# Patient Record
Sex: Male | Born: 1975 | Race: Black or African American | Hispanic: No | Marital: Married | Smoking: Current some day smoker
Health system: Southern US, Community
[De-identification: ages and names within clinical notes are randomized; demographics above are authoritative.]

## PROBLEM LIST (undated history)

## (undated) DIAGNOSIS — G709 Myoneural disorder, unspecified: Secondary | ICD-10-CM

## (undated) DIAGNOSIS — E119 Type 2 diabetes mellitus without complications: Secondary | ICD-10-CM

## (undated) DIAGNOSIS — F419 Anxiety disorder, unspecified: Secondary | ICD-10-CM

## (undated) DIAGNOSIS — Z8489 Family history of other specified conditions: Secondary | ICD-10-CM

## (undated) DIAGNOSIS — N189 Chronic kidney disease, unspecified: Secondary | ICD-10-CM

## (undated) DIAGNOSIS — I10 Essential (primary) hypertension: Secondary | ICD-10-CM

## (undated) DIAGNOSIS — G4730 Sleep apnea, unspecified: Secondary | ICD-10-CM

## (undated) DIAGNOSIS — I2699 Other pulmonary embolism without acute cor pulmonale: Secondary | ICD-10-CM

## (undated) HISTORY — DX: Other pulmonary embolism without acute cor pulmonale: I26.99

## (undated) HISTORY — DX: Type 2 diabetes mellitus without complications: E11.9

## (undated) HISTORY — PX: UMBILICAL HERNIA REPAIR: SHX196

---
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
## 2011-05-08 ENCOUNTER — Emergency Department: Payer: Self-pay | Admitting: Internal Medicine

## 2011-05-08 LAB — COMPREHENSIVE METABOLIC PANEL
Anion Gap: 8 (ref 7–16)
Calcium, Total: 10 mg/dL (ref 8.5–10.1)
Co2: 30 mmol/L (ref 21–32)
EGFR (Non-African Amer.): >60
Glucose: 128 mg/dL — ABNORMAL HIGH (ref 65–99)
Osmolality: 270 (ref 275–301)
Potassium: 3.8 mmol/L (ref 3.5–5.1)
SGPT (ALT): 273 U/L — ABNORMAL HIGH
Sodium: 135 mmol/L — ABNORMAL LOW (ref 136–145)

## 2011-05-08 LAB — CK TOTAL AND CKMB (NOT AT ARMC)
CK, Total: 388 U/L — ABNORMAL HIGH (ref 35–232)
CK-MB: 2.6 ng/mL (ref 0.5–3.6)

## 2011-05-08 LAB — CBC
MCH: 29.8 pg (ref 26.0–34.0)
MCHC: 32.7 g/dL (ref 32.0–36.0)
MCV: 91 fL (ref 80–100)
Platelet: 142 10*3/uL — ABNORMAL LOW (ref 150–440)
RDW: 13.7 % (ref 11.5–14.5)

## 2011-05-08 LAB — URINALYSIS, COMPLETE
Bacteria: NONE SEEN
Bilirubin,UR: NEGATIVE
Blood: NEGATIVE
Glucose,UR: NEGATIVE mg/dL (ref 0–75)
Hyaline Cast: 1
Ketone: NEGATIVE
Leukocyte Esterase: NEGATIVE
Nitrite: NEGATIVE
Protein: 100
Specific Gravity: 1.011 (ref 1.003–1.030)

## 2011-05-08 LAB — PROTIME-INR
INR: 1
Prothrombin Time: 13.9 secs (ref 11.5–14.7)

## 2011-05-08 LAB — DRUG SCREEN, URINE
Benzodiazepine, Ur Scrn: NEGATIVE (ref ?–200)
Cannabinoid 50 Ng, Ur ~~LOC~~: NEGATIVE (ref ?–50)
MDMA (Ecstasy)Ur Screen: NEGATIVE (ref ?–500)
Methadone, Ur Screen: NEGATIVE (ref ?–300)
Opiate, Ur Screen: NEGATIVE (ref ?–300)
Phencyclidine (PCP) Ur S: NEGATIVE (ref ?–25)

## 2011-05-08 LAB — TROPONIN I: Troponin-I: 0.03 ng/mL

## 2011-09-13 ENCOUNTER — Emergency Department: Payer: Self-pay | Admitting: *Deleted

## 2011-09-13 LAB — COMPREHENSIVE METABOLIC PANEL
BUN: 11 mg/dL (ref 7–18)
Bilirubin,Total: 0.5 mg/dL (ref 0.2–1.0)
Calcium, Total: 8.8 mg/dL (ref 8.5–10.1)
Chloride: 106 mmol/L (ref 98–107)
Co2: 25 mmol/L (ref 21–32)
Creatinine: 0.85 mg/dL (ref 0.60–1.30)
EGFR (African American): >60
EGFR (Non-African Amer.): >60
Glucose: 99 mg/dL (ref 65–99)
Potassium: 3.5 mmol/L (ref 3.5–5.1)
SGOT(AST): 134 U/L — ABNORMAL HIGH (ref 15–37)
SGPT (ALT): 216 U/L — ABNORMAL HIGH
Total Protein: 8.3 g/dL — ABNORMAL HIGH (ref 6.4–8.2)

## 2011-09-13 LAB — CBC
HCT: 46.1 % (ref 40.0–52.0)
HGB: 15.5 g/dL (ref 13.0–18.0)
MCHC: 33.5 g/dL (ref 32.0–36.0)
Platelet: 132 10*3/uL — ABNORMAL LOW (ref 150–440)
RBC: 4.98 10*6/uL (ref 4.40–5.90)
RDW: 13.6 % (ref 11.5–14.5)

## 2011-09-13 LAB — TROPONIN I: Troponin-I: 0.02 ng/mL

## 2012-03-01 ENCOUNTER — Emergency Department: Payer: Self-pay | Admitting: Emergency Medicine

## 2012-03-01 LAB — URINALYSIS, COMPLETE
Bacteria: NONE SEEN
Bilirubin,UR: NEGATIVE
Blood: NEGATIVE
Glucose,UR: NEGATIVE mg/dL (ref 0–75)
Leukocyte Esterase: NEGATIVE
RBC,UR: <1 /HPF (ref 0–5)
Squamous Epithelial: <1
WBC UR: <1 /HPF (ref 0–5)

## 2012-03-01 LAB — CBC
HGB: 14.2 g/dL (ref 13.0–18.0)
MCHC: 32.9 g/dL (ref 32.0–36.0)
MCV: 94 fL (ref 80–100)
Platelet: 123 10*3/uL — ABNORMAL LOW (ref 150–440)
RBC: 4.58 10*6/uL (ref 4.40–5.90)
RDW: 14.4 % (ref 11.5–14.5)

## 2012-03-01 LAB — COMPREHENSIVE METABOLIC PANEL
Alkaline Phosphatase: 74 U/L (ref 50–136)
BUN: 9 mg/dL (ref 7–18)
Bilirubin,Total: 0.3 mg/dL (ref 0.2–1.0)
Co2: 26 mmol/L (ref 21–32)
Creatinine: 1.18 mg/dL (ref 0.60–1.30)
EGFR (Non-African Amer.): >60
Osmolality: 291 (ref 275–301)
Potassium: 3.5 mmol/L (ref 3.5–5.1)
SGOT(AST): 163 U/L — ABNORMAL HIGH (ref 15–37)
SGPT (ALT): 181 U/L — ABNORMAL HIGH (ref 12–78)
Sodium: 146 mmol/L — ABNORMAL HIGH (ref 136–145)
Total Protein: 8.1 g/dL (ref 6.4–8.2)

## 2012-03-01 LAB — TSH: Thyroid Stimulating Horm: 3.22 u[IU]/mL

## 2012-03-01 LAB — PRO B NATRIURETIC PEPTIDE: B-Type Natriuretic Peptide: 10 pg/mL (ref 0–125)

## 2012-03-01 LAB — TROPONIN I
Troponin-I: 0.04 ng/mL
Troponin-I: 0.04 ng/mL

## 2012-03-10 ENCOUNTER — Ambulatory Visit: Payer: Self-pay | Admitting: Cardiology

## 2012-12-17 LAB — CBC AND DIFFERENTIAL
HCT: 43 % (ref 41–53)
HEMOGLOBIN: 14 g/dL (ref 13.5–17.5)
PLATELETS: 123 10*3/uL — AB (ref 150–399)
WBC: 4.8 10*3/mL

## 2012-12-17 LAB — BASIC METABOLIC PANEL
BUN: 8 mg/dL (ref 4–21)
Creatinine: 0.9 mg/dL (ref 0.6–1.3)
Glucose: 197 mg/dL
Potassium: 4 mmol/L (ref 3.4–5.3)
Sodium: 143 mmol/L (ref 137–147)

## 2012-12-17 LAB — LIPID PANEL
Cholesterol: 226 mg/dL — AB (ref 0–200)
HDL: 30 mg/dL — AB (ref 35–70)
LDL CALC: 156 mg/dL
TRIGLYCERIDES: 200 mg/dL — AB (ref 40–160)

## 2013-02-25 LAB — HEMOGLOBIN A1C: HEMOGLOBIN A1C: 6.1

## 2013-03-31 ENCOUNTER — Ambulatory Visit: Payer: Self-pay | Admitting: Anesthesiology

## 2013-03-31 LAB — BASIC METABOLIC PANEL
Anion Gap: 8 (ref 7–16)
BUN: 11 mg/dL (ref 7–18)
Calcium, Total: 10.1 mg/dL (ref 8.5–10.1)
Chloride: 99 mmol/L (ref 98–107)
Co2: 32 mmol/L (ref 21–32)
Creatinine: 0.83 mg/dL (ref 0.60–1.30)
EGFR (African American): >60
EGFR (Non-African Amer.): >60
Glucose: 141 mg/dL — ABNORMAL HIGH (ref 65–99)
Potassium: 3.3 mmol/L — ABNORMAL LOW (ref 3.5–5.1)

## 2013-04-05 ENCOUNTER — Ambulatory Visit: Payer: Self-pay | Admitting: Surgery

## 2013-07-22 ENCOUNTER — Ambulatory Visit: Payer: Self-pay | Admitting: Family Medicine

## 2014-01-19 ENCOUNTER — Emergency Department: Payer: Self-pay | Admitting: Emergency Medicine

## 2014-08-18 NOTE — Op Note (Signed)
PATIENT NAME:  Tobin, ItalyHAD A MR#:  [id number] DATE OF BIRTH:  05-16-1975  DATE OF PROCEDURE:  04/05/2013  PREOPERATIVE DIAGNOSIS: Umbilical hernia.   POSTOPERATIVE DIAGNOSIS: Umbilical hernia.   PROCEDURE: Umbilical hernia repair.   SURGEON: Renda RollsWilton Zavien Clubb, M.D.   ANESTHESIA: General.   INDICATIONS: This 39 year old male has noted increasing size of bulging at the navel over the past year. Has had some minor associated pain. A large umbilical hernia was demonstrated on physical exam and repair was recommended for definitive treatment.   DESCRIPTION OF PROCEDURE: The patient was placed on the operating table in the supine position under general anesthesia. The periumbilical skin was clipped and prepared with ChloraPrep and draped in a sterile manner.   The bulge itself was about 5 cm. A transversely oriented supraumbilical curvilinear incision was  made from approximately 9:30 position to 2:30 position and ellipse of skin was excised to help avoid redundancy. Several bleeding points were cauterized. Several traversing veins were suture ligated with 4-0 chromic. There was a large sac which was dissected free from surrounding structures and followed down to the fascial ring defect. The sac was opened and a finger was inserted to allow traction. There was some omentum within the sac which was dissected away and reduced. After separating the sac from the fascial ring defect the sac was suture ligated with 0 Surgilon. The sac was excised. It did not need to be sent for pathology, and the skin ellipse did not need to be sent for pathology. The Bard soft mesh was used and I cut out a portion of mesh to approximately 2.5 x 3.5 cm and placed transversely oriented in the properitoneal plane. The fascial defect itself was approximately 2 cm in dimension. The mesh was sutured to the fascia  above and below the fascial ring defect with a 0 Surgilon, and then the fascia was closed with a transversely oriented suture  line of interrupted 0 Surgilon simple and figure-of-eight sutures incorporating mesh into each suture. The repair looked good. Hemostasis was intact. Subcutaneous tissues were infiltrated with 0.5% Sensorcaine with epinephrine and also deeper  tissues and even into the fascia was infiltrated as well. Next, the skin of the umbilicus was sutured to the deep fascia with 4-0 chromic. The skin was closed with running 4-0 Monocryl subcuticular suture and Dermabond, and after the Dermabond dried I applied 4 x 4 gauze, 5 cotton balls, additional folded gauze and paper tape.   The patient appeared to tolerate the procedure satisfactorily and was prepared for transfer to the recovery room.    ____________________________ Shela CommonsJ. Renda RollsWilton Shyasia Funches, MD jws:sg D: 04/05/2013 09:18:11 ET T: 04/05/2013 10:02:44 ET JOB#: [id number]  cc: Adella HareJ. Wilton Samani Deal, MD, <Dictator> Adella HareWILTON J Nyree Yonker MD ELECTRONICALLY SIGNED 04/05/2013 19:29

## 2014-08-19 NOTE — Consult Note (Signed)
PATIENT NAME:  [name], [name] [name]#:  [id number] DATE OF BIRTH:  03-31-1976  DATE OF CONSULTATION:  04/05/2013  REFERRING PHYSICIAN:  Adella Hare, MD CONSULTING PHYSICIAN:  Katha Hamming, MD  PRIMARY DOCTOR:  [facility].   REASON FOR CONSULTATION:  Hypoxia.   HISTORY OF PRESENT ILLNESS: This 39 year old male patient had umbilical hernia surgery today but patient is having low oxygen saturations. Saturations are dropping to like 87% or 88% on room air on 2 liters sats are around 90% to 93% but the patient's sats are dropping to even like 90% when he talks. Denies any shortness of breath. No chest pain. No cough. No fever. The patient denies any history of heart problems. No history of COPD. The patient received breathing treatments after extubation in the OR and also received flumazenil as an antidote for benzodiazepine because of his low O2 sats. Seen in the recovery room, the patient slightly sedated, going to sleep while talking.  At that time, sats are dropping to like low 90s but when he is awake, when he takes deep breaths saturations are around 94% to  95% on 2 liters. Without oxygen, sats are 86% to 87%.  The patient does not want to stay for observation. Denies any complaints. Chest x-ray is clear. The patient's O2 sats were 93% to 94% on room air before the surgery.   PAST MEDICAL HISTORY:  Significant for hypertension.   ALLERGIES:  THE PATIENT IS ALLERGIC TO FRESH FRUITS, CAUSE NAUSEA AND THROAT TIGHTNESS.  HE SAYS HE IS ALLERGIC TO ALL FRESH FRUITS.  HE CAN TAKE CANNED FRUITS.  HE IS ALSO ALLERGIC TO PEANUTS.   SOCIAL HISTORY:  Smokes couple of cigarettes a day. No alcohol. No drug.   PAST SURGICAL HISTORY: Umbilical hernia surgery at this time.   FAMILY HISTORY: No asthma, mother had hypertension also.   SOCIAL HISTORY: As I mentioned, he has a 21-year-old son. Works here third shift.   REVIEW OF SYSTEMS: CONSTITUTIONAL: No fever. No fatigue.   GASTROINTESTINAL: No nausea. No vomiting. Has umbilical hernia surgery, complains of slight pain in the abdomen.  CARDIOVASCULAR: No chest pain or palpitations.  MUSCULOSKELETAL: No joint pains.  ENT: No tinnitus. No ear pain. No difficulty swallowing.  RESPIRATORY:  The patient denies any shortness of breath, never was evaluated for sleep apnea but the patient does admit to having episodes of apnea during the sleep.  NEUROLOGIC: No history of stroke.  PSYCHIATRIC: No anxiety or insomnia.   PHYSICAL EXAMINATION: VITAL SIGNS: Right now, heart rate is around 99, blood pressure 140/70, sats are like 93% to 94% on 2 liters.  GENERAL:  He is alert, awake, oriented, still kind of a little sleepy due to the sedation effect from anesthesia.  ENT: No tympanic membrane congestion. No turbinate hypertrophy. No pharyngeal erythema. Mucous membranes are dry.  CARDIOVASCULAR: S1, S2 regular, slightly tachycardic due to pain. The patient has good pedal pulse and carotid pulse.  LUNGS: Clear to auscultation. No wheeze. No rales.  GASTROINTESTINAL: The patient had umbilical hernia surgery, dressing present in abdomen.  EXTREMITIES: No extremity edema. No cyanosis, no clubbing.  NEUROLOGIC: Alert, awake, oriented. Cranial nerves II through XII are intact. Power 5/5 upper and lower extremities. Senses are intact. DTRs 2+ bilaterally.  PSYCHIATRIC: Mood and affect are within normal limits.   LABORATORY, DIAGNOSTIC AND RADIOLOGICAL DATA: Chest x-ray shows borderline enlargement of cardiopericardial silhouette without any congestion.  EKG normal sinus rhythm, 97 beats per minute, no ST-T changes.  Electrolytes: Sodium 139, potassium 3.3, chlorite is 99, bicarb 32, BUN 11, creatinine 0.83, glucose 141.   HOME MEDICATIONS: The patient is on chlorthalidone 25 mg p.o. daily, metoprolol 25 mg at bedtime and he is on Prilosec 20 mg.   CURRENT MEDICATIONS IN THE HOSPITAL: He is on IV fluids, Ringer's lactate at 40 and he  is also on Norco 5/325 q.4 hours p.r.n. and he is also on Zofran.   ASSESSMENT AND PLAN: The patient is a 39 year old male with hypoxia, status post recent intubation and general anesthesia for umbilical hernia repair. At this time, I do not suspect any congestive lung problem or heart problem causing hypoxia. I believe it is probably he is  recovering from medication effect and also recovering from general anesthesia. We are trying to see if he can get home oxygen set up at 2 liters by nasal cannula. The patient probably has underlying sleep apnea. That made him having hypoxic episodes during recovery period and he will benefit from sleep studies as an outpatient. I advised him [facility] once he recovers from umbilical hernia. The patient probably needs 2 liters of oxygen till he gets the appointment as his preop O2 saturations were also like 93% so it is unknown, but he probably might be having low oxygen saturations to begin with even before surgery but it is not documented and never was checked. Now, I think the low hypoxia is probably related to the medication effect and recovery  from general anesthesia and intubation. The patient's chest x-ray is clear. EKG looks normal sinus so, hopefully, with oxygen he can go home. Otherwise, he needs overnight observation see if the anesthesia effect wears off, if he maintains his O2 saturations above 90% on room air. The patient advised to use incentive spirometry and we can let him go home with incentive spirometry to use.  called back to recovery room and found out pt o2 sats improved to 95% with  out  o2 and he is discharged  TIME SPENT ON THE CONSULT: More than 60 minutes.   ____________________________ Katha HammingSnehalatha Millee Denise, MD sk:cs D: 04/05/2013 13:42:39 ET T: 04/05/2013 14:29:36 ET JOB#: [id number]  cc: Katha HammingSnehalatha Jasiel Belisle, MD, <Dictator> Katha HammingSNEHALATHA Marceil Welp MD ELECTRONICALLY SIGNED 04/30/2013  17:56

## 2014-09-15 ENCOUNTER — Encounter: Payer: Self-pay | Admitting: Emergency Medicine

## 2014-09-15 ENCOUNTER — Emergency Department: Payer: Self-pay

## 2014-09-15 ENCOUNTER — Emergency Department
Admission: EM | Admit: 2014-09-15 | Discharge: 2014-09-15 | Disposition: A | Discharge status: Home / Self Care | Payer: Self-pay | Attending: Emergency Medicine | Admitting: Emergency Medicine

## 2014-09-15 DIAGNOSIS — I10 Essential (primary) hypertension: Secondary | ICD-10-CM | POA: Insufficient documentation

## 2014-09-15 DIAGNOSIS — Z720 Tobacco use: Secondary | ICD-10-CM | POA: Insufficient documentation

## 2014-09-15 DIAGNOSIS — M25562 Pain in left knee: Secondary | ICD-10-CM | POA: Insufficient documentation

## 2014-09-15 HISTORY — DX: Essential (primary) hypertension: I10

## 2014-09-15 LAB — BASIC METABOLIC PANEL
Anion gap: 8 (ref 5–15)
BUN: 8 mg/dL (ref 6–20)
CALCIUM: 9.1 mg/dL (ref 8.9–10.3)
CO2: 30 mmol/L (ref 22–32)
Chloride: 102 mmol/L (ref 101–111)
Creatinine, Ser: 0.82 mg/dL (ref 0.61–1.24)
GFR calc non Af Amer: >60 mL/min (ref >60)
Glucose, Bld: 136 mg/dL — ABNORMAL HIGH (ref 65–99)
POTASSIUM: 3.5 mmol/L (ref 3.5–5.1)
Sodium: 140 mmol/L (ref 135–145)

## 2014-09-15 LAB — CBC
HEMATOCRIT: 44.1 % (ref 40.0–52.0)
Hemoglobin: 14.5 g/dL (ref 13.0–18.0)
MCH: 29.9 pg (ref 26.0–34.0)
MCHC: 32.9 g/dL (ref 32.0–36.0)
MCV: 90.8 fL (ref 80.0–100.0)
Platelets: 123 10*3/uL — ABNORMAL LOW (ref 150–440)
RBC: 4.86 MIL/uL (ref 4.40–5.90)
RDW: 14.4 % (ref 11.5–14.5)
WBC: 5.5 10*3/uL (ref 3.8–10.6)

## 2014-09-15 LAB — URIC ACID: Uric Acid, Serum: 6.6 mg/dL (ref 4.4–7.6)

## 2014-09-15 MED ORDER — KETOROLAC TROMETHAMINE 30 MG/ML IJ SOLN
30.0000 mg | Freq: Once | INTRAMUSCULAR | Status: AC
  Administered 2014-09-15: 30 mg via INTRAVENOUS

## 2014-09-15 MED ORDER — OXYCODONE-ACETAMINOPHEN 5-325 MG PO TABS: 1.0000 | ORAL_TABLET | ORAL | Status: DC | PRN

## 2014-09-15 MED ORDER — SODIUM CHLORIDE 0.9 % IV BOLUS (SEPSIS)
1000.0000 mL | Freq: Once | INTRAVENOUS | Status: AC
  Administered 2014-09-15: 1000 mL via INTRAVENOUS

## 2014-09-15 MED ORDER — ETODOLAC 400 MG PO TABS: 400.0000 mg | ORAL_TABLET | Freq: Two times a day (BID) | ORAL | Status: DC

## 2014-09-15 MED ORDER — KETOROLAC TROMETHAMINE 30 MG/ML IJ SOLN
INTRAMUSCULAR | Status: AC
  Administered 2014-09-15: 30 mg via INTRAVENOUS
  Filled 2014-09-15: qty 1

## 2014-09-15 MED ORDER — OXYCODONE-ACETAMINOPHEN 5-325 MG PO TABS
ORAL_TABLET | ORAL | Status: AC
  Administered 2014-09-15: 2 via ORAL
  Filled 2014-09-15: qty 2

## 2014-09-15 MED ORDER — OXYCODONE-ACETAMINOPHEN 5-325 MG PO TABS
2.0000 | ORAL_TABLET | Freq: Once | ORAL | Status: AC
  Administered 2014-09-15: 2 via ORAL

## 2014-09-15 NOTE — ED Notes (Signed)
C/o knee pain since yesterday am, denies any injury

## 2014-09-15 NOTE — ED Notes (Signed)
Pt given soda at this time

## 2014-09-15 NOTE — ED Provider Notes (Signed)
[facility] Emergency Department Provider Note  ____________________________________________  Time seen: 8:47 AM  I have reviewed the triage vital signs and the nursing notes.   HISTORY  Chief Complaint Knee Pain    HPI [name] Walker is a 39 y.o. male left knee pain since yesterday. He denies any history of injury. There is increased pain with standing slightly decreased pain with lying down. He took some over-the-counter ibuprofen without any relief. Currently his pain is 8 out of 10. He has not been aware of any fever or chills. He has had some problems with his knee in the past but no injury or surgery has been done. He also gives a history of gout but has not had any in his knee.   Past Medical History  Diagnosis Date  . Hypertension     There are no active problems to display for this patient.   No past surgical history on file.  Current Outpatient Rx  Name  Route  Sig  Dispense  Refill  . etodolac (LODINE) 400 MG tablet   Oral   Take 1 tablet (400 mg total) by mouth 2 (two) times daily.   20 tablet   0   . oxyCODONE-acetaminophen (PERCOCET) 5-325 MG per tablet   Oral   Take 1 tablet by mouth every 4 (four) hours as needed for severe pain.   20 tablet   0     Allergies Review of patient's allergies indicates no known allergies.  History reviewed. No pertinent family history.  Social History History  Substance Use Topics  . Smoking status: Current Some Day Smoker    Types: Cigarettes  . Smokeless tobacco: Not on file  . Alcohol Use: Yes    Review of Systems Constitutional: No fever/chills Eyes: No visual changes. ENT: No sore throat. Cardiovascular: Denies chest pain. Respiratory: Denies shortness of breath. Gastrointestinal: No abdominal pain.  No nausea, no vomiting.  No diarrhea.  Genitourinary: Negative for dysuria. Musculoskeletal: Negative for back pain. Skin: Negative for rash. Neurological: Negative for headaches,  focal weakness or numbness.  10-point ROS otherwise negative.  ____________________________________________   PHYSICAL EXAM:  VITAL SIGNS: ED Triage Vitals  Enc Vitals Group     BP 09/15/14 0758 169/115 mmHg     Pulse Rate 09/15/14 0758 125     Resp 09/15/14 0758 18     Temp 09/15/14 0758 99 F (37.2 C)     Temp Source 09/15/14 0758 Oral     SpO2 09/15/14 0758 95 %     Weight 09/15/14 0758 260 lb (117.935 kg)     Height 09/15/14 0758 5\' 8"  (1.727 m)     Head Cir --      Peak Flow --      Pain Score 09/15/14 0756 8     Pain Loc --      Pain Edu? --      Excl. in GC? --     Constitutional: Alert and oriented. Well appearing and in no acute distress. Eyes: Conjunctivae are normal. PERRL. EOMI. Head: Atraumatic. Nose: No congestion/rhinnorhea. Mouth/Throat: Mucous membranes are moist.  Neck: No stridor.   Cardiovascular: Normal rate, regular rhythm. Grossly normal heart sounds.  Good peripheral circulation. Respiratory: Normal respiratory effort.  No retractions. Lungs CTAB. Gastrointestinal: Soft and nontender. No distention Musculoskeletal: The left knee with moderate tenderness on palpation of the anterior aspect. Range of motion is restricted  secondary to pain. There is no erythema or abrasions noted. No effusion.  Neurologic:  Normal speech and language. No gross focal neurologic deficits are appreciated. Speech is normal. No gait instability. Skin:  Skin is warm, dry and intact. No rash noted. Psychiatric: Mood and affect are normal. Speech and behavior are normal.  ____________________________________________   LABS (all labs ordered are listed, but only abnormal results are displayed)  Labs Reviewed  CBC - Abnormal; Notable for the following:    Platelets 123 (*)    All other components within normal limits  BASIC METABOLIC PANEL - Abnormal; Notable for the following:    Glucose, Bld 136 (*)    All other components within normal limits  URIC ACID    RADIOLOGY  X-ray is negative per radiologist. ____________________________________________   PROCEDURES  Procedure(s) performed: None  Critical Care performed: No  ____________________________________________   INITIAL IMPRESSION / ASSESSMENT AND PLAN / ED COURSE  Pertinent labs & imaging results that were available during my care of the patient were reviewed by me and considered in my medical decision making (see chart for details).  Patient was given a set of crutches and also placed in a knee immobilizer. He was started on etodolac for 10 days along with a prescription for Percocet if needed for pain. He is to follow-up with Dr. Rosita KeaMenz if any continued problems. He was instructed to stay off his leg as much as possible this weekend. He is return to the emergency room if any severe worsening or urgent concerns. ____________________________________________   FINAL CLINICAL IMPRESSION(S) / ED DIAGNOSES  Final diagnoses:  Acute knee pain, left      Tommi RumpsRhonda L Summers, PA-C 09/15/14 1330  Tommi Rumpshonda L Summers, PA-C 09/15/14 1331  Governor Rooksebecca Lord, MD 09/15/14 [phone]

## 2015-02-13 ENCOUNTER — Emergency Department: Payer: Self-pay

## 2015-02-13 ENCOUNTER — Emergency Department
Admission: EM | Admit: 2015-02-13 | Discharge: 2015-02-13 | Disposition: A | Discharge status: Home / Self Care | Payer: Self-pay | Attending: Emergency Medicine | Admitting: Emergency Medicine

## 2015-02-13 DIAGNOSIS — Z791 Long term (current) use of non-steroidal anti-inflammatories (NSAID): Secondary | ICD-10-CM | POA: Insufficient documentation

## 2015-02-13 DIAGNOSIS — I10 Essential (primary) hypertension: Secondary | ICD-10-CM | POA: Insufficient documentation

## 2015-02-13 DIAGNOSIS — Z720 Tobacco use: Secondary | ICD-10-CM | POA: Insufficient documentation

## 2015-02-13 DIAGNOSIS — M10062 Idiopathic gout, left knee: Secondary | ICD-10-CM | POA: Insufficient documentation

## 2015-02-13 LAB — CBC
HCT: 49.5 % (ref 40.0–52.0)
Hemoglobin: 16.2 g/dL (ref 13.0–18.0)
MCH: 28.9 pg (ref 26.0–34.0)
MCHC: 32.8 g/dL (ref 32.0–36.0)
MCV: 88.1 fL (ref 80.0–100.0)
PLATELETS: 136 10*3/uL — AB (ref 150–440)
RBC: 5.61 MIL/uL (ref 4.40–5.90)
RDW: 14.2 % (ref 11.5–14.5)
WBC: 6.2 10*3/uL (ref 3.8–10.6)

## 2015-02-13 LAB — BASIC METABOLIC PANEL
Anion gap: 8 (ref 5–15)
BUN: 11 mg/dL (ref 6–20)
CO2: 31 mmol/L (ref 22–32)
CREATININE: 0.79 mg/dL (ref 0.61–1.24)
Calcium: 10 mg/dL (ref 8.9–10.3)
Chloride: 100 mmol/L — ABNORMAL LOW (ref 101–111)
GFR calc Af Amer: >60 mL/min (ref >60)
Glucose, Bld: 106 mg/dL — ABNORMAL HIGH (ref 65–99)
Potassium: 3.6 mmol/L (ref 3.5–5.1)
Sodium: 139 mmol/L (ref 135–145)

## 2015-02-13 LAB — URIC ACID: URIC ACID, SERUM: 8.8 mg/dL — AB (ref 4.4–7.6)

## 2015-02-13 MED ORDER — KETOROLAC TROMETHAMINE 30 MG/ML IJ SOLN
30.0000 mg | Freq: Once | INTRAMUSCULAR | Status: AC
  Administered 2015-02-13: 30 mg via INTRAVENOUS
  Filled 2015-02-13: qty 1

## 2015-02-13 MED ORDER — HYDROMORPHONE HCL 1 MG/ML IJ SOLN
1.0000 mg | Freq: Once | INTRAMUSCULAR | Status: AC
  Administered 2015-02-13: 1 mg via INTRAVENOUS
  Filled 2015-02-13: qty 1

## 2015-02-13 MED ORDER — NAPROXEN 500 MG PO TABS: 500.0000 mg | ORAL_TABLET | Freq: Two times a day (BID) | ORAL | Status: AC

## 2015-02-13 MED ORDER — OXYCODONE-ACETAMINOPHEN 5-325 MG PO TABS: 1.0000 | ORAL_TABLET | ORAL | Status: AC | PRN

## 2015-02-13 NOTE — ED Notes (Signed)
Pt presents to ED with c/o left knee pain since 02/09/15, denies known injury to left knee. Pt reports pain subsided during the weekend but has increased this morning. Reports stood up to walk to bathroom this morning but was unable to bare weight on left knee, states "it was extremely painful." Pt reports seen about 6 months ago for same but was d/c with knee brace and ibuprofen, was not give diagnosis. (+) swelling noted to left knee, (+) movement and sensation. Patient alert and oriented x 4, respirations even and unlabored, skin warm and dry.

## 2015-02-13 NOTE — ED Notes (Addendum)
Pt to triage via w/c with no distress noted; c/o left knee pain/swelling; st awoke last Friday am with such; denies any known injury; st seen here for same with no dx

## 2015-02-13 NOTE — ED Provider Notes (Signed)
[facility] Emergency Department Provider Note  ____________________________________________  Time seen: Approximately 7:10 AM  I have reviewed the triage vital signs and the nursing notes.   HISTORY  Chief Complaint Knee Pain    HPI [place] A Glance is a 39 y.o. male with a history of gout in the foot remotely presenting with 4 days of left knee pain and swelling. Patient reports no trauma, pain started 4 days ago. He has pain with ambulation but is able to bear weight. He denies any systemic symptoms including fever, nausea or vomiting, diarrhea. He denies penile discharge or IV drug abuse.He denies numbness tickling or weakness.   Past Medical History  Diagnosis Date  . Hypertension     There are no active problems to display for this patient.   No past surgical history on file.  Current Outpatient Rx  Name  Route  Sig  Dispense  Refill  . etodolac (LODINE) 400 MG tablet   Oral   Take 1 tablet (400 mg total) by mouth 2 (two) times daily.   20 tablet   0   . naproxen (NAPROSYN) 500 MG tablet   Oral   Take 1 tablet (500 mg total) by mouth 2 (two) times daily with a meal.   40 tablet   0   . oxyCODONE-acetaminophen (ROXICET) 5-325 MG tablet   Oral   Take 1 tablet by mouth every 4 (four) hours as needed for moderate pain.   20 tablet   0     Allergies Review of patient's allergies indicates no known allergies.  No family history on file.  Social History Social History  Substance Use Topics  . Smoking status: Current Some Day Smoker    Types: Cigarettes  . Smokeless tobacco: Not on file  . Alcohol Use: Yes    Review of Systems Constitutional: No fever/chills. No lightheadedness or syncope. Eyes: No visual changes. ENT: No sore throat. Cardiovascular: Denies chest pain, palpitations. Respiratory: Denies shortness of breath.  No cough. Gastrointestinal: No abdominal pain.  No nausea, no vomiting.  No diarrhea.  No  constipation. Genitourinary: Negative for dysuria. Negative for penile discharge Musculoskeletal: Negative for back pain. Positive for left knee pain and swelling. Negative for knee erythema. Skin: Negative for rash. Neurological: Negative for headaches, focal weakness or numbness.  10-point ROS otherwise negative.  ____________________________________________   PHYSICAL EXAM:  VITAL SIGNS: ED Triage Vitals  Enc Vitals Group     BP 02/13/15 0438 138/94 mmHg     Pulse Rate 02/13/15 0438 120     Resp 02/13/15 0438 18     Temp 02/13/15 0438 97.9 F (36.6 C)     Temp Source 02/13/15 0438 Oral     SpO2 02/13/15 0438 97 %     Weight 02/13/15 0438 270 lb (122.471 kg)     Height 02/13/15 0438  (1.727 m)     Head Cir --      Peak Flow --      Pain Score 02/13/15 0438 8     Pain Loc --      Pain Edu? --      Excl. in GC? --     Constitutional: Alert and oriented. Well appearing and in no acute distress. Answer question appropriately. Eyes: Conjunctivae are normal.  EOMI. Head: Atraumatic. Nose: No congestion/rhinnorhea. Mouth/Throat: Mucous membranes are moist.  Neck: No stridor.  Supple.   Cardiovascular: Normal rate, regular rhythm. No murmurs, rubs or gallops.  Respiratory: Normal respiratory  effort.  No retractions. Lungs CTAB.  No wheezes, rales or ronchi. Musculoskeletal: Left knee has a moderate effusion with overlying warmth but no erythema. He has full range of motion of the left ankle and hip without pain. He has pain with range of motion of the left knee. Neurologic:  Normal speech and language. No gross focal neurologic deficits are appreciated.  Skin:  Skin is warm, dry and intact. No rash noted. Psychiatric: Mood and affect are normal. Speech and behavior are normal.  Normal judgement.  ____________________________________________   LABS (all labs ordered are listed, but only abnormal results are displayed)  Labs Reviewed  CBC - Abnormal; Notable for the  following:    Platelets 136 (*)    All other components within normal limits  BASIC METABOLIC PANEL - Abnormal; Notable for the following:    Chloride 100 (*)    Glucose, Bld 106 (*)    All other components within normal limits  URIC ACID - Abnormal; Notable for the following:    Uric Acid, Serum 8.8 (*)    All other components within normal limits   ____________________________________________  EKG  Not indicated ____________________________________________  RADIOLOGY  Dg Knee Complete 4 Views Left  02/13/2015  CLINICAL DATA:  Acute onset of left knee pain and swelling. Unable to bear weight without pain. Initial encounter. EXAM: LEFT KNEE - COMPLETE 4+ VIEW COMPARISON:  None. FINDINGS: There is no evidence of fracture or dislocation. The joint spaces are preserved. No significant degenerative change is seen; the patellofemoral joint is grossly unremarkable in appearance. A relatively large knee joint effusion is noted. The visualized soft tissues are normal in appearance. IMPRESSION: 1. No evidence of fracture or dislocation. 2. Relatively large knee joint effusion noted. If the patient's symptoms persist, MRI would be helpful for further evaluation, to assess for internal derangement. Electronically Signed   By: Roanna Raider M.D.   On: 02/13/2015 05:43    ____________________________________________   PROCEDURES  Procedure(s) performed: None  Critical Care performed: No ____________________________________________   INITIAL IMPRESSION / ASSESSMENT AND PLAN / ED COURSE  Pertinent labs & imaging results that were available during my care of the patient were reviewed by me and considered in my medical decision making (see chart for details).  40 y.o. male with a history of gout presenting with left knee swelling, pain, and warmth for 4 days. On my exam, the patient is afebrile and nontoxic appearing. He does have pain with range of motion of the knee but is able to bear  weight. The most likely diagnosis is gout of the left knee. Consider a ligamentous or meniscal related effusion but he has no trauma that would support this. Consider osteoarthritis especially given his morbid obesity. His symptoms at this time are not consistent with septic arthritis.  ----------------------------------------- 8:42 AM on 02/13/2015 -----------------------------------------  Patient per pain has significantly improved. He arrived and pain was 9 out of 10; it is now 3 out of 10. He does not have an elevated white blood cell count. I am awaiting the rest of his labs but if they are also reassuring I will plan to send him home with treatment for acute gout flare. We have discussed return precautions and follow-up instructions. ____________________________________________  FINAL CLINICAL IMPRESSION(S) / ED DIAGNOSES  Final diagnoses:  Acute idiopathic gout of left knee      NEW MEDICATIONS STARTED DURING THIS VISIT:  New Prescriptions   NAPROXEN (NAPROSYN) 500 MG TABLET    Take 1 tablet (  500 mg total) by mouth 2 (two) times daily with a meal.   OXYCODONE-ACETAMINOPHEN (ROXICET) 5-325 MG TABLET    Take 1 tablet by mouth every 4 (four) hours as needed for moderate pain.     Rockne MenghiniAnne-Caroline Indiana Gamero, MD 02/13/15 [phone]

## 2015-02-13 NOTE — ED Notes (Signed)
NAD noted at this time. Pt taken to lobby via wheelchair by his wife. Pt denies comments/conerns. MD aware of pt's BP.

## 2015-02-13 NOTE — Discharge Instructions (Signed)
Please return to the emergency department if you develop increased swelling, pain, fever, nausea or vomiting, inability to walk, or any other symptoms concerning to you.  Gout Gout is an inflammatory arthritis caused by a buildup of uric acid crystals in the joints. Uric acid is a chemical that is normally present in the blood. When the level of uric acid in the blood is too high it can form crystals that deposit in your joints and tissues. This causes joint redness, soreness, and swelling (inflammation). Repeat attacks are common. Over time, uric acid crystals can form into masses (tophi) near a joint, destroying bone and causing disfigurement. Gout is treatable and often preventable. CAUSES  The disease begins with elevated levels of uric acid in the blood. Uric acid is produced by your body when it breaks down a naturally found substance called purines. Certain foods you eat, such as meats and fish, contain high amounts of purines. Causes of an elevated uric acid level include:  Being passed down from parent to child (heredity).  Diseases that cause increased uric acid production (such as obesity, psoriasis, and certain cancers).  Excessive alcohol use.  Diet, especially diets rich in meat and seafood.  Medicines, including certain cancer-fighting medicines (chemotherapy), water pills (diuretics), and aspirin.  Chronic kidney disease. The kidneys are no longer able to remove uric acid well.  Problems with metabolism. Conditions strongly associated with gout include:  Obesity.  High blood pressure.  High cholesterol.  Diabetes. Not everyone with elevated uric acid levels gets gout. It is not understood why some people get gout and others do not. Surgery, joint injury, and eating too much of certain foods are some of the factors that can lead to gout attacks. SYMPTOMS   An attack of gout comes on quickly. It causes intense pain with redness, swelling, and warmth in a joint.  Fever  can occur.  Often, only one joint is involved. Certain joints are more commonly involved:  Base of the big toe.  Knee.  Ankle.  Wrist.  Finger. Without treatment, an attack usually goes away in a few days to weeks. Between attacks, you usually will not have symptoms, which is different from many other forms of arthritis. DIAGNOSIS  Your caregiver will suspect gout based on your symptoms and exam. In some cases, tests may be recommended. The tests may include:  Blood tests.  Urine tests.  X-rays.  Joint fluid exam. This exam requires a needle to remove fluid from the joint (arthrocentesis). Using a microscope, gout is confirmed when uric acid crystals are seen in the joint fluid. TREATMENT  There are two phases to gout treatment: treating the sudden onset (acute) attack and preventing attacks (prophylaxis).  Treatment of an Acute Attack.  Medicines are used. These include anti-inflammatory medicines or steroid medicines.  An injection of steroid medicine into the affected joint is sometimes necessary.  The painful joint is rested. Movement can worsen the arthritis.  You may use warm or cold treatments on painful joints, depending which works best for you.  Treatment to Prevent Attacks.  If you suffer from frequent gout attacks, your caregiver may advise preventive medicine. These medicines are started after the acute attack subsides. These medicines either help your kidneys eliminate uric acid from your body or decrease your uric acid production. You may need to stay on these medicines for a very long time.  The early phase of treatment with preventive medicine can be associated with an increase in acute gout attacks. For  this reason, during the first few months of treatment, your caregiver may also advise you to take medicines usually used for acute gout treatment. Be sure you understand your caregiver's directions. Your caregiver may make several adjustments to your medicine  dose before these medicines are effective.  Discuss dietary treatment with your caregiver or dietitian. Alcohol and drinks high in sugar and fructose and foods such as meat, poultry, and seafood can increase uric acid levels. Your caregiver or dietitian can advise you on drinks and foods that should be limited. HOME CARE INSTRUCTIONS   Do not take aspirin to relieve pain. This raises uric acid levels.  Only take over-the-counter or prescription medicines for pain, discomfort, or fever as directed by your caregiver.  Rest the joint as much as possible. When in bed, keep sheets and blankets off painful areas.  Keep the affected joint raised (elevated).  Apply warm or cold treatments to painful joints. Use of warm or cold treatments depends on which works best for you.  Use crutches if the painful joint is in your leg.  Drink enough fluids to keep your urine clear or pale yellow. This helps your body get rid of uric acid. Limit alcohol, sugary drinks, and fructose drinks.  Follow your dietary instructions. Pay careful attention to the amount of protein you eat. Your daily diet should emphasize fruits, vegetables, whole grains, and fat-free or low-fat milk products. Discuss the use of coffee, vitamin C, and cherries with your caregiver or dietitian. These may be helpful in lowering uric acid levels.  Maintain a healthy body weight. SEEK MEDICAL CARE IF:   You develop diarrhea, vomiting, or any side effects from medicines.  You do not feel better in 24 hours, or you are getting worse. SEEK IMMEDIATE MEDICAL CARE IF:   Your joint becomes suddenly more tender, and you have chills or a fever. MAKE SURE YOU:   Understand these instructions.  Will watch your condition.  Will get help right away if you are not doing well or get worse.   This information is not intended to replace advice given to you by your health care provider. Make sure you discuss any questions you have with your health  care provider.   Document Released: 04/11/2000 Document Revised: 05/05/2014 Document Reviewed: 11/26/2011 Elsevier Interactive Patient Education [employer].

## 2015-10-31 DIAGNOSIS — I10 Essential (primary) hypertension: Secondary | ICD-10-CM | POA: Insufficient documentation

## 2015-10-31 DIAGNOSIS — E785 Hyperlipidemia, unspecified: Secondary | ICD-10-CM | POA: Insufficient documentation

## 2015-10-31 DIAGNOSIS — E1159 Type 2 diabetes mellitus with other circulatory complications: Secondary | ICD-10-CM | POA: Insufficient documentation

## 2015-10-31 DIAGNOSIS — E119 Type 2 diabetes mellitus without complications: Secondary | ICD-10-CM

## 2015-10-31 DIAGNOSIS — I152 Hypertension secondary to endocrine disorders: Secondary | ICD-10-CM | POA: Insufficient documentation

## 2015-10-31 DIAGNOSIS — Q792 Exomphalos: Secondary | ICD-10-CM | POA: Insufficient documentation

## 2015-10-31 DIAGNOSIS — E1121 Type 2 diabetes mellitus with diabetic nephropathy: Secondary | ICD-10-CM | POA: Insufficient documentation

## 2015-10-31 DIAGNOSIS — E1122 Type 2 diabetes mellitus with diabetic chronic kidney disease: Secondary | ICD-10-CM | POA: Insufficient documentation

## 2015-10-31 DIAGNOSIS — K429 Umbilical hernia without obstruction or gangrene: Secondary | ICD-10-CM | POA: Insufficient documentation

## 2015-10-31 DIAGNOSIS — G4733 Obstructive sleep apnea (adult) (pediatric): Secondary | ICD-10-CM | POA: Insufficient documentation

## 2016-05-05 ENCOUNTER — Ambulatory Visit (INDEPENDENT_AMBULATORY_CARE_PROVIDER_SITE_OTHER): Payer: BC Managed Care – PPO | Admitting: Family Medicine

## 2016-05-05 ENCOUNTER — Encounter: Payer: Self-pay | Admitting: Family Medicine

## 2016-05-05 VITALS — BP 130/90 | HR 114 | Temp 98.2°F | Resp 16 | Wt 301.4 lb

## 2016-05-05 DIAGNOSIS — K429 Umbilical hernia without obstruction or gangrene: Secondary | ICD-10-CM | POA: Diagnosis not present

## 2016-05-05 DIAGNOSIS — E782 Mixed hyperlipidemia: Secondary | ICD-10-CM

## 2016-05-05 DIAGNOSIS — M545 Low back pain, unspecified: Secondary | ICD-10-CM

## 2016-05-05 DIAGNOSIS — R351 Nocturia: Secondary | ICD-10-CM

## 2016-05-05 DIAGNOSIS — R739 Hyperglycemia, unspecified: Secondary | ICD-10-CM | POA: Diagnosis not present

## 2016-05-05 DIAGNOSIS — G8929 Other chronic pain: Secondary | ICD-10-CM | POA: Diagnosis not present

## 2016-05-05 DIAGNOSIS — F419 Anxiety disorder, unspecified: Secondary | ICD-10-CM | POA: Diagnosis not present

## 2016-05-05 MED ORDER — BUSPIRONE HCL 15 MG PO TABS: ORAL_TABLET | ORAL | 1 refills | Status: DC

## 2016-05-05 NOTE — Progress Notes (Signed)
Subjective:     Patient ID: [name] Walker, male   DOB: April 16, 1976, 41 y.o.   MRN: [id number]  HPI  Chief Complaint  Patient presents with  . Back Pain    Patient comes in office today with concerns of lumbar pain for the past several weeks. Patient states that he also believes that his hernia has returned. Patient states he had surgery on his hernia before but was unsuccessful  . Anxiety    Patient would like to address symptoms of anxiety for the past 6 months. Patient states that he recently Radiographer, therapeuticgraduated barber school and is still adjusting to being in shop. Patient also reports that he has symptoms of anxiety when driving.   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 Reports anxiety sx (shortness of breath/sweating) while cutting hair and when driving at times. Has not been on medication for depression or anxiety in the past. Has a newborn along with a 39 year old. He is accompanied by his wife today. He has been treating his back pain/stiffness/spasms with ibuprofen 600 mg.twice daily and Tylenol.   Review of Systems  Respiratory:       States he is seeing an ENT this week for snoring and pauses in breathing while sleeping. Admits to daytime drowsiness.  Gastrointestinal:       Painless umbilical hernia.  Genitourinary:       Reports nocturia up to 6 x night.  Musculoskeletal:       Hx of mild L1 compression fx after a fall in October of last year. CT scan also demonstrated arthritic changes and posterior disc herniations at L3-L4 and L4-L5.  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 No distress.  Abdominal:  small non-tender protruding umbilical hernia.  Musculoskeletal:  Muscle strength in lower extremities 5/5. SLR's to 90 degrees without radiation of back pain. Localizes pain across his bilateral lumbar area.        Assessment:    1. Chronic bilateral low back pain without sciatica  2. Umbilical hernia without obstruction and without gangrene  3. Acute anxiety - busPIRone (BUSPAR) 15 MG tablet; 1/2  Pill twice daily for a week then a whole pill twice daily.  Dispense: 30 tablet; Refill: 1  4. Blood glucose elevated - Comprehensive metabolic panel  5. Mixed hyperlipidemia - Lipid panel  6. Nocturia - PSA    Plan:    Discussed use of nsaid's and Tylenol. Further f/u pending lab results and in two weeks .Discussed walking 30 minutes daily for weight management and to ameliorate many of his medical problems.

## 2016-05-05 NOTE — Patient Instructions (Addendum)
We will call you with the lab results. Use ibuprofen 600 mg. 3 x day with food. May add Tylenol up to 3000 mg./day.

## 2016-05-08 LAB — COMPREHENSIVE METABOLIC PANEL
ALK PHOS: 79 IU/L (ref 39–117)
ALT: 31 IU/L (ref 0–44)
AST: 29 IU/L (ref 0–40)
Albumin/Globulin Ratio: 1.3 (ref 1.2–2.2)
Albumin: 4.3 g/dL (ref 3.5–5.5)
BILIRUBIN TOTAL: 0.6 mg/dL (ref 0.0–1.2)
BUN/Creatinine Ratio: 12 (ref 9–20)
BUN: 11 mg/dL (ref 6–20)
CO2: 28 mmol/L (ref 18–29)
CREATININE: 0.9 mg/dL (ref 0.76–1.27)
Calcium: 9.5 mg/dL (ref 8.7–10.2)
Chloride: 98 mmol/L (ref 96–106)
GFR calc Af Amer: 125 mL/min/{1.73_m2} (ref >59)
GFR calc non Af Amer: 108 mL/min/{1.73_m2} (ref >59)
GLOBULIN, TOTAL: 3.4 g/dL (ref 1.5–4.5)
Glucose: 174 mg/dL — ABNORMAL HIGH (ref 65–99)
POTASSIUM: 3.9 mmol/L (ref 3.5–5.2)
SODIUM: 141 mmol/L (ref 134–144)
Total Protein: 7.7 g/dL (ref 6.0–8.5)

## 2016-05-08 LAB — PSA: Prostate Specific Ag, Serum: 1.6 ng/mL (ref 0.0–4.0)

## 2016-05-08 LAB — LIPID PANEL
CHOLESTEROL TOTAL: 207 mg/dL — AB (ref 100–199)
Chol/HDL Ratio: 5.2 ratio units — ABNORMAL HIGH (ref 0.0–5.0)
HDL: 40 mg/dL (ref >39)
LDL Calculated: 149 mg/dL — ABNORMAL HIGH (ref 0–99)
TRIGLYCERIDES: 92 mg/dL (ref 0–149)
VLDL Cholesterol Cal: 18 mg/dL (ref 5–40)

## 2016-05-19 ENCOUNTER — Ambulatory Visit (INDEPENDENT_AMBULATORY_CARE_PROVIDER_SITE_OTHER): Payer: BC Managed Care – PPO | Admitting: Family Medicine

## 2016-05-19 ENCOUNTER — Encounter: Payer: Self-pay | Admitting: Family Medicine

## 2016-05-19 VITALS — BP 118/84 | HR 125 | Temp 98.8°F | Resp 17 | Wt 307.0 lb

## 2016-05-19 DIAGNOSIS — R0683 Snoring: Secondary | ICD-10-CM | POA: Diagnosis not present

## 2016-05-19 DIAGNOSIS — R739 Hyperglycemia, unspecified: Secondary | ICD-10-CM

## 2016-05-19 LAB — POCT GLYCOSYLATED HEMOGLOBIN (HGB A1C)

## 2016-05-19 NOTE — Progress Notes (Signed)
Subjective:     Patient ID: [place]had A Rebel, male   DOB: [birth year], 41 y.o.   MRN: [id number]  HPI  Chief Complaint  Patient presents with  . Anxiety    Patient comes in office today for 2 week follow up, last office visit 05/05/16 patient was started on Buspar 15mg . Patient states that he is now taking a whole tablet and has noticed improvement especially in work environment staying more focused.   . Abnormal Lab    Last office visit 05/05/16 Lipid Panel and CMP panel showed elevated labs in total cholesterol, LDL cholesterol and glucouse high of 174.  . Nicotine Dependence    Patient would like to discuss quitting smoking due to shortness of breath.   Reports occasional social smoking. Pending ENT appointment for snoring/possible sleep apnea. Has thought about returning to the gym but was concerned about his umbilical hernia limiting his activity. "I put on weight during my wife's pregnancies."   Review of Systems     Objective:   Physical Exam  Constitutional: He appears well-developed and well-nourished. No distress.       Assessment:    1. Blood glucose elevated - POCT glycosylated hemoglobin (Hb A1C)  2. Snores    Plan:    Provided with handout regarding [facility]di[facility] class [facility]. Encouraged 30 minutes of aerobic exercise. Suggested 10% weight loss as his weight gain is driving most of his medical problems. F/u with ENT. Stop social smoking but continue buspirone.

## 2016-05-19 NOTE — Patient Instructions (Signed)
Do go to the pre-diabetes class. Start exercise program (walking 30 minutes daily/cardio). Call ear nose and throat about your appointment. Stop social smoking but continue medication for anxiety.

## 2016-05-21 ENCOUNTER — Ambulatory Visit: Payer: BC Managed Care – PPO | Attending: Otolaryngology

## 2016-05-21 DIAGNOSIS — G4733 Obstructive sleep apnea (adult) (pediatric): Secondary | ICD-10-CM | POA: Insufficient documentation

## 2016-05-21 DIAGNOSIS — R0683 Snoring: Secondary | ICD-10-CM | POA: Insufficient documentation

## 2016-06-06 ENCOUNTER — Telehealth: Payer: Self-pay | Admitting: Family Medicine

## 2016-06-06 NOTE — Telephone Encounter (Signed)
Pt has been x 2 days. Cough (mostly dry) and sore throat Wife has not checked temperature, and pt has not c/o chills, sweats or body aches. Please review. Allene DillonEmily Drozdowski, CMA

## 2016-06-06 NOTE — Telephone Encounter (Signed)
Pt's wife called thinking maybe her husband might have the flu per what ENT told her. Marland Kitchen. He is on a cpap machine.  They called the ENT and they suggested that he call us.  He has sore throat, eyes reds.   Left eye especially, no fever.  We have no appts.  Please advise [phone]   Thank sTeri

## 2016-06-06 NOTE — Telephone Encounter (Signed)
Discussed with his wife application of warm eye compresses and use of Mucinex D and Delsym. Will f/u if not improving over the next few days. Discussed Saturday clinic.

## 2016-08-05 ENCOUNTER — Encounter: Payer: Self-pay | Admitting: Family Medicine

## 2016-08-05 ENCOUNTER — Ambulatory Visit (INDEPENDENT_AMBULATORY_CARE_PROVIDER_SITE_OTHER): Payer: BC Managed Care – PPO | Admitting: Family Medicine

## 2016-08-05 VITALS — BP 160/100 | HR 64 | Temp 98.5°F | Resp 16 | Ht 69.0 in | Wt 296.0 lb

## 2016-08-05 DIAGNOSIS — M7061 Trochanteric bursitis, right hip: Secondary | ICD-10-CM

## 2016-08-05 DIAGNOSIS — F419 Anxiety disorder, unspecified: Secondary | ICD-10-CM | POA: Diagnosis not present

## 2016-08-05 DIAGNOSIS — M545 Low back pain, unspecified: Secondary | ICD-10-CM | POA: Insufficient documentation

## 2016-08-05 DIAGNOSIS — G8929 Other chronic pain: Secondary | ICD-10-CM | POA: Diagnosis not present

## 2016-08-05 MED ORDER — BUSPIRONE HCL 15 MG PO TABS: ORAL_TABLET | ORAL | 5 refills | Status: DC

## 2016-08-05 MED ORDER — HYDROCODONE-ACETAMINOPHEN 5-325 MG PO TABS: ORAL_TABLET | ORAL | 0 refills | Status: DC

## 2016-08-05 MED ORDER — PREDNISONE 10 MG PO TABS: ORAL_TABLET | ORAL | 0 refills | Status: DC

## 2016-08-05 NOTE — Progress Notes (Signed)
Subjective:     Patient ID: [name] Walker, male   DOB: 04-20-1976, 41 y.o.   MRN: [id number]  HPI  Chief Complaint  Patient presents with  . Back Pain    pt c/o back and hip pain times several weeks. Patient reports symptoms have been present since fracture in 02/2016. Patient reports he is taking Ibuprofen 600 mg BID, reports mild to moderate improvement.  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 He has been using C-Pap per recent sleep study. Bp at last office visit was normal so suspect elevation today is pain induced. Accompanied by his wife today.   Review of Systems  Psychiatric/Behavioral:       States buspirone helped him and he wishes to have it refilled. Was prescribed Ambien by Dr. Elenore Rota.       Objective:   Physical Exam  Constitutional: He appears well-developed and well-nourished. No distress.  Musculoskeletal:  Muscle strength in lower extremities 5/5. SLR's to 60 degrees (hamstring tightness) without radiation of back pain.Tender over his right trochanteric bursa area.       Assessment:    1. Trochanteric bursitis of right hip - predniSONE (DELTASONE) 10 MG tablet; Taper daily as follows: 6 pills, 5, 4, 3, 2, 1  Dispense: 21 tablet; Refill: 0 - HYDROcodone-acetaminophen (NORCO/VICODIN) 5-325 MG tablet; One every 4-6 hours as needed for pain. [place] CSRS reviewed.  Dispense: 20 tablet; Refill: 0 - Ambulatory referral to Orthopedic Surgery  2. Acute anxiety - busPIRone (BUSPAR) 15 MG tablet; One pill twice daily  Dispense: 60 tablet; Refill: 5  3. Chronic bilateral low back pain without sciatica - HYDROcodone-acetaminophen (NORCO/VICODIN) 5-325 MG tablet; One every 4-6 hours as needed for pain.  CSRS reviewed.  Dispense: 20 tablet; Refill: 0 - Ambulatory referral to Orthopedic Surgery    Plan:    Further f/u in 3 weeks. Will check A1C at that time. Discussed not taking Ambien and hydrocodone  together.

## 2016-08-05 NOTE — Patient Instructions (Signed)
We will call you with the orthopedic referral.

## 2016-08-18 ENCOUNTER — Ambulatory Visit: Payer: BC Managed Care – PPO | Admitting: Family Medicine

## 2016-08-18 DIAGNOSIS — M25559 Pain in unspecified hip: Secondary | ICD-10-CM | POA: Insufficient documentation

## 2016-08-18 DIAGNOSIS — M5416 Radiculopathy, lumbar region: Secondary | ICD-10-CM | POA: Insufficient documentation

## 2016-09-08 ENCOUNTER — Encounter: Payer: Self-pay | Admitting: Family Medicine

## 2016-09-08 ENCOUNTER — Ambulatory Visit (INDEPENDENT_AMBULATORY_CARE_PROVIDER_SITE_OTHER): Payer: BC Managed Care – PPO | Admitting: Family Medicine

## 2016-09-08 VITALS — BP 144/112 | HR 122 | Temp 98.6°F | Resp 17 | Wt 311.4 lb

## 2016-09-08 DIAGNOSIS — I10 Essential (primary) hypertension: Secondary | ICD-10-CM

## 2016-09-08 DIAGNOSIS — F419 Anxiety disorder, unspecified: Secondary | ICD-10-CM

## 2016-09-08 DIAGNOSIS — R0789 Other chest pain: Secondary | ICD-10-CM

## 2016-09-08 DIAGNOSIS — E782 Mixed hyperlipidemia: Secondary | ICD-10-CM | POA: Diagnosis not present

## 2016-09-08 DIAGNOSIS — M545 Low back pain: Secondary | ICD-10-CM | POA: Diagnosis not present

## 2016-09-08 DIAGNOSIS — G8929 Other chronic pain: Secondary | ICD-10-CM

## 2016-09-08 MED ORDER — HYDROCHLOROTHIAZIDE 25 MG PO TABS: 25.0000 mg | ORAL_TABLET | Freq: Every day | ORAL | 3 refills | Status: DC

## 2016-09-08 NOTE — Patient Instructions (Addendum)
Fat and Cholesterol Restricted Diet Getting too much fat and cholesterol in your diet may cause health problems. Following this diet helps keep your fat and cholesterol at normal levels. This can keep you from getting sick. What types of fat should I choose?  Choose monosaturated and polyunsaturated fats. These are found in foods such as olive oil, canola oil, flaxseeds, walnuts, almonds, and seeds.  Eat more omega-3 fats. Good choices include salmon, mackerel, sardines, tuna, flaxseed oil, and ground flaxseeds.  Limit saturated fats. These are in animal products such as meats, butter, and cream. They can also be in plant products such as palm oil, palm kernel oil, and coconut oil.  Avoid foods with partially hydrogenated oils in them. These contain trans fats. Examples of foods that have trans fats are stick margarine, some tub margarines, cookies, crackers, and other baked goods. What general guidelines do I need to follow?  Check food labels. Look for the words "trans fat" and "saturated fat."  When preparing a meal:  Fill half of your plate with vegetables and green salads.  Fill one fourth of your plate with whole grains. Look for the word "whole" as the first word in the ingredient list.  Fill one fourth of your plate with lean protein foods.  Eat more foods that have fiber, like apples, carrots, beans, peas, and barley.  Eat more home-cooked foods. Eat less at restaurants and buffets.  Limit or avoid alcohol.  Limit foods high in starch and sugar.  Limit fried foods.  Cook foods without frying them. Baking, boiling, grilling, and broiling are all great options.  Lose weight if you are overweight. Losing even a small amount of weight can help your overall health. It can also help prevent diseases such as diabetes and heart disease. What foods can I eat? Grains  Whole grains, such as whole wheat or whole grain breads, crackers, cereals, and pasta. Unsweetened oatmeal,  bulgur, barley, quinoa, or [place]ajda rice. Corn or whole wheat flour tortillas. Vegetables  Fresh or frozen vegetables (raw, steamed, roasted, or grilled). Green salads. Fruits  All fresh, canned (in natural juice), or frozen fruits. Meat and Other Protein Products  Ground beef (85% or leaner), grass-fed beef, or beef trimmed of fat. Skinless chicken or turkey. Ground chicken or turkey. Pork trimmed of fat. All fish and seafood. Eggs. Dried beans, peas, or lentils. Unsalted nuts or seeds. Unsalted canned or dry beans. Dairy  Low-fat dairy products, such as skim or 1% milk, 2% or reduced-fat cheeses, low-fat ricotta or cottage cheese, or plain low-fat yogurt. Fats and Oils  Tub margarines without trans fats. Light or reduced-fat mayonnaise and salad dressings. Avocado. Olive, canola, sesame, or safflower oils. Natural peanut or almond butter (choose ones without added sugar and oil). The items listed above may not be a complete list of recommended foods or beverages. Contact your dietitian for more options.  What foods are not recommended? Grains  White bread. White pasta. White rice. Cornbread. Bagels, pastries, and croissants. Crackers that contain trans fat. Vegetables  White potatoes. Corn. Creamed or fried vegetables. Vegetables in a cheese sauce. Fruits  Dried fruits. Canned fruit in light or heavy syrup. Fruit juice. Meat and Other Protein Products  Fatty cuts of meat. Ribs, chicken wings, bacon, sausage, bologna, salami, chitterlings, fatback, hot dogs, bratwurst, and packaged luncheon meats. Liver and organ meats. Dairy  Whole or 2% milk, cream, half-and-half, and cream cheese. Whole milk cheeses. Whole-fat or sweetened yogurt. Full-fat cheeses. Nondairy creamers and whipped   toppings. Processed cheese, cheese spreads, or cheese curds. Sweets and Desserts  Corn syrup, sugars, honey, and molasses. Candy. Jam and jelly. Syrup. Sweetened cereals. Cookies, pies, cakes, donuts, muffins, and ice  cream. Fats and Oils  Butter, stick margarine, lard, shortening, ghee, or bacon fat. Coconut, palm kernel, or palm oils. Beverages  Alcohol. Sweetened drinks (such as sodas, lemonade, and fruit drinks or punches). The items listed above may not be a complete list of foods and beverages to avoid. Contact your dietitian for more information.  This information is not intended to replace advice given to you by your health care provider. Make sure you discuss any questions you have with your health care provider. Document Released: 10/14/2011 Document Revised: 12/20/2015 Document Reviewed: 07/14/2013 Elsevier Interactive Patient Education  2017 Elsevier Inc[employer]  Consider elevating the head of your bed on 6 inch blocks and take Zantac 150 mg at bedtime.

## 2016-09-08 NOTE — Progress Notes (Signed)
Subjective:     Patient ID: [place]had A Kowaleski, male   DOB: June 10, 1975, 41 y.o.   MRN: [id number]  HPI  Chief Complaint  Patient presents with  . Anxiety    Patient returns to office today for follow up visit, last office visit that 08/05/16. At last office visity we advised tyhat patient continue Buspar 15mg . Patient states that he has good compliance and tolerance on medication, symptom control is improved. Patient states that things have been going well at work and has had no new stressors.   . Chest Pain    Patiewnt reports chest tightness and shortness of breath intermittent, patient can not give an exact date or time frame when symptom occurs. Patient reports that he has noticed swelling in his extremitites from time to time and believes that it may be do to his blood pressure.   States he will wake up at night and feel his chest is tight with shortness of breath Resolves spontaneously by sitting up and taking deep breathes. Weight is increased from last office visit. He has seen orthopedics regarding his chronic back pain and was recommended physical therapy. States it was too expensive to start. Reports being provided diet ideas but has not initiated those. Remains on C-Pap and takes Ambien as needed.   Review of Systems     Objective:   Physical Exam  Constitutional: He appears well-developed and well-nourished. No distress.  Cardiovascular: Regular rhythm.  Tachycardia present.   Pulmonary/Chest: Breath sounds normal.  Musculoskeletal: He exhibits no edema (of lower extremities).       Assessment:    1. Essential (primary) hypertension - hydrochlorothiazide (HYDRODIURIL) 25 MG tablet; Take 1 tablet (25 mg total) by mouth daily.  Dispense: 90 tablet; Refill: 3  2. Acute anxiety: stable on current medication  3. Mixed hyperlipidemia  4. Chronic bilateral low back pain without sciatica: per orthopedics  5. Chest discomfort: ? Reflux equivalent    Plan:    Discussed H.O.B.  elevation and Zantac at bedtime. Dietary information provided. Patient aware that weight is driving most of his medical problems.

## 2016-09-29 ENCOUNTER — Telehealth: Payer: Self-pay | Admitting: Family Medicine

## 2016-09-29 ENCOUNTER — Ambulatory Visit (INDEPENDENT_AMBULATORY_CARE_PROVIDER_SITE_OTHER): Payer: BC Managed Care – PPO | Admitting: Family Medicine

## 2016-09-29 ENCOUNTER — Encounter: Payer: Self-pay | Admitting: Family Medicine

## 2016-09-29 ENCOUNTER — Other Ambulatory Visit: Payer: Self-pay | Admitting: Family Medicine

## 2016-09-29 VITALS — BP 138/100 | HR 115 | Temp 98.7°F | Resp 16 | Wt 292.0 lb

## 2016-09-29 DIAGNOSIS — I10 Essential (primary) hypertension: Secondary | ICD-10-CM

## 2016-09-29 DIAGNOSIS — R0789 Other chest pain: Secondary | ICD-10-CM | POA: Diagnosis not present

## 2016-09-29 DIAGNOSIS — R739 Hyperglycemia, unspecified: Secondary | ICD-10-CM | POA: Diagnosis not present

## 2016-09-29 LAB — POCT GLYCOSYLATED HEMOGLOBIN (HGB A1C): Hemoglobin A1C: 6.8

## 2016-09-29 MED ORDER — LOSARTAN POTASSIUM 50 MG PO TABS: 50.0000 mg | ORAL_TABLET | Freq: Every day | ORAL | 0 refills | Status: DC

## 2016-09-29 MED ORDER — AMLODIPINE BESYLATE 5 MG PO TABS: 5.0000 mg | ORAL_TABLET | Freq: Every day | ORAL | 0 refills | Status: DC

## 2016-09-29 NOTE — Progress Notes (Signed)
Subjective:     Patient ID: [name] Walker, male   DOB: 12-19-1975, 41 y.o.   MRN: [id number]  HPI  Chief Complaint  Patient presents with  . Hypertension    Patient returns to office today fo follow up, last office visit was 09/08/16 blood pressure at visit was 144/112 and was started on HCTZ 25mg .  Patient reports good compliance on medication and states that he has noticed frequent urination.   . Chest Pain    Patient returns to office to follow up for chest discomfort, last visit was 09/08/16 and was advised to take otc Zantac qhs. Patient reports that he never started medication and that shcest discomfort improved.   . Hyperglycemia    Patient returns for follow up visit last visit 05/19/16 and HgbA1C was 6.4%. Patient reports that he has been working on improving his diet and adding more vegetables.   States he continues to drink Dr. Reino KentPepper and has not stated an exercise program. Weight loss is 19 # since 5/14 due to diuretic and dietary improvements.   Review of Systems     Objective:   Physical Exam  Constitutional: He appears well-developed and well-nourished. No distress.  Cardiovascular: Regular rhythm.   tachycardia  Pulmonary/Chest: Breath sounds normal.  Musculoskeletal: He exhibits no edema (of lower extremities).       Assessment:    1. Blood glucose elevated: continue lifestyle changes - POCT glycosylated hemoglobin (Hb A1C)  2. Essential (primary) hypertension: continue HCTZ - losartan (COZAAR) 50 MG tablet; Take 1 tablet (50 mg total) by mouth daily.  Dispense: 90 tablet; Refill: 0  3. Chest discomfort: resolved.    Plan:    Further f/u in 4 weeks. Encourage walking program.

## 2016-09-29 NOTE — Telephone Encounter (Signed)
LMTCB

## 2016-09-29 NOTE — Patient Instructions (Signed)
Continue with new dietary choices and quit sweet drinks. Encourage walking 30 minutes daily.

## 2016-09-29 NOTE — Telephone Encounter (Signed)
Please advise. Thanks.  

## 2016-09-29 NOTE — Telephone Encounter (Signed)
Pt stated he had an OV this morning with Nadine CountsBob and he went to pick up his losartan (COZAAR) 50 MG tablet. Pt stated that he didn't get the medication b/c it was going to be over 80 $ out of pocket and that was with his insurance. Pt stated that the person at [facility] didn't give him any information as to why the cost was so high or what his insurance would cover. Pt is requesting that something cheaper be sent to [facility] [address] and someone call him to let him no what is sent in. Please advise. Thanks TNP

## 2016-09-29 NOTE — Telephone Encounter (Signed)
Pt informed

## 2016-09-29 NOTE — Telephone Encounter (Signed)
I have sent in amlodipine #30.

## 2016-10-27 ENCOUNTER — Ambulatory Visit: Payer: BC Managed Care – PPO | Admitting: Family Medicine

## 2016-11-04 ENCOUNTER — Telehealth: Payer: Self-pay | Admitting: Family Medicine

## 2016-11-04 ENCOUNTER — Other Ambulatory Visit: Payer: Self-pay | Admitting: Family Medicine

## 2016-11-04 DIAGNOSIS — I10 Essential (primary) hypertension: Secondary | ICD-10-CM

## 2016-11-04 MED ORDER — AMLODIPINE BESYLATE 5 MG PO TABS
5.0000 mg | ORAL_TABLET | Freq: Every day | ORAL | 5 refills | Status: DC
Start: 2016-11-04 — End: 2017-05-26

## 2016-11-04 NOTE — Telephone Encounter (Signed)
Amlodipine refilled.

## 2016-11-04 NOTE — Telephone Encounter (Signed)
Please review. Thanks!  

## 2016-11-04 NOTE — Telephone Encounter (Signed)
Pt needs refill on his   amLODipine (NORVASC) 5 MG tablet   09/29/16 -- Anola Gurneyhauvin, Robert, PA    Take 1 tablet (5 mg total) by mouth daily    [facility] [address]  [address]

## 2016-11-10 ENCOUNTER — Ambulatory Visit: Payer: BC Managed Care – PPO | Admitting: Family Medicine

## 2017-05-26 ENCOUNTER — Other Ambulatory Visit: Payer: Self-pay | Admitting: Family Medicine

## 2017-05-26 DIAGNOSIS — I10 Essential (primary) hypertension: Secondary | ICD-10-CM

## 2017-05-29 ENCOUNTER — Other Ambulatory Visit: Payer: Self-pay | Admitting: Family Medicine

## 2017-05-29 DIAGNOSIS — I10 Essential (primary) hypertension: Secondary | ICD-10-CM

## 2017-07-07 ENCOUNTER — Ambulatory Visit: Payer: BC Managed Care – PPO | Admitting: Family Medicine

## 2017-07-07 ENCOUNTER — Encounter: Payer: Self-pay | Admitting: Family Medicine

## 2017-07-07 VITALS — BP 134/100 | HR 104 | Temp 98.8°F | Resp 16 | Wt 312.2 lb

## 2017-07-07 DIAGNOSIS — M545 Low back pain, unspecified: Secondary | ICD-10-CM

## 2017-07-07 DIAGNOSIS — E782 Mixed hyperlipidemia: Secondary | ICD-10-CM

## 2017-07-07 DIAGNOSIS — E119 Type 2 diabetes mellitus without complications: Secondary | ICD-10-CM

## 2017-07-07 DIAGNOSIS — G8929 Other chronic pain: Secondary | ICD-10-CM

## 2017-07-07 DIAGNOSIS — M7062 Trochanteric bursitis, left hip: Secondary | ICD-10-CM

## 2017-07-07 DIAGNOSIS — I10 Essential (primary) hypertension: Secondary | ICD-10-CM

## 2017-07-07 LAB — POCT GLYCOSYLATED HEMOGLOBIN (HGB A1C): Hemoglobin A1C: 7.7

## 2017-07-07 MED ORDER — MELOXICAM 15 MG PO TABS: 15.0000 mg | ORAL_TABLET | Freq: Every day | ORAL | 0 refills | Status: DC

## 2017-07-07 MED ORDER — METFORMIN HCL 500 MG PO TABS: 500.0000 mg | ORAL_TABLET | Freq: Two times a day (BID) | ORAL | 1 refills | Status: DC

## 2017-07-07 MED ORDER — AMLODIPINE BESYLATE 5 MG PO TABS: 5.0000 mg | ORAL_TABLET | Freq: Every day | ORAL | 0 refills | Status: DC

## 2017-07-07 NOTE — Patient Instructions (Signed)
We will call you with the lab and x-ray results. [facility] will also call you about diabetes classes. Please start a daily exercise program-walking up to 30 minutes.

## 2017-07-07 NOTE — Progress Notes (Addendum)
Subjective:     Patient ID: [name] Walker, male   DOB: 1976-04-04, 42 y.o.   MRN: [id number]  Chief Complaint  Patient presents with  . Hyperglycemia    Patient returns for follow up visit, patient was last seen 09/29/16 HgbA1C was 6.8%. Patient states that he was working on improving diet and is still working on maintaining a Miranthelathy diet. Patient denies increased thirst but states that he has increased urination in the PM  . Hypertension    Patient returns for follow up from 09/29/16, blood pressure at visit was 138/100 patient was advised to continue HCTZ and start Amlodipine. Patient reports that he had good compliance on medication and tolerance, he has been out of Amlodipine for one week.  . Back Pain    Patient reports lower back apain radiating down left thigh for the past 3 months, patient reports bilateral hip pain for the past 2 months. Patient states that he has been taking Ibuprofen for pain.  . Insomnia    Patient reports difficulty sleeping at night, he states that he uses a CPap machine but averages 4hrs a night.   HPI Lost to f/u for hypertension and hyperglycemia states he has been out of amlodipine for a week. He was initially placed on losartan but was too expensive. 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 No regular exercise though does not believe he overeats. Married with a one and 40 year old.   Review of Systems  Respiratory:       Reports a few episodes of non-exertional chest pressure especially when lying down.  Musculoskeletal:       History of L1 compression fracture in 10/17       Objective:   Physical Exam  Constitutional: He appears well-developed and well-nourished. No distress.  Somnolent-falling asleep while sitting/standing  Cardiovascular: Normal rate and regular rhythm.  Pulmonary/Chest: Breath sounds normal.  Musculoskeletal: He exhibits no edema (of lower extremities).  Localizes back pain across his upper lumbar area. Muscle strength in lower  extremities 5/5. SLR's to 90 degrees without radiation of back pain. Tender over his left trochanteric bursa       Assessment:    1. Essential (primary) hypertension: refill amlodipine - Comprehensive metabolic panel  2. Mixed hyperlipidemia - Lipid panel  3. Chronic bilateral low back pain without sciatica - meloxicam (MOBIC) 15 MG tablet; Take 1 tablet (15 mg total) by mouth daily.  Dispense: 30 tablet; Refill: 0 - DG Lumbar Spine Complete; Future  4. Diabetes mellitus without complication (HCC): started metformin - Ambulatory referral to diabetic education - POCT glycosylated hemoglobin (Hb A1C)  5. Trochanteric bursitis of left hip: consider orthopedic referral     Plan:    Encouraged exercise. Further f/u pending lab and x-ray results. [facility].  Told patient that he was at high risk for a cardiovascular event.

## 2017-07-08 ENCOUNTER — Telehealth: Payer: Self-pay

## 2017-07-08 LAB — COMPREHENSIVE METABOLIC PANEL
ALK PHOS: 75 IU/L (ref 39–117)
ALT: 44 IU/L (ref 0–44)
AST: 39 IU/L (ref 0–40)
Albumin/Globulin Ratio: 1.1 — ABNORMAL LOW (ref 1.2–2.2)
Albumin: 3.8 g/dL (ref 3.5–5.5)
BILIRUBIN TOTAL: 0.2 mg/dL (ref 0.0–1.2)
BUN / CREAT RATIO: 9 (ref 9–20)
BUN: 7 mg/dL (ref 6–24)
CHLORIDE: 100 mmol/L (ref 96–106)
CO2: 25 mmol/L (ref 20–29)
Calcium: 9.1 mg/dL (ref 8.7–10.2)
Creatinine, Ser: 0.82 mg/dL (ref 0.76–1.27)
GFR calc Af Amer: 128 mL/min/{1.73_m2} (ref >59)
GFR calc non Af Amer: 111 mL/min/{1.73_m2} (ref >59)
GLUCOSE: 129 mg/dL — AB (ref 65–99)
Globulin, Total: 3.5 g/dL (ref 1.5–4.5)
Potassium: 3.6 mmol/L (ref 3.5–5.2)
Sodium: 142 mmol/L (ref 134–144)
Total Protein: 7.3 g/dL (ref 6.0–8.5)

## 2017-07-08 LAB — LIPID PANEL
Chol/HDL Ratio: 5.2 ratio — ABNORMAL HIGH (ref 0.0–5.0)
Cholesterol, Total: 222 mg/dL — ABNORMAL HIGH (ref 100–199)
HDL: 43 mg/dL (ref >39)
LDL Calculated: 164 mg/dL — ABNORMAL HIGH (ref 0–99)
Triglycerides: 76 mg/dL (ref 0–149)
VLDL CHOLESTEROL CAL: 15 mg/dL (ref 5–40)

## 2017-07-08 NOTE — Telephone Encounter (Signed)
-----   Message from Anola Gurneyobert Chauvin, [place] sent at 07/08/2017  7:39 AM EDT ----- Sugar is mildly elevated @ 129 (> 125 is diagnostic of diabetes). Cholesterol remains elevated and I would recommend a cholesterol lowering drug. The remainder of your labs look good. Do you wish to proceed with the cholesterol lowering medication?

## 2017-07-08 NOTE — Telephone Encounter (Signed)
[id number] 

## 2017-07-09 ENCOUNTER — Other Ambulatory Visit: Payer: Self-pay | Admitting: Family Medicine

## 2017-07-09 MED ORDER — ATORVASTATIN CALCIUM 80 MG PO TABS: 80.0000 mg | ORAL_TABLET | Freq: Every day | ORAL | 1 refills | Status: DC

## 2017-07-09 NOTE — Telephone Encounter (Signed)
Patient advised he states that he would like to try medication, please send to [facility] [address] KW

## 2017-07-09 NOTE — Telephone Encounter (Signed)
I have sent in atorvastatin. Will see how he is doing on it at next office visit.

## 2017-07-16 ENCOUNTER — Telehealth: Payer: Self-pay | Admitting: Family Medicine

## 2017-07-16 ENCOUNTER — Other Ambulatory Visit: Payer: Self-pay | Admitting: Family Medicine

## 2017-07-16 MED ORDER — ATORVASTATIN CALCIUM 40 MG PO TABS: ORAL_TABLET | ORAL | 2 refills | Status: DC

## 2017-07-16 NOTE — Telephone Encounter (Signed)
[facility] wanted to know if you could send in RX for Atorvastatin 40 mg, to take 2 pills daily #60 w/ refills.   Pt ins. Is not wanting to cover the 80 mg. Pill.

## 2017-07-16 NOTE — Telephone Encounter (Signed)
done

## 2017-08-03 ENCOUNTER — Encounter: Payer: Self-pay | Admitting: Family Medicine

## 2017-08-03 ENCOUNTER — Ambulatory Visit (INDEPENDENT_AMBULATORY_CARE_PROVIDER_SITE_OTHER): Payer: BC Managed Care – PPO | Admitting: Family Medicine

## 2017-08-03 VITALS — BP 172/108 | HR 114 | Temp 98.4°F | Resp 17 | Wt 316.8 lb

## 2017-08-03 DIAGNOSIS — M7062 Trochanteric bursitis, left hip: Secondary | ICD-10-CM | POA: Diagnosis not present

## 2017-08-03 DIAGNOSIS — G8929 Other chronic pain: Secondary | ICD-10-CM

## 2017-08-03 DIAGNOSIS — G4733 Obstructive sleep apnea (adult) (pediatric): Secondary | ICD-10-CM

## 2017-08-03 DIAGNOSIS — I10 Essential (primary) hypertension: Secondary | ICD-10-CM | POA: Diagnosis not present

## 2017-08-03 DIAGNOSIS — M545 Low back pain: Secondary | ICD-10-CM

## 2017-08-03 DIAGNOSIS — E119 Type 2 diabetes mellitus without complications: Secondary | ICD-10-CM | POA: Diagnosis not present

## 2017-08-03 MED ORDER — MELOXICAM 15 MG PO TABS: 15.0000 mg | ORAL_TABLET | Freq: Every day | ORAL | 0 refills | Status: DC

## 2017-08-03 MED ORDER — METOPROLOL SUCCINATE ER 50 MG PO TB24: 50.0000 mg | ORAL_TABLET | Freq: Every day | ORAL | 2 refills | Status: DC

## 2017-08-03 MED ORDER — GLIPIZIDE 5 MG PO TABS: ORAL_TABLET | ORAL | 2 refills | Status: DC

## 2017-08-03 NOTE — Patient Instructions (Addendum)
May take Tylenol extra strength 3 x day for back and leg pain along with meloxicam. Stop metformin and amlodipine. Try to use your CPAP every night.

## 2017-08-03 NOTE — Progress Notes (Signed)
Subjective:     Patient ID: [name] Walker, male   DOB: [birth year], 42 y.o.   MRN: [id number] Chief Complaint  Patient presents with  . Hypertension    Patient comes in office today for follow up from 07/07/17 blood pressure in house was 134/100, patient reports good compliance and tolerance on Amlodipine.   . Back Pain    Patient returns for follow up of chronic back pain from 07/07/17, patient was started on Meloxicam and states that pain went away slightly but than returned and describes pain as a burning sensation in his left thigh.   . Diabetes    Patient returns for follow up from 07/07/17, at last visit HgbA1C was 7.7% patient was started on Metformin. Patient reports good compliance on medication but states that it causes him G.I upset.   . Insomnia    Patient reports that he is still having difficulty sleeping.    HPI Reports increased ankle swelling and diarrhea/stomach discomfort. He is staying at his mother- in- law's home as they are selling their current home and has trouble sleeping in the new location. CPAP use is not consistent. He turned down [facility] referral for diabetes due to time constraints. Continues to have his prior orthopedic concerns but did not get x-ray or wish referral to orthopedics at this time. He continues to fall asleep in the office while sitting and states he did not sleep well last night.  Review of Systems  Respiratory: Negative for shortness of breath.   Cardiovascular: Negative for chest pain and palpitations.       Objective:   Physical Exam  Constitutional: He appears well-developed and well-nourished. No distress.  Cardiovascular:  tachycardic  Pulmonary/Chest: Breath sounds normal.  Musculoskeletal: He exhibits edema (of lower extremities).       Assessment:    1. Essential (primary) hypertension: continue HCTZ, stop amlodipine, start metoprolol.  2. Diabetes mellitus without complication (HCC): stop metformin - glipiZIDE (GLUCOTROL) 5 MG  tablet; Take one pill twice daily 30 minutes before the biggest meal of the day  Dispense: 60 tablet; Refill: 2  3. OSA (obstructive sleep apnea)  4. Chronic bilateral low back pain without sciatica - meloxicam (MOBIC) 15 MG tablet; Take 1 tablet (15 mg total) by mouth daily.  Dispense: 30 tablet; Refill: 0  5. Trochanteric bursitis of left hip - meloxicam (MOBIC) 15 MG tablet; Take 1 tablet (15 mg total) by mouth daily.  Dispense: 30 tablet; Refill: 0    Plan:    Discussed scheduling E.S. Tylenol. Encouraged use of CPAP consistently. Consider auto-titration study.

## 2017-08-24 ENCOUNTER — Other Ambulatory Visit: Payer: Self-pay | Admitting: Family Medicine

## 2017-08-24 DIAGNOSIS — I10 Essential (primary) hypertension: Secondary | ICD-10-CM

## 2017-08-31 ENCOUNTER — Telehealth: Payer: Self-pay | Admitting: Family Medicine

## 2017-08-31 ENCOUNTER — Other Ambulatory Visit: Payer: Self-pay | Admitting: Family Medicine

## 2017-08-31 ENCOUNTER — Ambulatory Visit: Payer: Self-pay | Admitting: Family Medicine

## 2017-08-31 DIAGNOSIS — G4733 Obstructive sleep apnea (adult) (pediatric): Secondary | ICD-10-CM

## 2017-08-31 NOTE — Telephone Encounter (Signed)
I called [name] Walker and encouraged him to reschedule this week. We will also try to get him a different CPAP mask that he can tolerate. Will discuss ETOH at the o.v.

## 2017-09-13 ENCOUNTER — Other Ambulatory Visit: Payer: Self-pay

## 2017-09-13 ENCOUNTER — Emergency Department
Admission: EM | Admit: 2017-09-13 | Discharge: 2017-09-13 | Disposition: A | Discharge status: Home / Self Care | Payer: BC Managed Care – PPO | Attending: Emergency Medicine | Admitting: Emergency Medicine

## 2017-09-13 ENCOUNTER — Emergency Department: Payer: BC Managed Care – PPO

## 2017-09-13 DIAGNOSIS — Z7984 Long term (current) use of oral hypoglycemic drugs: Secondary | ICD-10-CM | POA: Insufficient documentation

## 2017-09-13 DIAGNOSIS — F17210 Nicotine dependence, cigarettes, uncomplicated: Secondary | ICD-10-CM | POA: Diagnosis not present

## 2017-09-13 DIAGNOSIS — Z79899 Other long term (current) drug therapy: Secondary | ICD-10-CM | POA: Diagnosis not present

## 2017-09-13 DIAGNOSIS — I10 Essential (primary) hypertension: Secondary | ICD-10-CM | POA: Insufficient documentation

## 2017-09-13 DIAGNOSIS — M25421 Effusion, right elbow: Secondary | ICD-10-CM

## 2017-09-13 DIAGNOSIS — E119 Type 2 diabetes mellitus without complications: Secondary | ICD-10-CM | POA: Diagnosis not present

## 2017-09-13 DIAGNOSIS — M25521 Pain in right elbow: Secondary | ICD-10-CM | POA: Diagnosis present

## 2017-09-13 DIAGNOSIS — W19XXXA Unspecified fall, initial encounter: Secondary | ICD-10-CM

## 2017-09-13 MED ORDER — NAPROXEN 500 MG PO TABS
500.0000 mg | ORAL_TABLET | Freq: Once | ORAL | Status: AC
  Administered 2017-09-13: 500 mg via ORAL
  Filled 2017-09-13: qty 1

## 2017-09-13 MED ORDER — NAPROXEN 500 MG PO TABS: 500.0000 mg | ORAL_TABLET | Freq: Two times a day (BID) | ORAL | 0 refills | Status: DC

## 2017-09-13 MED ORDER — HYDROCODONE-ACETAMINOPHEN 5-325 MG PO TABS: 1.0000 | ORAL_TABLET | Freq: Four times a day (QID) | ORAL | 0 refills | Status: DC | PRN

## 2017-09-13 NOTE — ED Provider Notes (Signed)
[facility] Emergency Department Provider Note ____________________________________________  Time seen: Approximately 9:32 PM  I have reviewed the triage vital signs and the nursing notes.   HISTORY  Chief Complaint Arm Pain    HPI [name] Walker is a 42 y.o. male who presents to the emergency department for evaluation and treatment of right elbow pain.  Patient states that he fell onto outstretched arms last night.  Since that time, he has been unable to completely extend the right arm due to significant pain in the right elbow.  He states that the elbow feels "tight."  No alleviating measures have been attempted for this complaint  Past Medical History:  Diagnosis Date  . Hypertension     Patient Active Problem List   Diagnosis Date Noted  . Chronic bilateral low back pain without sciatica 08/05/2016  . Acute anxiety 08/05/2016  . Essential (primary) hypertension 10/31/2015  . Diabetes mellitus without complication (HCC) 10/31/2015  . HLD (hyperlipidemia) 10/31/2015  . OSA (obstructive sleep apnea) 10/31/2015  . Exomphalos 10/31/2015    Past Surgical History:  Procedure Laterality Date  . TONSILLECTOMY  1985    Prior to Admission medications   Medication Sig Start Date End Date Taking? Authorizing Provider  atorvastatin (LIPITOR) 40 MG tablet Two pills daily 07/16/17   Anola Gurney, PA  busPIRone (BUSPAR) 15 MG tablet One pill twice daily 08/05/16   Anola Gurney, PA  glipiZIDE (GLUCOTROL) 5 MG tablet Take one pill twice daily 30 minutes before the biggest meal of the day 08/03/17   Anola Gurney, PA  hydrochlorothiazide (HYDRODIURIL) 25 MG tablet TAKE 1 TABLET BY MOUTH ONCE DAILY 08/24/17   Anola Gurney, PA  HYDROcodone-acetaminophen (NORCO/VICODIN) 5-325 MG tablet Take 1 tablet by mouth every 6 (six) hours as needed for moderate pain. 09/13/17 09/13/18  Shanley Furlough, Rulon Eisenmenger B, FNP  ibuprofen (ADVIL,MOTRIN) 600 MG tablet Take 600 mg by mouth every 6  (six) hours as needed.    [provider]  meloxicam (MOBIC) 15 MG tablet Take 1 tablet (15 mg total) by mouth daily. 08/03/17   Anola Gurney, PA  metoprolol succinate (TOPROL-XL) 50 MG 24 hr tablet Take 1 tablet (50 mg total) by mouth daily. Take with or immediately following a meal. 08/03/17   Anola Gurney, PA  naproxen (NAPROSYN) 500 MG tablet Take 1 tablet (500 mg total) by mouth 2 (two) times daily with a meal. 09/13/17   Kadian Barcellos, Kasandra Knudsen, FNP  NON FORMULARY CPAP everynight    [provider]  zolpidem (AMBIEN) 10 MG tablet  07/30/16   [provider]    Allergies Ace inhibitors; Amlodipine; Metformin and related; and Other  Family History  Problem Relation Age of Onset  . Diabetes Mother   . Cancer Mother        lung    Social History Social History   Tobacco Use  . Smoking status: Current Some Day Smoker    Types: Cigarettes  . Smokeless tobacco: Never Used  Substance Use Topics  . Alcohol use: Yes  . Drug use: Not on file    Review of Systems Constitutional: Negative for fever. Cardiovascular: Negative for chest pain. Respiratory: Negative for shortness of breath. Musculoskeletal: Positive for right elbow pain Skin: Negative for open wound or lesion Neurological: Negative for decrease in sensation  ____________________________________________   PHYSICAL EXAM:  VITAL SIGNS: ED Triage Vitals  Enc Vitals Group     BP 09/13/17 2113 (!) 157/99     Pulse Rate 09/13/17  2113 (!) 117     Resp 09/13/17 2113 18     Temp 09/13/17 2113 98.9 F (37.2 C)     Temp Source 09/13/17 2113 Oral     SpO2 --      Weight 09/13/17 2114 290 lb (131.5 kg)     Height 09/13/17 2114  (1.727 m)     Head Circumference --      Peak Flow --      Pain Score 09/13/17 2114 8     Pain Loc --      Pain Edu? --      Excl. in GC? --     Constitutional: Alert and oriented. Well appearing and in no acute distress. Eyes: Conjunctivae are clear without  discharge or drainage Head: Atraumatic Neck: Supple Respiratory: No cough. Respirations are even and unlabored. Musculoskeletal: Focal tenderness with palpation over the proximal aspect of the radius.  No tenderness over the olecranon.  No pain with range of motion of the right shoulder, wrist, or fingers.  No obvious deformity about the right elbow, however there is some swelling. Neurologic: Motor and sensation is intact of the right hand and fingers. Skin: Intact Psychiatric: Affect and behavior are appropriate.  ____________________________________________   LABS (all labs ordered are listed, but only abnormal results are displayed)  Labs Reviewed - No data to display ____________________________________________  RADIOLOGY  Right elbow image shows a joint effusion/fat pad per radiology. ____________________________________________   PROCEDURES  .Splint Application Date/Time: 09/14/2017 12:13 AM Performed by: Hughes Better, NT Authorized by: Chinita Pester, FNP   Consent:    Consent obtained:  Verbal   Consent given by:  Patient   Risks discussed:  Pain Pre-procedure details:    Sensation:  Normal Procedure details:    Laterality:  Right   Location:  Elbow   Splint type:  Long arm   Supplies:  Elastic bandage, cotton padding and Ortho-Glass Post-procedure details:    Pain:  Improved   Sensation:  Normal   Patient tolerance of procedure:  Tolerated well, no immediate complications    ____________________________________________   INITIAL IMPRESSION / ASSESSMENT AND PLAN / ED COURSE  [name] A Nack is a 43 y.o. who presents to the emergency department for treatment and evaluation of right elbow pain after falling on outstretched hand last night.  Symptoms and exam are consistent with fracture, however due to the joint effusion it is unclear at this time.   Patient instructed to follow-up with orthopedics as soon as possible.  He will call tomorrow to  schedule his appointment.Marland Kitchen  He was also instructed to return to the emergency department for symptoms that change or worsen if unable schedule an appointment with orthopedics or primary care.  Medications  naproxen (NAPROSYN) tablet 500 mg (500 mg Oral Given 09/13/17 2240)    Pertinent labs & imaging results that were available during my care of the patient were reviewed by me and considered in my medical decision making (see chart for details).  _________________________________________   FINAL CLINICAL IMPRESSION(S) / ED DIAGNOSES  Final diagnoses:  Elbow joint effusion, right    ED Discharge Orders        Ordered    HYDROcodone-acetaminophen (NORCO/VICODIN) 5-325 MG tablet  Every 6 hours PRN     09/13/17 2223    naproxen (NAPROSYN) 500 MG tablet  2 times daily with meals     09/13/17 2223       If controlled substance prescribed during this visit,  12 month history viewed on the [facility] prior to issuing an initial prescription for Schedule II or III opiod.    Chinita Pester, FNP 09/14/17 0015    Phineas Semen, MD 09/15/17 [phone]

## 2017-09-13 NOTE — ED Triage Notes (Signed)
Patient presents with right arm pain after a fall in the night last night. States he has a bad back and was bending over trying to "make it feel better" then states he "fell asleep" and fell onto outstretched arms. Thinks he may have landed on it or "broke it" but just can't move it at all. Is a Paediatric nurse and "needs something done."

## 2017-09-15 ENCOUNTER — Other Ambulatory Visit: Payer: Self-pay | Admitting: Family Medicine

## 2017-09-15 DIAGNOSIS — F419 Anxiety disorder, unspecified: Secondary | ICD-10-CM

## 2017-10-24 ENCOUNTER — Encounter: Payer: Self-pay | Admitting: Emergency Medicine

## 2017-10-24 ENCOUNTER — Emergency Department
Admission: EM | Admit: 2017-10-24 | Discharge: 2017-10-24 | Disposition: A | Discharge status: Home / Self Care | Payer: BC Managed Care – PPO | Attending: Emergency Medicine | Admitting: Emergency Medicine

## 2017-10-24 ENCOUNTER — Other Ambulatory Visit: Payer: Self-pay

## 2017-10-24 ENCOUNTER — Emergency Department: Payer: BC Managed Care – PPO

## 2017-10-24 DIAGNOSIS — Y9389 Activity, other specified: Secondary | ICD-10-CM | POA: Insufficient documentation

## 2017-10-24 DIAGNOSIS — I10 Essential (primary) hypertension: Secondary | ICD-10-CM | POA: Diagnosis not present

## 2017-10-24 DIAGNOSIS — F17210 Nicotine dependence, cigarettes, uncomplicated: Secondary | ICD-10-CM | POA: Diagnosis not present

## 2017-10-24 DIAGNOSIS — Z7984 Long term (current) use of oral hypoglycemic drugs: Secondary | ICD-10-CM | POA: Diagnosis not present

## 2017-10-24 DIAGNOSIS — Z79899 Other long term (current) drug therapy: Secondary | ICD-10-CM | POA: Diagnosis not present

## 2017-10-24 DIAGNOSIS — M25562 Pain in left knee: Secondary | ICD-10-CM | POA: Insufficient documentation

## 2017-10-24 DIAGNOSIS — E119 Type 2 diabetes mellitus without complications: Secondary | ICD-10-CM | POA: Insufficient documentation

## 2017-10-24 DIAGNOSIS — Y92012 Bathroom of single-family (private) house as the place of occurrence of the external cause: Secondary | ICD-10-CM | POA: Diagnosis not present

## 2017-10-24 DIAGNOSIS — W19XXXA Unspecified fall, initial encounter: Secondary | ICD-10-CM | POA: Insufficient documentation

## 2017-10-24 DIAGNOSIS — Y998 Other external cause status: Secondary | ICD-10-CM | POA: Diagnosis not present

## 2017-10-24 MED ORDER — HYDROCODONE-ACETAMINOPHEN 5-325 MG PO TABS
1.0000 | ORAL_TABLET | Freq: Once | ORAL | Status: AC
  Administered 2017-10-24: 1 via ORAL
  Filled 2017-10-24: qty 1

## 2017-10-24 MED ORDER — PREDNISONE 10 MG PO TABS: ORAL_TABLET | ORAL | 0 refills | Status: DC

## 2017-10-24 NOTE — Discharge Instructions (Addendum)
Xray is negative for acute fracture. We are treating you for a possible bursitis with Prednisone. Avoid Advil, Ibuprofen, Motrin, Aleve while taking Prednisone. Can take Tylenol 1000 mg every 8 hours as needed. Ok to continue knee brace. Follow up with your PCP in 1 week if pain persist or worsens.

## 2017-10-24 NOTE — ED Triage Notes (Signed)
Pt to ED reporting left knee pain x 2 days. Pt took an "awkward fall" and after the fall the knee felt "funny" and wanted to have it evaluated.

## 2017-10-24 NOTE — ED Provider Notes (Signed)
[facility] Emergency Department Provider Note ____________________________________________  Time seen: 1350  I have reviewed the triage vital signs and the nursing notes.  HISTORY  Chief Complaint  Knee Pain   HPI [name] Walker is a 42 y.o. male presents to the clinic today with c/o left knee pain. He reports this started last night after he fell in his bathroom. He reports he actually fell backwards, and landed on his buttocks. He denies back pain but reports his left knee has been hurting since that time. He describes the pain as stabbing and throbbing. He denies swelling, bruising, numbness or tingling. The pain is worse with weight bearing. He has not tried squatting or walking up or down stairs sine the fall. He denies any prior knee injury. He has tried Tylenol and a brace with minimal relief.  Past Medical History:  Diagnosis Date  . Hypertension     Patient Active Problem List   Diagnosis Date Noted  . Chronic bilateral low back pain without sciatica 08/05/2016  . Acute anxiety 08/05/2016  . Essential (primary) hypertension 10/31/2015  . Diabetes mellitus without complication (HCC) 10/31/2015  . HLD (hyperlipidemia) 10/31/2015  . OSA (obstructive sleep apnea) 10/31/2015  . Exomphalos 10/31/2015    Past Surgical History:  Procedure Laterality Date  . TONSILLECTOMY  1985    Prior to Admission medications   Medication Sig Start Date End Date Taking? Authorizing Provider  atorvastatin (LIPITOR) 40 MG tablet Two pills daily 07/16/17   Anola Gurney, PA  busPIRone (BUSPAR) 15 MG tablet TAKE 1 TABLET BY MOUTH TWICE DAILY 09/15/17   Anola Gurney, PA  glipiZIDE (GLUCOTROL) 5 MG tablet Take one pill twice daily 30 minutes before the biggest meal of the day 08/03/17   Anola Gurney, PA  hydrochlorothiazide (HYDRODIURIL) 25 MG tablet TAKE 1 TABLET BY MOUTH ONCE DAILY 08/24/17   Anola Gurney, PA  HYDROcodone-acetaminophen (NORCO/VICODIN) 5-325 MG  tablet Take 1 tablet by mouth every 6 (six) hours as needed for moderate pain. 09/13/17 09/13/18  Triplett, Rulon Eisenmenger B, FNP  ibuprofen (ADVIL,MOTRIN) 600 MG tablet Take 600 mg by mouth every 6 (six) hours as needed.    [provider]  meloxicam (MOBIC) 15 MG tablet Take 1 tablet (15 mg total) by mouth daily. 08/03/17   Anola Gurney, PA  metoprolol succinate (TOPROL-XL) 50 MG 24 hr tablet Take 1 tablet (50 mg total) by mouth daily. Take with or immediately following a meal. 08/03/17   Anola Gurney, PA  naproxen (NAPROSYN) 500 MG tablet Take 1 tablet (500 mg total) by mouth 2 (two) times daily with a meal. 09/13/17   Triplett, Cari B, FNP  NON FORMULARY CPAP everynight    [provider]  predniSONE (DELTASONE) 10 MG tablet Take 3 tabs on days 1-2, take 2 tabs on days 3-4, take 1 tab on days 5-6 10/24/17   Lorre Munroe, NP  zolpidem (AMBIEN) 10 MG tablet  07/30/16   [provider]    Allergies Ace inhibitors; Amlodipine; Metformin and related; and Other  Family History  Problem Relation Age of Onset  . Diabetes Mother   . Cancer Mother        lung    Social History Social History   Tobacco Use  . Smoking status: Current Some Day Smoker    Types: Cigarettes  . Smokeless tobacco: Never Used  Substance Use Topics  . Alcohol use: Yes  . Drug use: Not on file    Review of Systems  Constitutional: Negative for fever. Musculoskeletal: Positive for left knee pain. Negative for back pain. Skin: Negative for bruising. Neurological: Negative for focal weakness or numbness. ____________________________________________  PHYSICAL EXAM:  VITAL SIGNS: ED Triage Vitals  Enc Vitals Group     BP 10/24/17 1342 (!) 158/98     Pulse Rate 10/24/17 1342 (!) 101     Resp 10/24/17 1342 18     Temp 10/24/17 1342 98.2 F (36.8 C)     Temp Source 10/24/17 1342 Oral     SpO2 10/24/17 1342 94 %     Weight 10/24/17 1344 280 lb (127 kg)     Height 10/24/17 1344 5\' 8"  (1.727  m)     Head Circumference --      Peak Flow --      Pain Score 10/24/17 1344 7     Pain Loc --      Pain Edu? --      Excl. in GC? --     Constitutional: Alert and oriented. Obese, in no distress. Musculoskeletal: Normal extension, pain with flexion of the left knee. No joint swelling noted. Pain with palpation over the medial pes bursa and medial joint line. Gait slow but steady. He is not able to walk on tiptoes, but can walk on heels. Neurologic:  Sensation intact to BLE. Skin:  Skin is warm, dry and intact. No bruising noted.  ____________________________________________   RADIOLOGY   Imaging Orders     DG Knee Complete 4 Views Left   IMPRESSION:  1. Moderate suprapatellar effusion. No fractures. Minimal  degenerative changes.   ____________________________________________    INITIAL IMPRESSION / ASSESSMENT AND PLAN / ED COURSE  Left Knee Pain s/p Fall:   Xray negative for acute findings Likely traumatic bursitis eRx for Pred Taper x 6 days- monitor sugars Encouraged ice to help with pain Ok to continue knee brace Follow up with PCP in 1 week if no improvement, may need PT  I reviewed the patient's prescription history over the last 12 months in the multi-state controlled substances database(s) that includes [place], [place], [place], [place], [place], [place], [place], [place], [place], [place], and [place].  Results were notable for RX for Hydrocodone on 09/14/17 (# 12), 09/17/17 ( #15). ____________________________________________  FINAL CLINICAL IMPRESSION(S) / ED DIAGNOSES  Final diagnoses:  Acute pain of left knee  Fall, initial encounter      Lorre MunroeBaity, Calah Gershman W, NP 10/24/17 1456    Myrna BlazerSchaevitz, David Matthew, MD 10/24/17 [phone]

## 2017-11-09 ENCOUNTER — Other Ambulatory Visit: Payer: Self-pay | Admitting: Family Medicine

## 2017-11-11 ENCOUNTER — Telehealth: Payer: Self-pay | Admitting: Family Medicine

## 2017-11-11 NOTE — Telephone Encounter (Signed)
[name] Walker with [employer] RN with the support team called advising that pt was seen in the ER on 10/24/17 for knee pain. [name] Walker suggested that pt may need a Follow-up visit since pt is diabetic and he doesn't check his sugar daily nor does he check his BP. Please advise. Thanks TNP

## 2017-11-12 NOTE — Telephone Encounter (Signed)
You can try but he was not pleased with me after his wife called and told me he was binge drinking and not following dietary precautions. You may wish to offer him an appointment with a different provider if he does not want to see me.

## 2017-11-12 NOTE — Telephone Encounter (Signed)
Patient has no future follow up appt, okay to call patient to have him schedule follow up? kW

## 2017-11-12 NOTE — Telephone Encounter (Signed)
[id number] 

## 2017-11-16 NOTE — Telephone Encounter (Signed)
[id number] 

## 2017-11-19 ENCOUNTER — Other Ambulatory Visit: Payer: Self-pay | Admitting: Family Medicine

## 2017-12-25 ENCOUNTER — Other Ambulatory Visit: Payer: Self-pay | Admitting: Family Medicine

## 2017-12-25 DIAGNOSIS — E119 Type 2 diabetes mellitus without complications: Secondary | ICD-10-CM

## 2017-12-30 ENCOUNTER — Telehealth: Payer: Self-pay | Admitting: Family Medicine

## 2017-12-30 ENCOUNTER — Encounter: Payer: Self-pay | Admitting: Family Medicine

## 2017-12-30 ENCOUNTER — Other Ambulatory Visit: Payer: Self-pay | Admitting: Family Medicine

## 2017-12-30 ENCOUNTER — Ambulatory Visit: Payer: BC Managed Care – PPO | Admitting: Family Medicine

## 2017-12-30 VITALS — BP 142/100 | HR 91 | Temp 99.1°F | Resp 16 | Wt 318.0 lb

## 2017-12-30 DIAGNOSIS — N5089 Other specified disorders of the male genital organs: Secondary | ICD-10-CM | POA: Diagnosis not present

## 2017-12-30 MED ORDER — POTASSIUM CHLORIDE CRYS ER 20 MEQ PO TBCR: 20.0000 meq | EXTENDED_RELEASE_TABLET | Freq: Every day | ORAL | 0 refills | Status: DC

## 2017-12-30 MED ORDER — FUROSEMIDE 40 MG PO TABS: 40.0000 mg | ORAL_TABLET | Freq: Every day | ORAL | 0 refills | Status: DC

## 2017-12-30 NOTE — Progress Notes (Signed)
  Subjective:     Patient ID: [name] Walker, male   DOB: 10/30/1975, 42 y.o.   MRN: [id number] Chief Complaint  Patient presents with  . Groin Swelling    Patient reports swelling of both testicles for the past week. Patient denies any pain or trauma to area, patient states prior to swelling he believes he had a rash near his scrotum and reports irritation and itching he states that he had applied Nystatin cream to area and had some relief.  . Flank Pain    Patient reports pain on the right side for the past 3 days or more that he describes as sharp.   HPI States he has noticed the generalized swelling of his genital area over the last two weeks. Has not been taking glipizide for his diabetes as it made him feel "funny" on one occasion. Reports compliance with bp medication. Current ETOH use is 1-2 beers daily. Reports increased compliance with C-Pap and acknowledges increased energy and ability to stay awake. He is much more alert in the office today than in the past.  Review of Systems     Objective:   Physical Exam  Constitutional: He appears well-developed and well-nourished. No distress.  Abdominal: He exhibits distension (tense). There is no tenderness. There is no guarding. A hernia (periumbllical) is present.  Genitourinary:  Genitourinary Comments: Edema of scrotum and around his penis (circumcised) but able to retract swelling around his penis.  Musculoskeletal: He exhibits no edema (no pedal edema).       Assessment:    1. Scrotal edema - Renal function panel - US Scrotum; Future - furosemide (LASIX) 40 MG tablet; Take 1 tablet (40 mg total) by mouth daily.  Dispense: 14 tablet; Refill: 0    Plan:    Continue current medication for hypertension. May resume glipizide pending review of labs. Further f/u pending lab and ultrasound results.

## 2017-12-30 NOTE — Telephone Encounter (Signed)
Spoke with patient and Nadine Counts who okayed for patient to continue use of Nystatin cream or patient can try clotrimazole. Patent understood. Patient wanted to know how long should he be using cream for? And wanted to know if by chance those to issues were related to the cause of swelling? KW

## 2017-12-30 NOTE — Telephone Encounter (Signed)
Patient called wanting to know if he needed a cream for the irritated area??

## 2017-12-30 NOTE — Patient Instructions (Signed)
We will call you with the lab result and ultrasound scheduling.

## 2017-12-31 LAB — RENAL FUNCTION PANEL
Albumin: 3.6 g/dL (ref 3.5–5.5)
BUN / CREAT RATIO: 8 — AB (ref 9–20)
BUN: 8 mg/dL (ref 6–24)
CO2: 27 mmol/L (ref 20–29)
CREATININE: 0.99 mg/dL (ref 0.76–1.27)
Calcium: 9.3 mg/dL (ref 8.7–10.2)
Chloride: 98 mmol/L (ref 96–106)
GFR calc non Af Amer: 95 mL/min/{1.73_m2} (ref >59)
GFR, EST AFRICAN AMERICAN: 110 mL/min/{1.73_m2} (ref >59)
GLUCOSE: 139 mg/dL — AB (ref 65–99)
POTASSIUM: 3.2 mmol/L — AB (ref 3.5–5.2)
Phosphorus: 3.2 mg/dL (ref 2.5–4.5)
SODIUM: 142 mmol/L (ref 134–144)

## 2017-12-31 NOTE — Telephone Encounter (Signed)
-----   Message from Anola Gurney, [place] sent at 12/31/2017  7:27 AM EDT ----- Kidney function is ok. Potassium is mildly decreased but should improve with the supplement. Sugar is 139. May wish to resume glipizide 30 minutes before his biggest meal of the day. Further f/u pending ultrasound results.

## 2017-12-31 NOTE — Telephone Encounter (Signed)
Swelling not related to rash. Use clotrimazole for at least two weeks then one more week after the rash goes away.Powdering in this area after bath and before work may keep it from coming back.

## 2017-12-31 NOTE — Telephone Encounter (Signed)
[id number]

## 2018-01-04 ENCOUNTER — Ambulatory Visit
Admission: RE | Admit: 2018-01-04 | Discharge: 2018-01-04 | Disposition: A | Discharge status: Home / Self Care | Payer: BC Managed Care – PPO | Source: Ambulatory Visit | Attending: Family Medicine | Admitting: Family Medicine

## 2018-01-04 DIAGNOSIS — I861 Scrotal varices: Secondary | ICD-10-CM | POA: Diagnosis not present

## 2018-01-04 DIAGNOSIS — N5089 Other specified disorders of the male genital organs: Secondary | ICD-10-CM | POA: Diagnosis present

## 2018-01-04 NOTE — Telephone Encounter (Signed)
Patient advised.KW 

## 2018-02-18 ENCOUNTER — Other Ambulatory Visit: Payer: Self-pay | Admitting: Family Medicine

## 2018-02-18 ENCOUNTER — Telehealth: Payer: Self-pay | Admitting: Family Medicine

## 2018-02-18 MED ORDER — ATORVASTATIN CALCIUM 80 MG PO TABS: 80.0000 mg | ORAL_TABLET | Freq: Every day | ORAL | 3 refills | Status: DC

## 2018-02-18 NOTE — Telephone Encounter (Signed)
done

## 2018-02-18 NOTE — Telephone Encounter (Signed)
[facility] faxed a request for you to send in atorvastatin (LIPITOR) 80  MG tablet instead of the 40mg .  They state that insurance will only pay for one tablet daily.  Please advise.

## 2018-03-11 ENCOUNTER — Other Ambulatory Visit: Payer: Self-pay | Admitting: Family Medicine

## 2018-03-11 ENCOUNTER — Encounter: Payer: Self-pay | Admitting: Family Medicine

## 2018-03-11 DIAGNOSIS — Z8739 Personal history of other diseases of the musculoskeletal system and connective tissue: Secondary | ICD-10-CM | POA: Insufficient documentation

## 2018-03-11 DIAGNOSIS — M1000 Idiopathic gout, unspecified site: Secondary | ICD-10-CM

## 2018-03-11 MED ORDER — PREDNISONE 10 MG PO TABS: ORAL_TABLET | ORAL | 0 refills | Status: DC

## 2018-03-11 NOTE — Telephone Encounter (Signed)
Patient advised.KW 

## 2018-03-11 NOTE — Telephone Encounter (Signed)
I have sent in a 6 day prednisone taper. If not improved would want to see him in the office. He is also overdue for f/u of diabetes.

## 2018-03-11 NOTE — Telephone Encounter (Signed)
Pt states he is having a gout flare up and is  Requesting something be called into [facility] on [address]. Pt states provider is aware of flare ups but has not prescribed anything for it in the past.  States he is on his feet all day and has already missed a few days of work.

## 2018-03-11 NOTE — Telephone Encounter (Signed)
Please advise. KW 

## 2018-05-10 ENCOUNTER — Other Ambulatory Visit: Payer: Self-pay | Admitting: Family Medicine

## 2018-05-10 ENCOUNTER — Telehealth: Payer: Self-pay | Admitting: Family Medicine

## 2018-05-10 DIAGNOSIS — I10 Essential (primary) hypertension: Secondary | ICD-10-CM

## 2018-05-10 DIAGNOSIS — N5089 Other specified disorders of the male genital organs: Secondary | ICD-10-CM

## 2018-05-10 MED ORDER — FUROSEMIDE 40 MG PO TABS: ORAL_TABLET | ORAL | 0 refills | Status: DC

## 2018-05-10 MED ORDER — HYDROCHLOROTHIAZIDE 25 MG PO TABS: 25.0000 mg | ORAL_TABLET | Freq: Every day | ORAL | 3 refills | Status: DC

## 2018-05-10 NOTE — Telephone Encounter (Signed)
Patient was advised.  

## 2018-05-10 NOTE — Telephone Encounter (Signed)
Have refilled HCTZ and furosemide prn pending office visit.

## 2018-05-10 NOTE — Telephone Encounter (Signed)
Patient wants to talk to Nadine Counts about a scrotal issue.  He has an appt on Monday 05/17/17 to see bob for this.

## 2018-05-17 ENCOUNTER — Other Ambulatory Visit: Payer: Self-pay

## 2018-05-17 ENCOUNTER — Other Ambulatory Visit: Payer: Self-pay | Admitting: Family Medicine

## 2018-05-17 ENCOUNTER — Ambulatory Visit: Payer: BC Managed Care – PPO | Admitting: Family Medicine

## 2018-05-17 ENCOUNTER — Encounter: Payer: Self-pay | Admitting: Family Medicine

## 2018-05-17 VITALS — BP 144/84 | HR 110 | Temp 98.3°F | Ht 68.0 in | Wt 328.0 lb

## 2018-05-17 DIAGNOSIS — N5089 Other specified disorders of the male genital organs: Secondary | ICD-10-CM | POA: Diagnosis not present

## 2018-05-17 DIAGNOSIS — I861 Scrotal varices: Secondary | ICD-10-CM

## 2018-05-17 DIAGNOSIS — E119 Type 2 diabetes mellitus without complications: Secondary | ICD-10-CM

## 2018-05-17 DIAGNOSIS — R600 Localized edema: Secondary | ICD-10-CM

## 2018-05-17 DIAGNOSIS — M545 Low back pain, unspecified: Secondary | ICD-10-CM

## 2018-05-17 DIAGNOSIS — G8929 Other chronic pain: Secondary | ICD-10-CM

## 2018-05-17 NOTE — Patient Instructions (Signed)
Do get your back x-ray. We will call you with the lab results and about the urology referral. For now use Tylenol arthritis strength for pain.

## 2018-05-17 NOTE — Progress Notes (Signed)
  Subjective:     Patient ID: [name] Walker, male   DOB: 1976-03-03, 43 y.o.   MRN: [id number] Chief Complaint  Patient presents with  . Groin Swelling    scrotum area again since 05/03/18  . Back Pain    and in hips   HPI States he feels that his scrotum has become less swollen after resuming the diuretics. Prior ultrasound revealed bilateral varicoceles. Continues to have chronic left sided low back with occasional radiation down the lateral aspect of his left leg. Denies recent falls but has hx in 2017 of an L1 compression fx.  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 No longer taking glipizide with intermittent use of C-Pap. ETOH intake "three beers or mixed drinks daily.  Review of Systems     Objective:   Physical Exam Constitutional:      General: He is in acute distress (mild discomfort changing positions.).  Pulmonary:     Effort: Pulmonary effort is normal.     Breath sounds: Normal breath sounds.  Genitourinary:    Comments: Scrotum mildly swollen without hernia detected. Don't feel varicoceles at this time. No tenderness. Musculoskeletal:     Right lower leg: Edema (1 + pedal) present.     Left lower leg: Edema (1+ pedal) present.     Comments: Muscle strength in lower extremities 5/5. SLR's to 90 degrees without radiation of back pain. Localizes pain to L SI area with mild tenderness on palpation. No left trochanteric area tenderness  Neurological:     Mental Status: He is alert.        Assessment:    1. Scrotal edema - Renal function panel - Ambulatory referral to Urology  2. Bilateral varicoceles: referral to urology  3. Diabetes mellitus without complication (HCC): renal profile  4. Pedal edema - Renal function panel  5. Chronic left-sided low back pain without sciatica: Lumbar x-rAY    Plan:    Further f/u pending lab and x-ray results. Tylenol arthritis strength for pain.

## 2018-05-18 ENCOUNTER — Other Ambulatory Visit: Payer: Self-pay | Admitting: Family Medicine

## 2018-05-18 ENCOUNTER — Telehealth: Payer: Self-pay

## 2018-05-18 LAB — RENAL FUNCTION PANEL
Albumin: 3.5 g/dL — ABNORMAL LOW (ref 4.0–5.0)
BUN/Creatinine Ratio: 8 — ABNORMAL LOW (ref 9–20)
BUN: 8 mg/dL (ref 6–24)
CALCIUM: 9.1 mg/dL (ref 8.7–10.2)
CHLORIDE: 96 mmol/L (ref 96–106)
CO2: 27 mmol/L (ref 20–29)
Creatinine, Ser: 0.99 mg/dL (ref 0.76–1.27)
GFR calc non Af Amer: 94 mL/min/{1.73_m2} (ref >59)
GFR, EST AFRICAN AMERICAN: 109 mL/min/{1.73_m2} (ref >59)
GLUCOSE: 225 mg/dL — AB (ref 65–99)
PHOSPHORUS: 3.3 mg/dL (ref 2.8–4.1)
POTASSIUM: 3.8 mmol/L (ref 3.5–5.2)
Sodium: 141 mmol/L (ref 134–144)

## 2018-05-18 MED ORDER — GLIMEPIRIDE 2 MG PO TABS: 2.0000 mg | ORAL_TABLET | Freq: Every day | ORAL | 0 refills | Status: DC

## 2018-05-18 NOTE — Telephone Encounter (Signed)
-----   Message from Anola Gurney, [place] sent at 05/18/2018  7:29 AM EST ----- Kidney function is ok but sugar is not well controlled. Are you willing to try another diabetes medication?

## 2018-05-18 NOTE — Telephone Encounter (Signed)
Patient advised, please send in script to [facility] [address] KW

## 2018-05-18 NOTE — Telephone Encounter (Signed)
See below

## 2018-05-18 NOTE — Telephone Encounter (Signed)
Patient advised and appt arranged for 05/31/18. KW

## 2018-05-18 NOTE — Telephone Encounter (Signed)
I have sent in a new medication to take once daily before breakfast. Have him f/u in one to two weeks.

## 2018-05-31 ENCOUNTER — Ambulatory Visit: Payer: Self-pay | Admitting: Family Medicine

## 2018-06-02 ENCOUNTER — Telehealth: Payer: Self-pay | Admitting: Family Medicine

## 2018-06-02 ENCOUNTER — Other Ambulatory Visit: Payer: Self-pay | Admitting: Family Medicine

## 2018-06-02 NOTE — Telephone Encounter (Signed)
Pt's wife Avrey Tanaka needs to speak with the Maurine Minister regarding pt's narcolepsy issues. What are the next steps because safety for family is involved with driving, ect...  Please advise.  Thanks, [employer]

## 2018-06-02 NOTE — Telephone Encounter (Signed)
Please review

## 2018-06-03 NOTE — Telephone Encounter (Signed)
Sorry, I haven't seen this patient and he doesn't have a narcolepsy diagnosis in his chart. But, he does have sleep apnea which can cause him to fall asleep quickly if he is not using the CPAP, keeping the diabetes in control and losing some weight. Recommend he keep the appointment with Nadine Counts on 06-07-18 as scheduled to see if he needs a neurology referral for narcolepsy.

## 2018-06-04 NOTE — Telephone Encounter (Signed)
Spoke to pt and advised of his wifes concerns.  Pt advised that he would be in for his appt on 06/07/18

## 2018-06-07 ENCOUNTER — Other Ambulatory Visit: Payer: Self-pay | Admitting: Family Medicine

## 2018-06-07 ENCOUNTER — Encounter: Payer: Self-pay | Admitting: Family Medicine

## 2018-06-07 ENCOUNTER — Ambulatory Visit (INDEPENDENT_AMBULATORY_CARE_PROVIDER_SITE_OTHER): Payer: BC Managed Care – PPO | Admitting: Family Medicine

## 2018-06-07 VITALS — BP 160/118 | HR 98 | Temp 98.3°F | Resp 16 | Wt 329.2 lb

## 2018-06-07 DIAGNOSIS — I10 Essential (primary) hypertension: Secondary | ICD-10-CM | POA: Diagnosis not present

## 2018-06-07 DIAGNOSIS — E119 Type 2 diabetes mellitus without complications: Secondary | ICD-10-CM | POA: Diagnosis not present

## 2018-06-07 DIAGNOSIS — N5089 Other specified disorders of the male genital organs: Secondary | ICD-10-CM

## 2018-06-07 DIAGNOSIS — G4733 Obstructive sleep apnea (adult) (pediatric): Secondary | ICD-10-CM

## 2018-06-07 LAB — GLUCOSE, POCT (MANUAL RESULT ENTRY): POC GLUCOSE: 135 mg/dL — AB (ref 70–99)

## 2018-06-07 NOTE — Patient Instructions (Signed)
Try taking the diabetes medication with the biggest meal of the day. If you continue to have the shakes split the pill and take 1/2. Resume taking your two blood pressures medications: metoprolol and HCTZ. Do resume nightly C-Pap. Do follow up with the urologist.

## 2018-06-07 NOTE — Progress Notes (Signed)
  Subjective:     Patient ID: [name] Walker, male   DOB: Aug 23, 1975, 43 y.o.   MRN: [id number] Chief Complaint  Patient presents with  . Follow-up    Patient returns to office today for two week follow up for scrotal/pedal edema. At last office visit 05/17/18 patient was referred out to urology  . Diabetes    Patient returns for two week follow up from 05/17/18, at last visit Renal panel was ordered. Last HgbA1C was on 07/07/17 at 7.7%. Patient reports fair compliance on medication, patient states that he notices when he takes medication it causes him to have the "shakes."Patient reports polydipsia but denies polyuria, he has not had any visual changes or changes to his feet for wounds or sores.    HPI States he has been taking the glimepiride with breakfast his smallest meal of the day. Subsequently he has been getting the "shakes" later in the morning. He has not been using the C-Pap for 3 months. Wife who accompanies him is concerned about him falling asleep during the day. Also has not refilled his HCTZ or metoprolol. He has appointment pending with urology for recurrent scrotal edema.  Review of Systems     Objective:   Physical Exam Constitutional:      General: He is not in acute distress. Cardiovascular:     Rate and Rhythm: Normal rate and regular rhythm.  Pulmonary:     Effort: Pulmonary effort is normal.     Breath sounds: Normal breath sounds.  Musculoskeletal:     Right lower leg: No edema.     Left lower leg: No edema.  Neurological:     Mental Status: He is alert.        Assessment:    1. Diabetes mellitus without complication (HCC) - POCT glucose (manual entry)  2. Essential (primary) hypertension  3. OSA (obstructive sleep apnea)    Plan:    Take 1/2 to one glimepiride before the biggest meal of the day. Glucometer provided. Resume bp medication and C-Pap. Follow up with urology as scheduled.

## 2018-06-14 ENCOUNTER — Ambulatory Visit (INDEPENDENT_AMBULATORY_CARE_PROVIDER_SITE_OTHER): Payer: BC Managed Care – PPO | Admitting: Urology

## 2018-06-14 ENCOUNTER — Encounter: Payer: Self-pay | Admitting: Urology

## 2018-06-14 VITALS — BP 172/113 | HR 109 | Ht 68.0 in | Wt 329.0 lb

## 2018-06-14 DIAGNOSIS — N5089 Other specified disorders of the male genital organs: Secondary | ICD-10-CM | POA: Diagnosis not present

## 2018-06-14 NOTE — Progress Notes (Signed)
06/14/2018 12:02 PM   [name] Walker 01-19-1976 [id number]  Referring provider: Anola Gurney, PA [address] [place], [place] [address]  CC: Scrotal edema  HPI: I saw [name] Walker in urology clinic today in consultation for scrotal edema from Anola Gurney, [place].  He is a morbidly obese 43 year old African-American male with BMI of 50 who has had intermittent scrotal swelling.  He also has lower extremity edema, which he notices is worse at the end of the day.  He has been started on Lasix by his PCP, which he states has improved his scrotal swelling and lower extremity edema.  He had a scrotal ultrasound in September 2019 which was benign.  He denies any scrotal or penile pain.  He has nocturia 3 times per night, and is noncompliant with his CPAP for diagnosis of sleep apnea.  He drinks multiple beers in the evening.  He denies any significant urinary symptoms during the day.  He denies a family history of prostate cancer.  He denies urinary symptoms during the day.  There are no aggravating factors.  Severity is mild.   PMH: Past Medical History:  Diagnosis Date  . Hypertension     Surgical History: Past Surgical History:  Procedure Laterality Date  . TONSILLECTOMY  1985    Allergies:  Allergies  Allergen Reactions  . Ace Inhibitors Cough  . Amlodipine     Pedal edema  . Metformin And Related     diarrhea  . Other     Family History: Family History  Problem Relation Age of Onset  . Diabetes Mother   . Cancer Mother        lung    Social History:  reports that he has been smoking cigarettes. He has never used smokeless tobacco. He reports current alcohol use. He reports that he does not use drugs.  ROS: Please see flowsheet from today's date for complete review of systems.  Physical Exam: BP (!) 172/113 (BP Location: Left Arm, Patient Position: Sitting, Cuff Size: Large)   Pulse (!) 109   Ht 5\' 8"  (1.727 m)   Wt (!) 329 lb (149.2 kg)   BMI 50.02  kg/m    Constitutional:  Alert and oriented, No acute distress. Cardiovascular: No clubbing, cyanosis, or edema. Respiratory: Normal respiratory effort, no increased work of breathing. GI: Abdomen is soft, nontender, nondistended, no abdominal masses GU: No CVA tenderness, phallus without lesions, widely patent meatus, testicles 20 cc and descended bilaterally, no masses, patient is obese but there is no significant scrotal edema. Lymph: No cervical or inguinal lymphadenopathy. Skin: No rashes, bruises or suspicious lesions. Neurologic: Grossly intact, no focal deficits, moving all 4 extremities. Psychiatric: Normal mood and affect.  Laboratory Data: Reviewed  Pertinent Imaging: I have personally reviewed the scrotal ultrasound from September 2019, no significant abnormalities  Assessment & Plan:   In summary, the patient is a morbidly obese 43 year old male referred for scrotal edema.  The patient states this has improved since starting Lasix.  His scrotal ultrasound and physical exam are normal and reassuring.  I discussed at length with the patient that this is likely secondary to his morbid obesity and lower extremity edema.  We also discussed the strong correlation between nocturia, and poorly controlled sleep apnea.  -Recommended ongoing scrotal support, weight loss, compliance with CPAP, and lower extremity compression -RTC PRN  Sondra Come, MD  [facility] [address] [place], [place] [address] [phone]

## 2018-06-14 NOTE — Patient Instructions (Signed)
Scrotal Swelling  Scrotal swelling refers to a condition in which the sac of skin that contains the testes (scrotum) is enlarged or swollen. Many things can cause the scrotum to enlarge or swell, including:   Fluid around the testicle (hydrocele).   A weakened area in the muscles around the groin (hernia).   An enlarged vein around the testicle (varicocele).   An injury.   An infection.   Certain medical treatments.   Certain medical conditions, such as congestive heart failure.   A recent genital surgery or procedure.   A twisting of the spermatic cord that cuts off blood supply (testicular torsion).   Testicular cancer.  Scrotal swelling can happen along with scrotal pain.  Follow these instructions at home:   Until the swelling goes away:  ? Rest. The best position to rest in is to lie down.  ? Limit activity.   Put ice on the scrotum:  ? Put ice in a plastic bag.  ? Place a towel between your skin and the bag.  ? Leave the ice on for 20 minutes, 2-3 times a day for 1-2 days.   Place a rolled towel under your testicles for support.   Wear loose-fitting clothing or an athletic support cup for comfort.   Take over-the-counter and prescription medicines only as told by your health care provider.   Perform a monthly self-exam of the scrotum and penis. Feel for changes. Ask your health care provider how to perform a monthly self-exam if you are unsure.  Contact a health care provider if:   You have a sudden pain that is persistent and does not improve.   You have a heavy feeling or notice fluid in the scrotum.   You have pain or burning while urinating.   You have blood in your urine or semen.   You feel a lump around the testicle.   You notice that one testicle is larger than the other. Keep in mind that a small difference in size is normal.   You have a persistent dull ache or pain in your groin or scrotum.  Get help right away if:   The pain does not go away.   The pain becomes  severe.   You have a fever or chills.   You have pain or vomiting that cannot be controlled.   One or both sides of the scrotum are very red and swollen.   There is redness spreading upward from your scrotum to your abdomen or downward from your scrotum to your thighs.  Summary   Scrotal swelling refers to a condition in which the sac of skin that contains the testes (scrotum) is enlarged.   Many things can cause the scrotum to swell, including hydrocele, a hernia, and a varicocele.   Limiting activity and icing the scrotum may help reduce swelling and pain.   Contact your health care provider if you develop scrotal pain that is sudden and persistent, or if you have pain while urinating. Do this also if you feel a lump around the testicle or notice blood in your urine or semen.   Get help right away for uncontrolled pain or vomiting, for very red and swollen scrotum, or for fever or chills.  This information is not intended to replace advice given to you by your health care provider. Make sure you discuss any questions you have with your health care provider.  Document Released: 05/17/2010 Document Revised: 06/30/2016 Document Reviewed: 06/30/2016  Elsevier Interactive Patient Education    2019 Elsevier Inc.

## 2018-06-21 ENCOUNTER — Ambulatory Visit: Payer: Self-pay | Admitting: Family Medicine

## 2018-09-08 ENCOUNTER — Encounter: Payer: BC Managed Care – PPO | Admitting: Family Medicine

## 2018-09-08 ENCOUNTER — Encounter: Payer: Self-pay | Admitting: Family Medicine

## 2018-09-08 NOTE — Progress Notes (Signed)
    Patient not available at time of evisit and unable to be reached by phone   This encounter was created in error - please disregard. 

## 2018-09-09 ENCOUNTER — Other Ambulatory Visit: Payer: Self-pay

## 2018-09-09 ENCOUNTER — Encounter: Payer: Self-pay | Admitting: Physician Assistant

## 2018-09-09 ENCOUNTER — Ambulatory Visit: Payer: BC Managed Care – PPO | Admitting: Physician Assistant

## 2018-09-09 VITALS — BP 170/117 | HR 116 | Temp 99.1°F | Wt 330.4 lb

## 2018-09-09 DIAGNOSIS — M5416 Radiculopathy, lumbar region: Secondary | ICD-10-CM

## 2018-09-09 DIAGNOSIS — I10 Essential (primary) hypertension: Secondary | ICD-10-CM | POA: Diagnosis not present

## 2018-09-09 DIAGNOSIS — E119 Type 2 diabetes mellitus without complications: Secondary | ICD-10-CM | POA: Diagnosis not present

## 2018-09-09 DIAGNOSIS — E782 Mixed hyperlipidemia: Secondary | ICD-10-CM

## 2018-09-09 MED ORDER — MELOXICAM 7.5 MG PO TABS: ORAL_TABLET | ORAL | 0 refills | Status: DC

## 2018-09-09 MED ORDER — GABAPENTIN 300 MG PO CAPS: 300.0000 mg | ORAL_CAPSULE | Freq: Three times a day (TID) | ORAL | 0 refills | Status: DC

## 2018-09-09 MED ORDER — LOSARTAN POTASSIUM 50 MG PO TABS: 50.0000 mg | ORAL_TABLET | Freq: Every day | ORAL | 0 refills | Status: DC

## 2018-09-09 NOTE — Progress Notes (Signed)
Patient: [name] Walker Male    DOB: 1976-04-22   43 y.o.   MRN: [id number] Visit Date: 09/10/2018  Today's Provider: Trey Sailors, PA-C   Chief Complaint  Patient presents with  . Back Pain   Subjective:    Back Pain  This is a recurrent problem. The current episode started more than 1 year ago. The problem occurs constantly. The problem has been gradually worsening since onset. The quality of the pain is described as aching. The pain radiates to the left thigh, right thigh, right knee and left knee. The pain is at a severity of 2/10. The pain is mild. The pain is worse during the night. The symptoms are aggravated by lying down and standing. Stiffness is present in the morning. Associated symptoms include headaches. He has tried NSAIDs for the symptoms. The treatment provided mild relief.   Patient underwent CT lumbar spine wo contrast [facility] on [facility]/22/2016 with the following findings:   FINDINGS:  Mild compression fracture of the superior endplate of L1. Alignment of the lumbar spine is maintained. Mild lumbar levoscoliosis.  Endplate sclerosis seen at multiple levels likely reactive to intervertebral disc disease. Posterior disc herniations at L3-L4 and L4-L5 with at least mild spinal canal narrowing at L4-L5. Mild-to-moderate left-sided neural foraminal stenosis. Facet joint hypertrophy lumbosacral junction.  Bridging anterior osteophyte with bony fusion superior right sacroiliac joint.  Diabetes: Patient has had documented diabetes since 09/29/2016 and last A1C on 07/07/2017 was 7.7%. He reports trying Metformin in the past but had intolerable diarrhea. He was then placed on glimepiride which he is not taking because he reports it made him feel weird. He does not have routine diabetic care.  Morbid Obesity  Wt Readings from Last 3 Encounters:  09/09/18 (!) 330 lb 6.4 oz (149.9 kg)  06/14/18 (!) 329 lb (149.2 kg)  06/07/18 (!) 329 lb 3.2 oz (149.3 kg)   HTN: Currently  taking HCTZ 25 mg daily and metoprolol succinate 50 mg QD. Previously took Lisinopril which caused dry cough and amlodipine which caused foot swelling. Was prescribed Losartan at one point but this was expensive at the pharmacy.   BP Readings from Last 3 Encounters:  09/09/18 (!) 170/117  06/14/18 (!) 172/113  06/07/18 (!) 160/118     Allergies  Allergen Reactions  . Ace Inhibitors Cough  . Amlodipine     Pedal edema  . Metformin And Related     diarrhea  . Other      Current Outpatient Medications:  .  atorvastatin (LIPITOR) 80 MG tablet, Take 1 tablet (80 mg total) by mouth daily., Disp: 90 tablet, Rfl: 3 .  busPIRone (BUSPAR) 15 MG tablet, TAKE 1 TABLET BY MOUTH TWICE DAILY, Disp: 60 tablet, Rfl: 5 .  furosemide (LASIX) 40 MG tablet, TAKE 1 TABLET BY MOUTH ONCE DAILY AS NEEDED FOR  SCROTAL  SWELLING, Disp: 14 tablet, Rfl: 0 .  hydrochlorothiazide (HYDRODIURIL) 25 MG tablet, Take 1 tablet (25 mg total) by mouth daily., Disp: 90 tablet, Rfl: 3 .  ibuprofen (ADVIL,MOTRIN) 600 MG tablet, Take 600 mg by mouth every 6 (six) hours as needed., Disp: , Rfl:  .  metoprolol succinate (TOPROL-XL) 50 MG 24 hr tablet, TAKE 1 TABLET BY MOUTH ONCE DAILY (TAKE  WITH  OR  IMMEDIATELY  FOLLOWING  A  MEAL), Disp: 90 tablet, Rfl: 1 .  NON FORMULARY, CPAP everynight, Disp: , Rfl:  .  gabapentin (NEURONTIN) 300 MG capsule, Take 1  capsule (300 mg total) by mouth 3 (three) times daily., Disp: 90 capsule, Rfl: 0 .  losartan (COZAAR) 50 MG tablet, Take 1 tablet (50 mg total) by mouth daily., Disp: 90 tablet, Rfl: 0 .  meloxicam (MOBIC) 7.5 MG tablet, Take 1-2 tablets daily for pain. Take with a little food., Disp: 30 tablet, Rfl: 0  Review of Systems  Constitutional: Negative.   Respiratory: Negative.   Genitourinary: Negative.   Musculoskeletal: Positive for back pain.  Neurological: Positive for headaches.    Social History   Tobacco Use  . Smoking status: Current Some Day Smoker    Types:  Cigarettes  . Smokeless tobacco: Never Used  Substance Use Topics  . Alcohol use: Yes      Objective:   BP (!) 170/117 (BP Location: Left Arm, Patient Position: Sitting, Cuff Size: Large)   Pulse (!) 116   Temp 99.1 F (37.3 C) (Oral)   Wt (!) 330 lb 6.4 oz (149.9 kg)   BMI 50.24 kg/m  Vitals:   09/09/18 0948  BP: (!) 170/117  Pulse: (!) 116  Temp: 99.1 F (37.3 C)  TempSrc: Oral  Weight: (!) 330 lb 6.4 oz (149.9 kg)     Physical Exam Constitutional:      Appearance: Normal appearance. He is obese.  Cardiovascular:     Rate and Rhythm: Normal rate and regular rhythm.     Heart sounds: Normal heart sounds.  Musculoskeletal:     Lumbar back: He exhibits decreased range of motion and pain. He exhibits no tenderness.  Skin:    General: Skin is warm and dry.  Neurological:     Mental Status: He is alert. Mental status is at baseline.         Assessment & Plan    1. Lumbar radiculopathy  Will treat as below and follow up in one month.   - meloxicam (MOBIC) 7.5 MG tablet; Take 1-2 tablets daily for pain. Take with a little food.  Dispense: 30 tablet; Refill: 0 - gabapentin (NEURONTIN) 300 MG capsule; Take 1 capsule (300 mg total) by mouth 3 (three) times daily.  Dispense: 90 capsule; Refill: 0  2. Mixed hyperlipidemia  Above goal, will need to discuss statin in the future.   - Lipid Profile  3. Diabetes mellitus without complication (HCC)  A1c is 8.3% which is increased from prior and uncontrolled. He is intolerant to metformin and glimepiride and we will need to discuss alternatives at follow up visit, likely trulicity. Will refer him to CCM for diabetes education and eating plan.   - HgB A1c - Comprehensive Metabolic Panel (CMET) - CBC with Differential - Urine Microalbumin w/creat. ratio - Ambulatory referral to Chronic Care Management Services  4. Essential (primary) hypertension  Add losartan. He had dry cough with Lisinopril. Previously prescribed  losartan but for some reason it was very expensive. Have advised patient this is on the [facility] at [facility] and if it is very expensive to ask for cash price.   - losartan (COZAAR) 50 MG tablet; Take 1 tablet (50 mg total) by mouth daily.  Dispense: 90 tablet; Refill: 0  5. Morbid obesity (HCC)  Referring to CCM. Plan to start diabetes medication that will also aid in weight loss.  The entirety of the information documented in the History of Present Illness, Review of Systems and Physical Exam were personally obtained by me. Portions of this information were initially documented by [facility], CMA and reviewed by me for thoroughness  and accuracy.      Trey Sailors, PA-C  [facility]

## 2018-09-10 LAB — LIPID PANEL
Chol/HDL Ratio: 3.7 ratio (ref 0.0–5.0)
Cholesterol, Total: 163 mg/dL (ref 100–199)
HDL: 44 mg/dL (ref >39)
LDL Calculated: 99 mg/dL (ref 0–99)
Triglycerides: 101 mg/dL (ref 0–149)
VLDL Cholesterol Cal: 20 mg/dL (ref 5–40)

## 2018-09-10 LAB — COMPREHENSIVE METABOLIC PANEL
ALT: 42 IU/L (ref 0–44)
AST: 41 IU/L — ABNORMAL HIGH (ref 0–40)
Albumin/Globulin Ratio: 1.2 (ref 1.2–2.2)
Albumin: 3.8 g/dL — ABNORMAL LOW (ref 4.0–5.0)
Alkaline Phosphatase: 90 IU/L (ref 39–117)
BUN/Creatinine Ratio: 9 (ref 9–20)
BUN: 9 mg/dL (ref 6–24)
Bilirubin Total: 0.3 mg/dL (ref 0.0–1.2)
CO2: 23 mmol/L (ref 20–29)
Calcium: 9.1 mg/dL (ref 8.7–10.2)
Chloride: 98 mmol/L (ref 96–106)
Creatinine, Ser: 1.05 mg/dL (ref 0.76–1.27)
GFR calc Af Amer: 101 mL/min/{1.73_m2} (ref >59)
GFR calc non Af Amer: 88 mL/min/{1.73_m2} (ref >59)
Globulin, Total: 3.1 g/dL (ref 1.5–4.5)
Glucose: 167 mg/dL — ABNORMAL HIGH (ref 65–99)
Potassium: 3.8 mmol/L (ref 3.5–5.2)
Sodium: 140 mmol/L (ref 134–144)
Total Protein: 6.9 g/dL (ref 6.0–8.5)

## 2018-09-10 LAB — CBC WITH DIFFERENTIAL/PLATELET
Basophils Absolute: 0 10*3/uL (ref 0.0–0.2)
Basos: 1 %
EOS (ABSOLUTE): 0.1 10*3/uL (ref 0.0–0.4)
Eos: 2 %
Hematocrit: 45.7 % (ref 37.5–51.0)
Hemoglobin: 15.1 g/dL (ref 13.0–17.7)
Immature Grans (Abs): 0 10*3/uL (ref 0.0–0.1)
Immature Granulocytes: 0 %
Lymphocytes Absolute: 1.2 10*3/uL (ref 0.7–3.1)
Lymphs: 32 %
MCH: 29.1 pg (ref 26.6–33.0)
MCHC: 33 g/dL (ref 31.5–35.7)
MCV: 88 fL (ref 79–97)
Monocytes Absolute: 0.4 10*3/uL (ref 0.1–0.9)
Monocytes: 11 %
Neutrophils Absolute: 2 10*3/uL (ref 1.4–7.0)
Neutrophils: 54 %
Platelets: 152 10*3/uL (ref 150–450)
RBC: 5.19 x10E6/uL (ref 4.14–5.80)
RDW: 12.4 % (ref 11.6–15.4)
WBC: 3.7 10*3/uL (ref 3.4–10.8)

## 2018-09-10 LAB — HEMOGLOBIN A1C
Est. average glucose Bld gHb Est-mCnc: 192 mg/dL
Hgb A1c MFr Bld: 8.3 % — ABNORMAL HIGH (ref 4.8–5.6)

## 2018-09-10 LAB — MICROALBUMIN / CREATININE URINE RATIO
Creatinine, Urine: 75.5 mg/dL
Microalb/Creat Ratio: 4886 mg/g creat — ABNORMAL HIGH (ref 0–29)
Microalbumin, Urine: 3688.7 ug/mL

## 2018-09-10 NOTE — Patient Instructions (Signed)
Radicular Pain Radicular pain is a type of pain that spreads from your back or neck along a spinal nerve. Spinal nerves are nerves that leave the spinal cord and go to the muscles. Radicular pain is sometimes called radiculopathy, radiculitis, or a pinched nerve. When you have this type of pain, you may also have weakness, numbness, or tingling in the area of your body that is supplied by the nerve. The pain may feel sharp and burning. Depending on which spinal nerve is affected, the pain may occur in the:  Neck area (cervical radicular pain). You may also feel pain, numbness, weakness, or tingling in the arms.  Mid-spine area (thoracic radicular pain). You would feel this pain in the back and chest. This type is rare.  Lower back area (lumbar radicular pain). You would feel this pain as low back pain. You may feel pain, numbness, weakness, or tingling in the buttocks or legs. Sciatica is a type of lumbar radicular pain that shoots down the back of the leg. Radicular pain occurs when one of the spinal nerves becomes irritated or squeezed (compressed). It is often caused by something pushing on a spinal nerve, such as one of the bones of the spine (vertebrae) or one of the round cushions between vertebrae (intervertebral disks). This can result from:  An injury.  Wear and tear or aging of a disk.  The growth of a bone spur that pushes on the nerve. Radicular pain often goes away when you follow instructions from your health care provider for relieving pain at home. Follow these instructions at home: Managing pain      If directed, put ice on the affected area: ? Put ice in a plastic bag. ? Place a towel between your skin and the bag. ? Leave the ice on for 20 minutes, 2-3 times a day.  If directed, apply heat to the affected area as often as told by your health care provider. Use the heat source that your health care provider recommends, such as a moist heat pack or a heating pad. ? Place  a towel between your skin and the heat source. ? Leave the heat on for 20-30 minutes. ? Remove the heat if your skin turns bright red. This is especially important if you are unable to feel pain, heat, or cold. You may have a greater risk of getting burned. Activity   Do not sit or rest in bed for long periods of time.  Try to stay as active as possible. Ask your health care provider what type of exercise or activity is best for you.  Avoid activities that make your pain worse, such as bending and lifting.  Do not lift anything that is heavier than 10 lb (4.5 kg), or the limit that you are told, until your health care provider says that it is safe.  Practice using proper technique when lifting items. Proper lifting technique involves bending your knees and rising up.  Do strength and range-of-motion exercises only as told by your health care provider or physical therapist. General instructions  Take over-the-counter and prescription medicines only as told by your health care provider.  Pay attention to any changes in your symptoms.  Keep all follow-up visits as told by your health care provider. This is important. ? Your health care provider may send you to a physical therapist to help with this pain. Contact a health care provider if:  Your pain and other symptoms get worse.  Your pain medicine is not   helping.  Your pain has not improved after a few weeks of home care.  You have a fever. Get help right away if:  You have severe pain, weakness, or numbness.  You have difficulty with bladder or bowel control. Summary  Radicular pain is a type of pain that spreads from your back or neck along a spinal nerve.  When you have radicular pain, you may also have weakness, numbness, or tingling in the area of your body that is supplied by the nerve.  The pain may feel sharp or burning.  Radicular pain may be treated with ice, heat, medicines, or physical therapy. This  information is not intended to replace advice given to you by your health care provider. Make sure you discuss any questions you have with your health care provider. Document Released: 05/22/2004 Document Revised: 10/27/2017 Document Reviewed: 10/27/2017 Elsevier Interactive Patient Education  2019 [employer]  

## 2018-09-13 ENCOUNTER — Telehealth: Payer: Self-pay

## 2018-09-13 NOTE — Telephone Encounter (Signed)
Patient has been advised. KW 

## 2018-09-13 NOTE — Telephone Encounter (Signed)
-----   Message from Trey Sailors, [place] sent at 09/13/2018  1:39 PM EDT ----- A1c up from last time at 8.3%. Will discuss more about controlling this at upcoming visit. Cholesterol a touch high, will discuss at visit. Some protein in urine which can be due to diabetes and which is already being treated by one of his blood pressure medications. Will recheck at next visit. CBC normal.

## 2018-09-14 ENCOUNTER — Encounter: Payer: Self-pay | Admitting: *Deleted

## 2018-09-15 ENCOUNTER — Ambulatory Visit: Payer: Self-pay | Admitting: *Deleted

## 2018-09-15 DIAGNOSIS — E119 Type 2 diabetes mellitus without complications: Secondary | ICD-10-CM

## 2018-09-15 NOTE — Chronic Care Management (AMB) (Addendum)
  Chronic Care Management     General Note  09/15/2018 Name: [name] Walker MRN: [id number] DOB: Sep 10, 1975  Late Entry [name] A Colquhoun is a 43 y.o. year old male who is a primary care patient of Bacigalupo, Dionne Bucy, MD. The CCM was consulted to assist the patient with Diabetes Education.   [name] was given information about Chronic Care Management services today including:  1. CCM service includes personalized support from designated clinical staff supervised by his physician, including individualized plan of care and coordination with other care providers 2. 24/7 contact phone numbers for assistance for urgent and routine care needs. 3. Service will only be billed when office clinical staff spend 20 minutes or more in a month to coordinate care. 4. Only one practitioner may furnish and bill the service in a calendar month. 5. The patient may stop CCM services at any time (effective at the end of the month) by phone call to the office staff. 6. The patient will be responsible for cost sharing (co-pay) of up to 20% of the service fee (after annual deductible is met).  Patient agreed to services and verbal consent obtained. on 09/14/2018  Review of patient status, including review of consultants reports, relevant laboratory and other test results, and collaboration with appropriate care team members and the patient's provider was performed as part of comprehensive patient evaluation and provision of chronic care management services.   Marland Kitchen SDOH (Social Determinants of Health) screening performed today. See Care Plan Entry related to challenges with: Physical Activity  Goals Addressed   None      Follow Up Plan: Appointment scheduled for RNCM to follow up with client by phone on: 09/21/2018 at 11:00am        Elliot Gurney, [place] Worker  [facility] [phone]

## 2018-09-15 NOTE — Patient Instructions (Signed)
.   Thank you allowing the Chronic Care Management Team to be a part of your care! It was a pleasure speaking with you today!  1. Please expect a phone call from the CCM RNCM Yvone Neu on 09/21/2018 at 11:00am     CCM (Chronic Care Management) Team   Yvone Neu RN, BSN Nurse Care Coordinator  [phone]  Karalee Height PharmD  Clinical Pharmacist  [phone]   Lucella Pommier [address] LCSW Clinical Social Worker [phone]  Goals Addressed   None      The patient verbalized understanding of instructions provided today and declined a print copy of patient instruction materials.   Telephone follow up appointment with CCM team member scheduled for:09/21/2018

## 2018-09-21 ENCOUNTER — Other Ambulatory Visit: Payer: Self-pay

## 2018-09-21 ENCOUNTER — Ambulatory Visit: Payer: Self-pay

## 2018-09-21 DIAGNOSIS — E119 Type 2 diabetes mellitus without complications: Secondary | ICD-10-CM

## 2018-09-21 NOTE — Chronic Care Management (AMB) (Signed)
  Care Management   Note  09/21/2018 Name: [name] Walker MRN: [id number] DOB: 1976-03-16  Care Coordination: Successful telephone encounter with Mr. [name] Walker, 43 year old male patient who sees Dr. Shirlee Latch for primary care. Dr. Beryle Flock referred [name] Walker to CCM Services for Diabetes Self Care education as patients recent A1C increased to 8.3. Mr. Such consented to services and was scheduled for his initial telephone assessment and eduction today at 11:00.  Unfortunately [name] Walker is not able to complete his appointment as he has returned to work. He is unsure when he will be available for an appointment and is requesting educational materials be sent to him via email. CCM RN CM provided patient with her contact information if patient should have any question about provided education or wishes to engage with RN CM in the future.   Plan: Patient provided educational material via email and encouraged to contact RN CM with questions or concerns.  [name] Walker E. [name] Portela, RN, BSN Nurse Care Coordinator [facility] [phone]

## 2018-09-21 NOTE — Patient Instructions (Signed)
Thank you allowing the Chronic Care Management Team to be a part of your care! It was a pleasure speaking with you today!  1. Please review the following educational materials and call me if you have any questions or additional needs. 2. You can have 45-60 grams of carbohydrates at each meal (3 times a day) and 15 grams for an evening snack. 3. Please look for additional email from [place] (Chronic Care Management) Team   Trish Fountain RN, BSN Nurse Care Coordinator  [phone]  Ruben Reason PharmD  Clinical Pharmacist  [phone]   [place], LCSW Clinical Social Worker [phone]   SYMPTOMS OF A STROKE   You have any symptoms of stroke. "BE FAST" is an easy way to remember the main warning signs: ? B - Balance. Signs are dizziness, sudden trouble walking, or loss of balance. ? E - Eyes. Signs are trouble seeing or a sudden change in how you see. ? F - Face. Signs are sudden weakness or loss of feeling of the face, or the face or eyelid drooping on one side. ? A - Arms. Signs are weakness or loss of feeling in an arm. This happens suddenly and usually on one side of the body. ? S - Speech. Signs are sudden trouble speaking, slurred speech, or trouble understanding what people say. ? T - Time. Time to call emergency services. Write down what time symptoms started.  You have other signs of stroke, such as: ? A sudden, very bad headache with no known cause. ? Feeling sick to your stomach (nausea). ? Throwing up (vomiting). ? Jerky movements you cannot control (seizure).  SYMPTOMS OF A HEART ATTACK  What are the signs or symptoms? Symptoms of this condition include:  Chest pain. It may feel like: ? Crushing or squeezing. ? Tightness, pressure, fullness, or heaviness.  Pain in the arm, neck, jaw, back, or upper body.  Shortness of breath.  Heartburn.  Indigestion.  Nausea.  Cold sweats.  Feeling tired.  Sudden lightheadedness.   Type 2  Diabetes Mellitus, Self Care, Adult When you have type 2 diabetes (type 2 diabetes mellitus), you must make sure your blood sugar (glucose) stays in a healthy range. You can do this with:  Nutrition.  Exercise.  Lifestyle changes.  Medicines or insulin, if needed.  Support from your doctors and others. How to stay aware of blood sugar   Check your blood sugar level every day, as often as told.  Have your A1c (hemoglobin A1c) level checked two or more times a year. Have it checked more often if your doctor tells you to. Your doctor will set personal treatment goals for you. Generally, you should have these blood sugar levels:  Before meals (preprandial): 80-130 mg/dL (4.4-7.2 mmol/L).  After meals (postprandial): below 180 mg/dL (10 mmol/L).  A1c level: less than 7%. How to manage high and low blood sugar Signs of high blood sugar High blood sugar is called hyperglycemia. Know the signs of high blood sugar. Signs may include:  Feeling: ? Thirsty. ? Hungry. ? Very tired.  Needing to pee (urinate) more than usual.  Blurry vision. Signs of low blood sugar Low blood sugar is called hypoglycemia. This is when blood sugar is at or below 70 mg/dL (3.9 mmol/L). Signs may include:  Feeling: ? Hungry. ? Worried or nervous (anxious). ? Sweaty and clammy. ? Confused. ? Dizzy. ? Sleepy. ? Sick to your stomach (nauseous).  Having: ? A fast heartbeat. ?  A headache. ? A change in your vision. ? Jerky movements that you cannot control (seizure). ? Tingling or no feeling (numbness) around your mouth, lips, or tongue.  Having trouble with: ? Moving (coordination). ? Sleeping. ? Passing out (fainting). ? Getting upset easily (irritability). Treating low blood sugar To treat low blood sugar, eat or drink something sugary right away. If you can think clearly and swallow safely, follow the 15:15 rule:  Take 15 grams of a fast-acting carb (carbohydrate). Talk with your doctor  about how much you should take.  Some fast-acting carbs are: ? Sugar tablets (glucose pills). Take 3-4 pills. ? 6-8 pieces of hard candy. ? 4-6 oz (120-150 mL) of fruit juice. ? 4-6 oz (120-150 mL) of regular (not diet) soda. ? 1 Tbsp (15 mL) honey or sugar.  Check your blood sugar 15 minutes after you take the carb.  If your blood sugar is still at or below 70 mg/dL (3.9 mmol/L), take 15 grams of a carb again.  If your blood sugar does not go above 70 mg/dL (3.9 mmol/L) after 3 tries, get help right away.  After your blood sugar goes back to normal, eat a meal or a snack within 1 hour. Treating very low blood sugar If your blood sugar is at or below 54 mg/dL (3 mmol/L), you have very low blood sugar (severe hypoglycemia). This is an emergency. Do not wait to see if the symptoms will go away. Get medical help right away. Call your local emergency services (911 in the [place] If you have very low blood sugar and you cannot eat or drink, you may need a glucagon shot (injection). A family member or friend should learn how to check your blood sugar and how to give you a glucagon shot. Ask your doctor if you need to have a glucagon shot kit at home. Follow these instructions at home: Medicine  Take insulin and diabetes medicines as told.  If your doctor says you should take more or less insulin and medicines, do this exactly as told.  Do not run out of insulin or medicines. Having diabetes can raise your risk for other long-term conditions. These include heart disease and kidney disease. Your doctor may prescribe medicines to help you not have these problems. Food   Make healthy food choices. These include: ? Chicken, fish, egg whites, and beans. ? Oats, whole wheat, bulgur, [place] rice, quinoa, and millet. ? Fresh fruits and vegetables. ? Low-fat dairy products. ? Nuts, avocado, olive oil, and canola oil.  Meet with a food specialist (dietitian). He or she can help you make an eating  plan that is right for you.  Follow instructions from your doctor about what you cannot eat or drink.  Drink enough fluid to keep your pee (urine) pale yellow.  Keep track of carbs that you eat. Do this by reading food labels and learning food serving sizes.  Follow your sick day plan when you cannot eat or drink normally. Make this plan with your doctor so it is ready to use. Activity  Exercise 3 or more times a week.  Do not go more than 2 days without exercising.  Talk with your doctor before you start a new exercise. Your doctor may need to tell you to change: ? How much insulin or medicines you take. ? How much food you eat. Lifestyle  Do not use any tobacco products. These include cigarettes, chewing tobacco, and e-cigarettes. If you need help quitting, ask your doctor.  Ask your doctor how much alcohol is safe for you.  Learn to deal with stress. If you need help with this, ask your doctor. Body care   Stay up to date with your shots (immunizations).  Have your eyes and feet checked by a doctor as often as told.  Check your skin and feet every day. Check for cuts, bruises, redness, blisters, or sores.  Brush your teeth and gums two times a day. Floss one or more times a day.  Go to the dentist one or more times every 6 months.  Stay at a healthy weight. General instructions  Take over-the-counter and prescription medicines only as told by your doctor.  Share your diabetes care plan with: ? Your work or school. ? People you live with.  Carry a card or wear jewelry that says you have diabetes.  Keep all follow-up visits as told by your doctor. This is important. Questions to ask your doctor  Do I need to meet with a diabetes educator?  Where can I find a support group for people with diabetes? Where to find more information To learn more about diabetes, visit:  American Diabetes Association: www.diabetes.org  American Association of Diabetes Educators:  www.diabeteseducator.org Summary  When you have type 2 diabetes, you must make sure your blood sugar (glucose) stays in a healthy range.  Check your blood sugar every day, as often as told.  Having diabetes can raise your risk for other conditions. Your doctor may prescribe medicines to help you not have these problems.  Keep all follow-up visits as told by your doctor. This is important. This information is not intended to replace advice given to you by your health care provider. Make sure you discuss any questions you have with your health care provider. Document Released: 08/06/2015 Document Revised: 10/05/2017 Document Reviewed: 05/18/2015 Elsevier Interactive Patient Education  2019 [employer].   Carbohydrate Counting for Diabetes Mellitus, Adult  Carbohydrate counting is a method of keeping track of how many carbohydrates you eat. Eating carbohydrates naturally increases the amount of sugar (glucose) in the blood. Counting how many carbohydrates you eat helps keep your blood glucose within normal limits, which helps you manage your diabetes (diabetes mellitus). It is important to know how many carbohydrates you can safely have in each meal. This is different for every person. A diet and nutrition specialist (registered dietitian) can help you make a meal plan and calculate how many carbohydrates you should have at each meal and snack. Carbohydrates are found in the following foods:  Grains, such as breads and cereals.  Dried beans and soy products.  Starchy vegetables, such as potatoes, peas, and corn.  Fruit and fruit juices.  Milk and yogurt.  Sweets and snack foods, such as cake, cookies, candy, chips, and soft drinks. How do I count carbohydrates? There are two ways to count carbohydrates in food. You can use either of the methods or a combination of both. Reading "Nutrition Facts" on packaged food The "Nutrition Facts" list is included on the labels of almost all  packaged foods and beverages in the [place] It includes:  The serving size.  Information about nutrients in each serving, including the grams (g) of carbohydrate per serving. To use the "Nutrition Facts":  Decide how many servings you will have.  Multiply the number of servings by the number of carbohydrates per serving.  The resulting number is the total amount of carbohydrates that you will be having. Learning standard serving sizes of other foods  When you eat carbohydrate foods that are not packaged or do not include "Nutrition Facts" on the label, you need to measure the servings in order to count the amount of carbohydrates:  Measure the foods that you will eat with a food scale or measuring cup, if needed.  Decide how many standard-size servings you will eat.  Multiply the number of servings by 15. Most carbohydrate-rich foods have about 15 g of carbohydrates per serving. ? For example, if you eat 8 oz (170 g) of strawberries, you will have eaten 2 servings and 30 g of carbohydrates (2 servings x 15 g = 30 g).  For foods that have more than one food mixed, such as soups and casseroles, you must count the carbohydrates in each food that is included. The following list contains standard serving sizes of common carbohydrate-rich foods. Each of these servings has about 15 g of carbohydrates:   hamburger bun or  English muffin.   oz (15 mL) syrup.   oz (14 g) jelly.  1 slice of bread.  1 six-inch tortilla.  3 oz (85 g) cooked rice or pasta.  4 oz (113 g) cooked dried beans.  4 oz (113 g) starchy vegetable, such as peas, corn, or potatoes.  4 oz (113 g) hot cereal.  4 oz (113 g) mashed potatoes or  of a large baked potato.  4 oz (113 g) canned or frozen fruit.  4 oz (120 mL) fruit juice.  4-6 crackers.  6 chicken nuggets.  6 oz (170 g) unsweetened dry cereal.  6 oz (170 g) plain fat-free yogurt or yogurt sweetened with artificial sweeteners.  8 oz (240 mL)  milk.  8 oz (170 g) fresh fruit or one small piece of fruit.  24 oz (680 g) popped popcorn. Example of carbohydrate counting Sample meal  3 oz (85 g) chicken breast.  6 oz (170 g) Mancini rice.  4 oz (113 g) corn.  8 oz (240 mL) milk.  8 oz (170 g) strawberries with sugar-free whipped topping. Carbohydrate calculation 1. Identify the foods that contain carbohydrates: ? Rice. ? Corn. ? Milk. ? Strawberries. 2. Calculate how many servings you have of each food: ? 2 servings rice. ? 1 serving corn. ? 1 serving milk. ? 1 serving strawberries. 3. Multiply each number of servings by 15 g: ? 2 servings rice x 15 g = 30 g. ? 1 serving corn x 15 g = 15 g. ? 1 serving milk x 15 g = 15 g. ? 1 serving strawberries x 15 g = 15 g. 4. Add together all of the amounts to find the total grams of carbohydrates eaten: ? 30 g + 15 g + 15 g + 15 g = 75 g of carbohydrates total. Summary  Carbohydrate counting is a method of keeping track of how many carbohydrates you eat.  Eating carbohydrates naturally increases the amount of sugar (glucose) in the blood.  Counting how many carbohydrates you eat helps keep your blood glucose within normal limits, which helps you manage your diabetes.  A diet and nutrition specialist (registered dietitian) can help you make a meal plan and calculate how many carbohydrates you should have at each meal and snack. This information is not intended to replace advice given to you by your health care provider. Make sure you discuss any questions you have with your health care provider. Document Released: 04/14/2005 Document Revised: 10/22/2016 Document Reviewed: 09/26/2015 Elsevier Interactive Patient Education  2019 [employer].

## 2018-10-11 ENCOUNTER — Other Ambulatory Visit: Payer: Self-pay

## 2018-10-11 ENCOUNTER — Ambulatory Visit: Payer: BC Managed Care – PPO | Admitting: Physician Assistant

## 2018-10-11 ENCOUNTER — Encounter: Payer: Self-pay | Admitting: Physician Assistant

## 2018-10-11 VITALS — BP 140/80 | HR 104 | Temp 99.2°F | Wt 333.4 lb

## 2018-10-11 DIAGNOSIS — E782 Mixed hyperlipidemia: Secondary | ICD-10-CM | POA: Diagnosis not present

## 2018-10-11 DIAGNOSIS — N5089 Other specified disorders of the male genital organs: Secondary | ICD-10-CM

## 2018-10-11 DIAGNOSIS — Z8739 Personal history of other diseases of the musculoskeletal system and connective tissue: Secondary | ICD-10-CM

## 2018-10-11 DIAGNOSIS — F419 Anxiety disorder, unspecified: Secondary | ICD-10-CM

## 2018-10-11 DIAGNOSIS — E119 Type 2 diabetes mellitus without complications: Secondary | ICD-10-CM | POA: Diagnosis not present

## 2018-10-11 DIAGNOSIS — M5416 Radiculopathy, lumbar region: Secondary | ICD-10-CM

## 2018-10-11 DIAGNOSIS — I10 Essential (primary) hypertension: Secondary | ICD-10-CM | POA: Diagnosis not present

## 2018-10-11 DIAGNOSIS — E1121 Type 2 diabetes mellitus with diabetic nephropathy: Secondary | ICD-10-CM

## 2018-10-11 DIAGNOSIS — B356 Tinea cruris: Secondary | ICD-10-CM

## 2018-10-11 LAB — POCT UA - MICROALBUMIN: Microalbumin Ur, POC: 100 mg/L

## 2018-10-11 LAB — POCT GLYCOSYLATED HEMOGLOBIN (HGB A1C)
Est. average glucose Bld gHb Est-mCnc: 192
Hemoglobin A1C: 8.3 % — AB (ref 4.0–5.6)

## 2018-10-11 MED ORDER — BUSPIRONE HCL 15 MG PO TABS: ORAL_TABLET | ORAL | 5 refills | Status: DC

## 2018-10-11 MED ORDER — FUROSEMIDE 40 MG PO TABS: ORAL_TABLET | ORAL | 0 refills | Status: DC

## 2018-10-11 MED ORDER — MICONAZOLE NITRATE 2 % EX AERP: INHALATION_SPRAY | CUTANEOUS | 0 refills | Status: DC

## 2018-10-11 MED ORDER — TRULICITY 0.75 MG/0.5ML ~~LOC~~ SOAJ: 1.0000 "pen " | SUBCUTANEOUS | 2 refills | Status: DC

## 2018-10-11 MED ORDER — GABAPENTIN 600 MG PO TABS: 600.0000 mg | ORAL_TABLET | Freq: Three times a day (TID) | ORAL | 0 refills | Status: DC

## 2018-10-11 NOTE — Progress Notes (Signed)
Patient: [name] Walker Male    DOB: [birth year]   43 y.o.   MRN: [id number] Visit Date: 10/12/2018  Today's Provider: Trinna Post, PA-C   Chief Complaint  Patient presents with  . Hypertension  . Diabetes   Subjective:    HPI  Diabetes Mellitus Type II, Follow-up:   Lab Results  Component Value Date   HGBA1C 8.3 (A) 10/11/2018   HGBA1C 8.3 (H) 09/09/2018   HGBA1C 7.7 07/07/2017   Last seen for diabetes 1 months ago.  Management since then includes none. He reports good compliance with treatment. He has tried metformin in the past and had diarrhea. He had glipizide as well which he reports gave him negative reactions, though he cannot recall.  He is not having side effects.  Current symptoms include none and have been stable. He is not currently taking any diabetic medications.    Episodes of hypoglycemia? no   Current Insulin Regimen: none Most Recent Eye Exam: not UTD Weight trend: stable Prior visit with dietician: no Current diet: well balanced Current exercise: none  ------------------------------------------------------------------------   Hypertension, follow-up:  BP Readings from Last 3 Encounters:  10/11/18 140/80  09/09/18 (!) 170/117  06/14/18 (!) 172/113    He was last seen for hypertension 1 months ago.  BP at that visit was 170/117. Management since that visit includes adding losartan 50 mg daily in addition to HCTZ 25 mg daily and metorpolol XL 50 mg daily .He reports good compliance with treatment. He is not having side effects.  He is not exercising. He is not adherent to low salt diet.   He is experiencing none.  Patient denies chest pain.   Cardiovascular risk factors include hypertension and male gender.  Use of agents associated with hypertension: none.   Wt Readings from Last 3 Encounters:  10/11/18 (!) 333 lb 6.4 oz (151.2 kg)  09/09/18 (!) 330 lb 6.4 oz (149.9 kg)  06/14/18 (!) 329 lb (149.2 kg)   Lumbar  Radiculopathy: Reports he continues to have back pain, was started on gabapentin 300 mg TID last visit which is helping but he is still in pain.   Scrotal Swelling: Reports he has this problem intermittently with scrotal swelling and says he has gotten Lasix for it in the past.   Scrotal Rash: Reports dry flaky skin and itchy rash around genital region that comes and goes. No penile discharge or blisters.   Right foot pain: Patient reports pain on the top of his right foot that he believes is gout. Has gotten better somewhat today.  ------------------------------------------------------------------------      Allergies  Allergen Reactions  . Ace Inhibitors Cough  . Amlodipine     Pedal edema  . Metformin And Related     diarrhea  . Other      Current Outpatient Medications:  .  atorvastatin (LIPITOR) 80 MG tablet, Take 1 tablet (80 mg total) by mouth daily., Disp: 90 tablet, Rfl: 3 .  busPIRone (BUSPAR) 15 MG tablet, TAKE 1 TABLET BY MOUTH TWICE DAILY, Disp: 60 tablet, Rfl: 5 .  furosemide (LASIX) 40 MG tablet, TAKE 1 TABLET BY MOUTH ONCE DAILY AS NEEDED FOR  SCROTAL  SWELLING, Disp: 14 tablet, Rfl: 0 .  hydrochlorothiazide (HYDRODIURIL) 25 MG tablet, Take 1 tablet (25 mg total) by mouth daily., Disp: 90 tablet, Rfl: 3 .  ibuprofen (ADVIL,MOTRIN) 600 MG tablet, Take 600 mg by mouth every 6 (six) hours as needed., Disp: , Rfl:  .  losartan (COZAAR) 50 MG tablet, Take 1 tablet (50 mg total) by mouth daily., Disp: 90 tablet, Rfl: 0 .  meloxicam (MOBIC) 7.5 MG tablet, Take 1-2 tablets daily for pain. Take with a little food., Disp: 30 tablet, Rfl: 0 .  metoprolol succinate (TOPROL-XL) 50 MG 24 hr tablet, TAKE 1 TABLET BY MOUTH ONCE DAILY (TAKE  WITH  OR  IMMEDIATELY  FOLLOWING  A  MEAL), Disp: 90 tablet, Rfl: 1 .  NON FORMULARY, CPAP everynight, Disp: , Rfl:  .  Dulaglutide (TRULICITY) 0.75 MG/0.5ML SOPN, Inject 1 pen into the skin once a week., Disp: 2 mL, Rfl: 2 .  gabapentin  (NEURONTIN) 600 MG tablet, Take 1 tablet (600 mg total) by mouth 3 (three) times daily., Disp: 270 tablet, Rfl: 0 .  Miconazole Nitrate 2 % AERP, Spray to affected area once daily., Disp: 85 g, Rfl: 0  Review of Systems  Constitutional: Negative.   Respiratory: Negative.   Genitourinary: Negative.   Neurological: Negative.     Social History   Tobacco Use  . Smoking status: Current Some Day Smoker    Types: Cigarettes  . Smokeless tobacco: Never Used  Substance Use Topics  . Alcohol use: Yes      Objective:   BP 140/80 (BP Location: Right Arm, Patient Position: Sitting, Cuff Size: Large)   Pulse (!) 104   Temp 99.2 F (37.3 C) (Oral)   Wt (!) 333 lb 6.4 oz (151.2 kg)   SpO2 95%   BMI 50.69 kg/m  Vitals:   10/11/18 1347  BP: 140/80  Pulse: (!) 104  Temp: 99.2 F (37.3 C)  TempSrc: Oral  SpO2: 95%  Weight: (!) 333 lb 6.4 oz (151.2 kg)     Physical Exam Constitutional:      Appearance: Normal appearance. He is obese.  Cardiovascular:     Rate and Rhythm: Regular rhythm. Tachycardia present.     Heart sounds: Normal heart sounds.  Pulmonary:     Effort: Pulmonary effort is normal.     Breath sounds: Normal breath sounds.  Genitourinary:   Musculoskeletal:     Right lower leg: Edema present.     Left lower leg: Edema present.       Feet:  Skin:    General: Skin is warm and dry.  Neurological:     Mental Status: He is alert.  Psychiatric:        Mood and Affect: Mood normal.        Behavior: Behavior normal.         Assessment & Plan    1. Diabetes mellitus with diabetic nephropathy (HCC)  Uncontrolled and has been for years. A1c today is 8.3%. Proteinuria on urine last visit confirmed on today's visit - explained diabetic nephropathy and diabetes's effect on all of the organ systems. Explained it is critical to control his diabetes, the most important part of which will be losing weight. Has has been referred to [facility] for diabetic educations, has not  reached back out for calls.   We will start him on trulicity today. Denies history of MEN or pancreatitis.   He will need a statin, eye exam and pneumovax.   - POCT glycosylated hemoglobin (Hb A1C) - Dulaglutide (TRULICITY) 0.75 MG/0.5ML SOPN; Inject 1 pen into the skin once a week.  Dispense: 2 mL; Refill: 2 - POCT UA - Microalbumin  2. Mixed hyperlipidemia  Will need statin.  3. Essential (primary) hypertension  Now controlled. Continue amlodipine 5 mg daily,  HCTZ 25 mg daily, losartan 50 mg daily.   4. Acute anxiety  - busPIRone (BUSPAR) 15 MG tablet; TAKE 1 TABLET BY MOUTH TWICE DAILY  Dispense: 60 tablet; Refill: 5  5. Scrotal edema  - furosemide (LASIX) 40 MG tablet; TAKE 1 TABLET BY MOUTH ONCE DAILY AS NEEDED FOR  SCROTAL  SWELLING  Dispense: 14 tablet; Refill: 0  6. Morbid obesity (HCC)  Counseled that it is critical to lose weight. He has been referred to [facility] and needs to contact them and set up appointment. Will also start trulicity to assist with this.  7. Lumbar radiculopathy  - gabapentin (NEURONTIN) 600 MG tablet; Take 1 tablet (600 mg total) by mouth 3 (three) times daily.  Dispense: 270 tablet; Refill: 0  8. Jock itch  - Miconazole Nitrate 2 % AERP; Spray to affected area once daily.  Dispense: 85 g; Refill: 0  9. Diabetes mellitus with nephropathy (HCC)   10. History of gout  Counseled that diuretics will worsen gout. Patient does not wish to stop HCTZ but we may have to consider this if gout flares increase. Explained I do not think he is having a gout flare today.       Trey SailorsAdriana M Elward Nocera, PA-C  [facility]

## 2018-10-11 NOTE — Patient Instructions (Signed)
CALL PORTIA We increased your gabapentin We started trulicity for your diabetes   Diabetes Mellitus and Exercise Exercising regularly is important for your overall health, especially when you have diabetes (diabetes mellitus). Exercising is not only about losing weight. It has many other health benefits, such as increasing muscle strength and bone density and reducing body fat and stress. This leads to improved fitness, flexibility, and endurance, all of which result in better overall health. Exercise has additional benefits for people with diabetes, including:  Reducing appetite.  Helping to lower and control blood glucose.  Lowering blood pressure.  Helping to control amounts of fatty substances (lipids) in the blood, such as cholesterol and triglycerides.  Helping the body to respond better to insulin (improving insulin sensitivity).  Reducing how much insulin the body needs.  Decreasing the risk for heart disease by: ? Lowering cholesterol and triglyceride levels. ? Increasing the levels of good cholesterol. ? Lowering blood glucose levels. What is my activity plan? Your health care provider or certified diabetes educator can help you make a plan for the type and frequency of exercise (activity plan) that works for you. Make sure that you:  Do at least 150 minutes of moderate-intensity or vigorous-intensity exercise each week. This could be brisk walking, biking, or water aerobics. ? Do stretching and strength exercises, such as yoga or weightlifting, at least 2 times a week. ? Spread out your activity over at least 3 days of the week.  Get some form of physical activity every day. ? Do not go more than 2 days in a row without some kind of physical activity. ? Avoid being inactive for more than 30 minutes at a time. Take frequent breaks to walk or stretch.  Choose a type of exercise or activity that you enjoy, and set realistic goals.  Start slowly, and gradually increase  the intensity of your exercise over time. What do I need to know about managing my diabetes?   Check your blood glucose before and after exercising. ? If your blood glucose is 240 mg/dL (13.3 mmol/L) or higher before you exercise, check your urine for ketones. If you have ketones in your urine, do not exercise until your blood glucose returns to normal. ? If your blood glucose is 100 mg/dL (5.6 mmol/L) or lower, eat a snack containing 15-20 grams of carbohydrate. Check your blood glucose 15 minutes after the snack to make sure that your level is above 100 mg/dL (5.6 mmol/L) before you start your exercise.  Know the symptoms of low blood glucose (hypoglycemia) and how to treat it. Your risk for hypoglycemia increases during and after exercise. Common symptoms of hypoglycemia can include: ? Hunger. ? Anxiety. ? Sweating and feeling clammy. ? Confusion. ? Dizziness or feeling light-headed. ? Increased heart rate or palpitations. ? Blurry vision. ? Tingling or numbness around the mouth, lips, or tongue. ? Tremors or shakes. ? Irritability.  Keep a rapid-acting carbohydrate snack available before, during, and after exercise to help prevent or treat hypoglycemia.  Avoid injecting insulin into areas of the body that are going to be exercised. For example, avoid injecting insulin into: ? The arms, when playing tennis. ? The legs, when jogging.  Keep records of your exercise habits. Doing this can help you and your health care provider adjust your diabetes management plan as needed. Write down: ? Food that you eat before and after you exercise. ? Blood glucose levels before and after you exercise. ? The type and amount of exercise  you have done. ? When your insulin is expected to peak, if you use insulin. Avoid exercising at times when your insulin is peaking.  When you start a new exercise or activity, work with your health care provider to make sure the activity is safe for you, and to  adjust your insulin, medicines, or food intake as needed.  Drink plenty of water while you exercise to prevent dehydration or heat stroke. Drink enough fluid to keep your urine clear or pale yellow. Summary  Exercising regularly is important for your overall health, especially when you have diabetes (diabetes mellitus).  Exercising has many health benefits, such as increasing muscle strength and bone density and reducing body fat and stress.  Your health care provider or certified diabetes educator can help you make a plan for the type and frequency of exercise (activity plan) that works for you.  When you start a new exercise or activity, work with your health care provider to make sure the activity is safe for you, and to adjust your insulin, medicines, or food intake as needed. This information is not intended to replace advice given to you by your health care provider. Make sure you discuss any questions you have with your health care provider. Document Released: 07/05/2003 Document Revised: 10/23/2016 Document Reviewed: 09/24/2015 Elsevier Interactive Patient Education  2019 [employer]

## 2018-10-25 ENCOUNTER — Emergency Department
Admission: EM | Admit: 2018-10-25 | Discharge: 2018-10-25 | Disposition: A | Discharge status: Home / Self Care | Payer: BC Managed Care – PPO | Attending: Emergency Medicine | Admitting: Emergency Medicine

## 2018-10-25 ENCOUNTER — Emergency Department: Payer: BC Managed Care – PPO

## 2018-10-25 ENCOUNTER — Other Ambulatory Visit: Payer: Self-pay

## 2018-10-25 DIAGNOSIS — E119 Type 2 diabetes mellitus without complications: Secondary | ICD-10-CM | POA: Insufficient documentation

## 2018-10-25 DIAGNOSIS — X58XXXS Exposure to other specified factors, sequela: Secondary | ICD-10-CM | POA: Diagnosis not present

## 2018-10-25 DIAGNOSIS — R2242 Localized swelling, mass and lump, left lower limb: Secondary | ICD-10-CM | POA: Diagnosis present

## 2018-10-25 DIAGNOSIS — Z79899 Other long term (current) drug therapy: Secondary | ICD-10-CM | POA: Insufficient documentation

## 2018-10-25 DIAGNOSIS — S32010S Wedge compression fracture of first lumbar vertebra, sequela: Secondary | ICD-10-CM | POA: Insufficient documentation

## 2018-10-25 DIAGNOSIS — F17210 Nicotine dependence, cigarettes, uncomplicated: Secondary | ICD-10-CM | POA: Insufficient documentation

## 2018-10-25 DIAGNOSIS — I10 Essential (primary) hypertension: Secondary | ICD-10-CM | POA: Diagnosis not present

## 2018-10-25 DIAGNOSIS — M7122 Synovial cyst of popliteal space [Baker], left knee: Secondary | ICD-10-CM | POA: Insufficient documentation

## 2018-10-25 HISTORY — DX: Sleep apnea, unspecified: G47.30

## 2018-10-25 MED ORDER — OXYCODONE-ACETAMINOPHEN 5-325 MG PO TABS
1.0000 | ORAL_TABLET | ORAL | Status: AC | PRN
  Administered 2018-10-25 (×2): 1 via ORAL
  Filled 2018-10-25 (×2): qty 1

## 2018-10-25 MED ORDER — OXYCODONE-ACETAMINOPHEN 5-325 MG PO TABS: 1.0000 | ORAL_TABLET | Freq: Three times a day (TID) | ORAL | 0 refills | Status: AC | PRN

## 2018-10-25 NOTE — ED Triage Notes (Signed)
Pt c/o left knee and hip pain for the past couple of days with swelling, denies injury, states he trips a lot at home.

## 2018-10-25 NOTE — Discharge Instructions (Addendum)
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  You will need to see Dr. Roland Rack with orthopedics for this.  The compression fracture you have in your back is old but I am unable to tell whether it has worsened since 2017.  Please follow-up with Dr. Cari Caraway with neurosurgery for this.

## 2018-10-25 NOTE — ED Notes (Signed)
See triage note  Presents with left knee pain  States hx of same in past with falls  States his pain became worse yesterday  States pain is mainly at knee but radiates into hip and lower leg

## 2018-10-25 NOTE — ED Provider Notes (Signed)
[facility] Emergency Department Provider Note  ____________________________________________  Time seen: Approximately 9:44 AM  I have reviewed the triage vital signs and the nursing notes.   HISTORY  Chief Complaint Leg Swelling    HPI [name] Walker is a 43 y.o. male that presents to the  emergency department for evaluation of left leg pain for 2 days.  Pain is primarily to his left knee but also feels a burning to the outside of his left hip.  He feels swelling to his left knee.  He occasionally falls because he leans over the sink to try to relieve pressure to his back and his knee will give out and he will fall.  He has not fallen recently.  He has asked his primary care provider in the past about sciatica but has been told that he did not think he had sciatica.  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 No back pain.  No fevers, shortness of breath, chest pain, nausea, vomiting, abdominal pain.   Past Medical History:  Diagnosis Date  . Hypertension   . Sleep apnea     Patient Active Problem List   Diagnosis Date Noted  . Morbid obesity (HCC) 09/10/2018  . History of gout 03/11/2018  . Hip pain 08/18/2016  . Lumbar radiculopathy 08/18/2016  . Chronic bilateral low back pain without sciatica 08/05/2016  . Acute anxiety 08/05/2016  . Essential (primary) hypertension 10/31/2015  . Diabetes mellitus with nephropathy (HCC) 10/31/2015  . HLD (hyperlipidemia) 10/31/2015  . OSA (obstructive sleep apnea) 10/31/2015  . Exomphalos 10/31/2015    Past Surgical History:  Procedure Laterality Date  . TONSILLECTOMY  1985    Prior to Admission medications   Medication Sig Start Date End Date Taking? Authorizing Provider  atorvastatin (LIPITOR) 80 MG tablet Take 1 tablet (80 mg total) by mouth daily. 02/18/18   Anola Gurneyhauvin, Robert, PA   busPIRone (BUSPAR) 15 MG tablet TAKE 1 TABLET BY MOUTH TWICE DAILY 10/11/18   Trey SailorsPollak, Adriana M, PA-C  Dulaglutide (TRULICITY) 0.75 MG/0.5ML SOPN Inject 1 pen into the skin once a week. 10/11/18 01/09/19  Trey SailorsPollak, Adriana M, PA-C  furosemide (LASIX) 40 MG tablet TAKE 1 TABLET BY MOUTH ONCE DAILY AS NEEDED FOR  SCROTAL  SWELLING 10/11/18   Trey SailorsPollak, Adriana M, PA-C  gabapentin (NEURONTIN) 600 MG tablet Take 1 tablet (600 mg total) by mouth 3 (three) times daily. 10/11/18 01/09/19  Trey SailorsPollak, Adriana M, PA-C  hydrochlorothiazide (HYDRODIURIL) 25 MG tablet Take 1 tablet (25 mg total) by mouth daily. 05/10/18   Anola Gurneyhauvin, Robert, PA  losartan (COZAAR) 50 MG tablet Take 1 tablet (50 mg total) by mouth daily. 09/09/18   Trey SailorsPollak, Adriana M, PA-C  metoprolol succinate (TOPROL-XL) 50 MG 24 hr tablet TAKE 1 TABLET BY MOUTH ONCE DAILY (TAKE  WITH  OR  IMMEDIATELY  FOLLOWING  A  MEAL) 06/03/18   Anola Gurneyhauvin, Robert, PA  Miconazole Nitrate 2 % AERP Spray to affected area once daily. 10/11/18   Trey SailorsPollak, Adriana M, PA-C  NON FORMULARY CPAP everynight    [provider]  oxyCODONE-acetaminophen (PERCOCET) 5-325 MG tablet Take 1 tablet by mouth every 8 (eight) hours as needed for up to 3 days. 10/25/18 10/28/18  Enid DerryWagner, Trentyn Boisclair, PA-C    Allergies Ace inhibitors, Amlodipine, Metformin and related, and Other  Family History  Problem  Relation Age of Onset  . Diabetes Mother   . Cancer Mother        lung    Social History Social History   Tobacco Use  . Smoking status: Current Some Day Smoker    Types: Cigarettes  . Smokeless tobacco: Never Used  Substance Use Topics  . Alcohol use: Yes  . Drug use: Never     Review of Systems  Constitutional: No fever/chills ENT: No upper respiratory complaints. Cardiovascular: No chest pain. Respiratory: No cough. No SOB. Gastrointestinal: No abdominal pain.  No nausea, no vomiting.  Musculoskeletal: Positive for knee pain. Skin: Negative for rash, abrasions, lacerations,  ecchymosis. Neurological: Negative for headaches.   ____________________________________________   PHYSICAL EXAM:  VITAL SIGNS: ED Triage Vitals  Enc Vitals Group     BP 10/25/18 0726 (!) 137/91     Pulse Rate 10/25/18 0726 (!) 54     Resp 10/25/18 0726 16     Temp 10/25/18 0726 98.4 F (36.9 C)     Temp Source 10/25/18 0726 Oral     SpO2 10/25/18 0726 (!) 89 %     Weight 10/25/18 0726 (!) 320 lb (145.2 kg)     Height 10/25/18 0726 5\' 9"  (1.753 m)     Head Circumference --      Peak Flow --      Pain Score 10/25/18 0729 9     Pain Loc --      Pain Edu? --      Excl. in GC? --      Constitutional: Alert and oriented. Well appearing and in no acute distress. Eyes: Conjunctivae are normal. PERRL. EOMI. Head: Atraumatic. ENT:      Ears:      Nose: No congestion/rhinnorhea.      Mouth/Throat: Mucous membranes are moist.  Neck: No stridor.  Cardiovascular: Normal rate, regular rhythm.  Good peripheral circulation. Respiratory: Normal respiratory effort without tachypnea or retractions. Lungs CTAB. Good air entry to the bases with no decreased or absent breath sounds. Gastrointestinal: Bowel sounds 4 quadrants. Soft and nontender to palpation. No guarding or rigidity. No palpable masses. No distention.  Musculoskeletal: Full range of motion to all extremities. No gross deformities appreciated.  No tenderness to palpation to lumbar spine.  Diffuse tenderness to palpation to left knee.  Positive straight leg raise. Neurologic:  Normal speech and language. No gross focal neurologic deficits are appreciated.  Skin:  Skin is warm, dry and intact. No rash noted. Psychiatric: Mood and affect are normal. Speech and behavior are normal. Patient exhibits appropriate insight and judgement.   ____________________________________________   LABS (all labs ordered are listed, but only abnormal results are displayed)  Labs Reviewed - No data to  display ____________________________________________  EKG  ST ____________________________________________  RADIOLOGY I, Enid DerryAshley Ely Spragg, personally viewed and evaluated these images (plain radiographs) as part of my medical decision making, as well as reviewing the written report by the radiologist.  Dg Lumbar Spine 2-3 Views  Result Date: 10/25/2018 CLINICAL DATA:  Leg pain, mid low back pain for 3-4 days EXAM: LUMBAR SPINE - 2-3 VIEW COMPARISON:  Chest x-ray 01/19/2014 FINDINGS: There are 5 nonrib bearing lumbar-type vertebral bodies. There is an age-indeterminate L1 vertebral body compression fracture with approximately 50% anterior height loss. The remainder the vertebral body heights are maintained. 1-2 mm retrolisthesis of L2 on L3 and L3 on L4. There is no spondylolysis. There is degenerative disease with disc height loss at L3-4, L4-5 and L5-S1. There  is bilateral facet arthropathy at L4-5 and L5-S1. The SI joints are unremarkable. IMPRESSION: 1. Age-indeterminate L1 vertebral body compression fracture with 50% anterior height loss. Electronically Signed   By: Kathreen Devoid   On: 10/25/2018 09:53   Ct Lumbar Spine Wo Contrast  Result Date: 10/25/2018 CLINICAL DATA:  Back pain without red flag symptoms. EXAM: CT LUMBAR SPINE WITHOUT CONTRAST TECHNIQUE: Multidetector CT imaging of the lumbar spine was performed without intravenous contrast administration. Multiplanar CT image reconstructions were also generated. COMPARISON:  Radiography from earlier today FINDINGS: Segmentation: 5 lumbar type vertebrae Alignment: Normal. Vertebrae: Degenerative appearing sclerosis in the L3-S1 vertebrae. Remote L1 compression fracture with mild anterior wedging. Bilateral sacroiliac irregularity and sclerosis. Paraspinal and other soft tissues: Arterial calcification. Disc levels: L1-L2: Facet spurring and endplate spurring.  No impingement L2-L3: Facet spurring and disc bulging with small endplate spurs. L3-L4:  Disc narrowing and bulging with asymmetric right-sided endplate sclerosis. There is epidural fat effacement with moderate thecal sac narrowing. Mild or moderate right foraminal narrowing L4-L5: Complete effacement of thecal sac by epidural fat. Asymmetric leftward disc narrowing and endplate degeneration with bulge. Patent foramina L5-S1:Disc narrowing and bulging that is mild. Posterior element hypertrophy. Thecal sac is completely effaced by epidural fat. Moderate left foraminal narrowing. IMPRESSION: 1. TheL1 compression fracture is remote.  No acute finding. 2. Epidural lipomatosis with thecal sac effacement from L3-4 to L5-S1. 3. Disc and facet degeneration as described. 4. 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 Electronically Signed   By: Monte Fantasia M.D.   On: 10/25/2018 11:57   US Venous Img Lower Unilateral Left  Result Date: 10/25/2018 CLINICAL DATA:  Pain EXAM: LEFT LOWER EXTREMITY VENOUS DOPPLER ULTRASOUND TECHNIQUE: Gray-scale sonography with compression, as well as color and duplex ultrasound, were performed to evaluate the deep venous system from the level of the common femoral vein through the popliteal and proximal calf veins. COMPARISON:  None FINDINGS: Normal compressibility of the common femoral, superficial femoral, and popliteal veins, as well as the proximal calf veins. No filling defects to suggest DVT on grayscale or color Doppler imaging. Doppler waveforms show normal direction of venous flow, normal respiratory phasicity and response to augmentation. 5 x 2.2 cm fluid collection in the posterior popliteal fossa. Left knee effusion is noted. Survey views of the contralateral common femoral vein are unremarkable. IMPRESSION: 1. No femoropopliteal and no calf DVT in the visualized calf veins. If clinical symptoms are inconsistent or if there are persistent or worsening symptoms, further imaging (possibly involving the iliac veins) may be warranted. 2. Left knee effusion with   Baker's cyst. Electronically Signed   By: Lucrezia Europe M.D.   On: 10/25/2018 10:33   Dg Chest Portable 1 View  Result Date: 10/25/2018 CLINICAL DATA:  Back pain after multiple falls. EXAM: PORTABLE CHEST 1 VIEW COMPARISON:  Radiographs of January 19, 2014. FINDINGS: Stable cardiomediastinal silhouette. No pneumothorax or pleural effusion is noted. Both lungs are clear. The visualized skeletal structures are unremarkable. IMPRESSION: No active disease. Electronically Signed   By: Marijo Conception M.D.   On: 10/25/2018 09:56   Dg Knee Complete 4 Views Left  Result Date: 10/25/2018 CLINICAL DATA:  Acute left knee pain after multiple falls. EXAM: LEFT KNEE - COMPLETE 4+ VIEW COMPARISON:  Radiographs of October 24, 2017. FINDINGS: No evidence of fracture, dislocation, or joint effusion. Minimal narrowing of medial joint space is noted. Soft tissues are unremarkable. IMPRESSION: Minimal degenerative joint disease is noted medially. No acute abnormality seen in  the left knee. Electronically Signed   By: Lupita RaiderJames  Green Jr M.D.   On: 10/25/2018 09:54    ____________________________________________    PROCEDURES  Procedure(s) performed:    Procedures    Medications  oxyCODONE-acetaminophen (PERCOCET/ROXICET) 5-325 MG per tablet 1 tablet (1 tablet Oral Given 10/25/18 1049)     ____________________________________________   INITIAL IMPRESSION / ASSESSMENT AND PLAN / ED COURSE  Pertinent labs & imaging results that were available during my care of the patient were reviewed by me and considered in my medical decision making (see chart for details).  Review of the [place] CSRS was performed in accordance of the [facility] prior to dispensing any controlled drugs.   Patient presents emergency department for acute on chronic knee pain.  Vital signs and exam are reassuring.  Ultrasound consistent with Baker's cyst, which is likely what is causing patient's pain.  Patient did have some outside hip burning so echo  was ordered to evaluate for radiculopathy.  X-ray consistent with compression fracture of L1 of 50%.  Patient suffered from compression fracture 3 years ago and has not followed up with anyone.  He denies any back pain.  CT was ordered to evaluate whether compression fracture has worsened since patient has had some falls.  X-ray consistent with remote compression fracture.  Pain improved with Percocet.  On arrival, oxygen was between 89 and 95%.  Previous office and ED visits revealed that this consistent with his baseline.  Chest x-ray negative for acute cardiopulmonary processes.  EKG shows sinus tachycardia with a heart rate of 103.  Patient will be discharged home with prescriptions for a short course of Percocet.  Patient is to follow up with orthopedics and neurosurgery as directed. Patient is given ED precautions to return to the ED for any worsening or new symptoms.  [name] Walker was evaluated in Emergency Department on 10/25/2018 for the symptoms described in the history of present illness. He was evaluated in the context of the global COVID-19 pandemic, which necessitated consideration that the patient might be at risk for infection with the SARS-CoV-2 virus that causes COVID-19. Institutional protocols and algorithms that pertain to the evaluation of patients at risk for COVID-19 are in a state of rapid change based on information released by regulatory bodies including the CDC and federal and state organizations. These policies and algorithms were followed during the patient's care in the ED.   ____________________________________________  FINAL CLINICAL IMPRESSION(S) / ED DIAGNOSES  Final diagnoses:  Compression fracture of L1 vertebra, sequela  Synovial cyst of left popliteal space      NEW MEDICATIONS STARTED DURING THIS VISIT:  ED Discharge Orders         Ordered    oxyCODONE-acetaminophen (PERCOCET) 5-325 MG tablet  Every 8 hours PRN     10/25/18 1301              This  chart was dictated using voice recognition software/Dragon. Despite best efforts to proofread, errors can occur which can change the meaning. Any change was purely unintentional.    Enid DerryWagner, Lanny Lipkin, PA-C 10/25/18 1554    Jene EveryKinner, Robert, MD 10/26/18 [phone]

## 2018-10-26 ENCOUNTER — Telehealth: Payer: Self-pay | Admitting: Physician Assistant

## 2018-10-26 ENCOUNTER — Ambulatory Visit: Payer: BC Managed Care – PPO | Admitting: Physician Assistant

## 2018-10-26 ENCOUNTER — Telehealth (INDEPENDENT_AMBULATORY_CARE_PROVIDER_SITE_OTHER): Payer: BC Managed Care – PPO | Admitting: Physician Assistant

## 2018-10-26 DIAGNOSIS — M7121 Synovial cyst of popliteal space [Baker], right knee: Secondary | ICD-10-CM

## 2018-10-26 DIAGNOSIS — M545 Low back pain, unspecified: Secondary | ICD-10-CM

## 2018-10-26 DIAGNOSIS — L03116 Cellulitis of left lower limb: Secondary | ICD-10-CM | POA: Insufficient documentation

## 2018-10-26 DIAGNOSIS — L02416 Cutaneous abscess of left lower limb: Secondary | ICD-10-CM | POA: Insufficient documentation

## 2018-10-26 MED ORDER — PIPERACILLIN-TAZOBACTAM 3.375 G IVPB
3.38 | INTRAVENOUS | Status: DC
Start: 2018-10-27 — End: 2018-10-26

## 2018-10-26 MED ORDER — INSULIN LISPRO 100 UNIT/ML ~~LOC~~ SOLN
0.00 | SUBCUTANEOUS | Status: DC
Start: 2018-11-04 — End: 2018-10-26

## 2018-10-26 MED ORDER — NICOTINE 14 MG/24HR TD PT24
1.00 | MEDICATED_PATCH | TRANSDERMAL | Status: DC
Start: 2018-11-05 — End: 2018-10-26

## 2018-10-26 MED ORDER — DEXTROSE 50 % IV SOLN
12.50 | INTRAVENOUS | Status: DC
Start: ? — End: 2018-10-26

## 2018-10-26 NOTE — Telephone Encounter (Signed)
Pt's wife called regarding her husband went to the ER yesterday .  He has a painful bakers cyst behind his knee.  He was given oxycodone but it is not helping  They made an appt for today at 11:00    [phone]  Thanks teri

## 2018-10-26 NOTE — Telephone Encounter (Signed)
Patient's wife called back concerning message from early. Ellison Hughs stated patient is unable to get up and walk. Patient is in extreme pain. Ellison Hughs would like advise on what to do. oxycodone prescribed is not helping with pain.Ellison Hughs states in order for pt to get up from where he has been laying since last night, they will have to call EMS. Please advise?

## 2018-10-26 NOTE — Telephone Encounter (Signed)
Spoke with patient's wife at 2:15 PM. Patient was sleeping and wife is listed on [place]. Patient was seen in the ER yesterday with Baker's cyst and had CT scan that showed remote compression fracture and also complete thecal effacement with lipomatosis. Patient's wife reports that he hasn't walked since yesterday due to extreme pain in his knee. He cannot even get up and go to the bathroom. She reports that his pain is uncontrolled on oxycodone. There was an appointment scheduled today at our clinic which patient did not attend. Wife reports she is unable to get patient up and she would need to call 911 transport to move him. Due to patient's imaging findings and constellation of symptoms, most importantly being unable to walk, I feel it would be best if patient were re-evaluated in the ER. I am unable to determine clinically over the phone if this is due to knee pain or spinal pathology. Wife is agreeable and will call EMS. We have called ahead to [facility] ER. I have spent 15 minutes total in the care of this patient.

## 2018-10-26 NOTE — Telephone Encounter (Signed)
Pt has been taken to [facility]. Pt did have fever in leg from knee to foot. Laqueta Linden wanted to let you know. Please advise  Thank you, [facility]

## 2018-10-27 MED ORDER — MORPHINE SULFATE 4 MG/ML IJ SOLN
4.00 | INTRAMUSCULAR | Status: DC
Start: ? — End: 2018-10-27

## 2018-10-27 MED ORDER — GENERIC EXTERNAL MEDICATION
2.00 | Status: DC
Start: ? — End: 2018-10-27

## 2018-10-27 MED ORDER — POLYETHYLENE GLYCOL 3350 17 G PO PACK
17.00 | PACK | ORAL | Status: DC
Start: ? — End: 2018-10-27

## 2018-10-27 MED ORDER — MELATONIN 3 MG PO TABS
6.00 | ORAL_TABLET | ORAL | Status: DC
Start: ? — End: 2018-10-27

## 2018-10-27 MED ORDER — GENERIC EXTERNAL MEDICATION
1250.00 | Status: DC
Start: 2018-10-27 — End: 2018-10-27

## 2018-10-27 MED ORDER — CEFEPIME HCL 2 GM/100ML IV SOLN
2.00 | INTRAVENOUS | Status: DC
Start: 2018-10-28 — End: 2018-10-27

## 2018-10-27 MED ORDER — SODIUM CHLORIDE 0.9 % IV SOLN
100.00 | INTRAVENOUS | Status: DC
Start: ? — End: 2018-10-27

## 2018-10-27 MED ORDER — GENERIC EXTERNAL MEDICATION
15.00 | Status: DC
Start: 2018-11-04 — End: 2018-10-27

## 2018-10-27 MED ORDER — ENOXAPARIN SODIUM 40 MG/0.4ML ~~LOC~~ SOLN
40.00 | SUBCUTANEOUS | Status: DC
Start: 2018-10-27 — End: 2018-10-27

## 2018-10-27 MED ORDER — OXYCODONE HCL 5 MG PO TABS
5.00 | ORAL_TABLET | ORAL | Status: DC
Start: ? — End: 2018-10-27

## 2018-10-27 MED ORDER — GABAPENTIN 300 MG PO CAPS
600.00 | ORAL_CAPSULE | ORAL | Status: DC
Start: 2018-10-28 — End: 2018-10-27

## 2018-10-27 MED ORDER — GUAIFENESIN 100 MG/5ML PO SYRP
200.00 | ORAL_SOLUTION | ORAL | Status: DC
Start: ? — End: 2018-10-27

## 2018-10-27 MED ORDER — GENERIC EXTERNAL MEDICATION
Status: DC
Start: ? — End: 2018-10-27

## 2018-10-27 MED ORDER — HYDROCHLOROTHIAZIDE 25 MG PO TABS
25.00 | ORAL_TABLET | ORAL | Status: DC
Start: 2018-10-29 — End: 2018-10-27

## 2018-10-27 MED ORDER — ATORVASTATIN CALCIUM 80 MG PO TABS
80.00 | ORAL_TABLET | ORAL | Status: DC
Start: 2018-11-05 — End: 2018-10-27

## 2018-10-27 MED ORDER — ALUM & MAG HYDROXIDE-SIMETH 400-400-40 MG/5ML PO SUSP
30.00 | ORAL | Status: DC
Start: ? — End: 2018-10-27

## 2018-10-27 MED ORDER — METOPROLOL SUCCINATE ER 50 MG PO TB24
50.00 | ORAL_TABLET | ORAL | Status: DC
Start: 2018-10-29 — End: 2018-10-27

## 2018-10-27 MED ORDER — ACETAMINOPHEN 325 MG PO TABS
650.00 | ORAL_TABLET | ORAL | Status: DC
Start: ? — End: 2018-10-27

## 2018-10-28 MED ORDER — GENERIC EXTERNAL MEDICATION
1250.00 | Status: DC
Start: 2018-10-28 — End: 2018-10-28

## 2018-10-28 MED ORDER — OXYCODONE HCL 5 MG PO TABS
10.00 | ORAL_TABLET | ORAL | Status: DC
Start: ? — End: 2018-10-28

## 2018-10-29 DIAGNOSIS — M6282 Rhabdomyolysis: Secondary | ICD-10-CM | POA: Insufficient documentation

## 2018-10-30 DIAGNOSIS — M009 Pyogenic arthritis, unspecified: Secondary | ICD-10-CM | POA: Insufficient documentation

## 2018-11-02 MED ORDER — GABAPENTIN 100 MG PO CAPS
100.00 | ORAL_CAPSULE | ORAL | Status: DC
Start: 2018-11-04 — End: 2018-11-02

## 2018-11-02 MED ORDER — CEFEPIME HCL 2 GM/100ML IV SOLN
2.00 | INTRAVENOUS | Status: DC
Start: 2018-11-04 — End: 2018-11-02

## 2018-11-02 MED ORDER — HEPARIN SODIUM (PORCINE) 10000 UNIT/ML IJ SOLN
7500.00 | INTRAMUSCULAR | Status: DC
Start: 2018-11-02 — End: 2018-11-02

## 2018-11-02 MED ORDER — ONDANSETRON HCL 4 MG/2ML IJ SOLN
4.00 | INTRAMUSCULAR | Status: DC
Start: ? — End: 2018-11-02

## 2018-11-02 MED ORDER — HYDROCHLOROTHIAZIDE 25 MG PO TABS
25.00 | ORAL_TABLET | ORAL | Status: DC
Start: 2018-11-03 — End: 2018-11-02

## 2018-11-02 MED ORDER — METOPROLOL SUCCINATE ER 50 MG PO TB24
50.00 | ORAL_TABLET | ORAL | Status: DC
Start: 2018-11-05 — End: 2018-11-02

## 2018-11-02 MED ORDER — VANCOMYCIN HCL IN NACL 1.5-0.9 GM/500ML-% IV SOLN
1500.00 | INTRAVENOUS | Status: DC
Start: 2018-11-02 — End: 2018-11-02

## 2018-11-04 MED ORDER — GENERIC EXTERNAL MEDICATION
6.00 | Status: DC
Start: 2018-11-05 — End: 2018-11-04

## 2018-11-04 MED ORDER — APIXABAN 5 MG PO TABS
10.00 | ORAL_TABLET | ORAL | Status: DC
Start: 2018-11-04 — End: 2018-11-04

## 2018-11-04 MED ORDER — GENERIC EXTERNAL MEDICATION
10.00 | Status: DC
Start: 2018-11-05 — End: 2018-11-04

## 2018-11-04 MED ORDER — APIXABAN 5 MG PO TABS
5.00 | ORAL_TABLET | ORAL | Status: DC
Start: ? — End: 2018-11-04

## 2018-11-04 MED ORDER — ACETAMINOPHEN 325 MG PO TABS
650.00 | ORAL_TABLET | ORAL | Status: DC
Start: ? — End: 2018-11-04

## 2018-11-05 MED ORDER — APIXABAN 5 MG PO TABS
5.00 | ORAL_TABLET | ORAL | Status: DC
Start: 2018-11-15 — End: 2018-11-05

## 2018-11-05 MED ORDER — METOPROLOL SUCCINATE ER 50 MG PO TB24
50.00 | ORAL_TABLET | ORAL | Status: DC
Start: 2018-11-16 — End: 2018-11-05

## 2018-11-05 MED ORDER — DEXTROSE 50 % IV SOLN
12.50 | INTRAVENOUS | Status: DC
Start: ? — End: 2018-11-05

## 2018-11-05 MED ORDER — INSULIN LISPRO 100 UNIT/ML ~~LOC~~ SOLN
0.00 | SUBCUTANEOUS | Status: DC
Start: 2018-11-15 — End: 2018-11-05

## 2018-11-05 MED ORDER — ALUM & MAG HYDROXIDE-SIMETH 400-400-40 MG/5ML PO SUSP
30.00 | ORAL | Status: DC
Start: ? — End: 2018-11-05

## 2018-11-05 MED ORDER — MELATONIN 3 MG PO TABS
6.00 | ORAL_TABLET | ORAL | Status: DC
Start: ? — End: 2018-11-05

## 2018-11-05 MED ORDER — APIXABAN 5 MG PO TABS
10.00 | ORAL_TABLET | ORAL | Status: DC
Start: 2018-11-05 — End: 2018-11-05

## 2018-11-05 MED ORDER — BISACODYL 10 MG RE SUPP
10.00 | RECTAL | Status: DC
Start: ? — End: 2018-11-05

## 2018-11-05 MED ORDER — BUSPIRONE HCL 15 MG PO TABS
15.00 | ORAL_TABLET | ORAL | Status: DC
Start: 2018-11-15 — End: 2018-11-05

## 2018-11-05 MED ORDER — CEFEPIME HCL 2 GM/100ML IV SOLN
2.00 | INTRAVENOUS | Status: DC
Start: 2018-11-15 — End: 2018-11-05

## 2018-11-05 MED ORDER — ACETAMINOPHEN 325 MG PO TABS
650.00 | ORAL_TABLET | ORAL | Status: DC
Start: ? — End: 2018-11-05

## 2018-11-05 MED ORDER — ATORVASTATIN CALCIUM 80 MG PO TABS
80.00 | ORAL_TABLET | ORAL | Status: DC
Start: 2018-11-15 — End: 2018-11-05

## 2018-11-05 MED ORDER — GENERIC EXTERNAL MEDICATION
2.00 | Status: DC
Start: ? — End: 2018-11-05

## 2018-11-05 MED ORDER — HYDRALAZINE HCL 25 MG PO TABS
25.00 | ORAL_TABLET | ORAL | Status: DC
Start: ? — End: 2018-11-05

## 2018-11-05 MED ORDER — GENERIC EXTERNAL MEDICATION
Status: DC
Start: ? — End: 2018-11-05

## 2018-11-05 MED ORDER — GABAPENTIN 100 MG PO CAPS
100.00 | ORAL_CAPSULE | ORAL | Status: DC
Start: 2018-11-15 — End: 2018-11-05

## 2018-11-05 MED ORDER — ONDANSETRON 4 MG PO TBDP
4.00 | ORAL_TABLET | ORAL | Status: DC
Start: ? — End: 2018-11-05

## 2018-11-05 MED ORDER — OXYCODONE HCL 5 MG PO TABS
5.00 | ORAL_TABLET | ORAL | Status: DC
Start: ? — End: 2018-11-05

## 2018-11-05 MED ORDER — GENERIC EXTERNAL MEDICATION
6.00 | Status: DC
Start: 2018-11-16 — End: 2018-11-05

## 2018-11-05 MED ORDER — POLYETHYLENE GLYCOL 3350 17 G PO PACK
17.00 | PACK | ORAL | Status: DC
Start: ? — End: 2018-11-05

## 2018-11-05 MED ORDER — GUAIFENESIN 100 MG/5ML PO SYRP
200.00 | ORAL_SOLUTION | ORAL | Status: DC
Start: ? — End: 2018-11-05

## 2018-11-05 MED ORDER — NICOTINE 14 MG/24HR TD PT24
1.00 | MEDICATED_PATCH | TRANSDERMAL | Status: DC
Start: 2018-11-16 — End: 2018-11-05

## 2018-11-05 MED ORDER — OXYCODONE HCL 5 MG PO TABS
10.00 | ORAL_TABLET | ORAL | Status: DC
Start: ? — End: 2018-11-05

## 2018-11-10 DIAGNOSIS — F17200 Nicotine dependence, unspecified, uncomplicated: Secondary | ICD-10-CM | POA: Insufficient documentation

## 2018-11-15 ENCOUNTER — Ambulatory Visit: Payer: Self-pay | Admitting: Physician Assistant

## 2018-11-15 ENCOUNTER — Telehealth: Payer: Self-pay

## 2018-11-15 MED ORDER — ALTEPLASE 2 MG IJ SOLR
2.00 | INTRAMUSCULAR | Status: DC
Start: ? — End: 2018-11-15

## 2018-11-15 MED ORDER — OXYCODONE HCL 5 MG PO TABS
5.00 | ORAL_TABLET | ORAL | Status: DC
Start: ? — End: 2018-11-15

## 2018-11-15 MED ORDER — HYDROXYZINE HCL 25 MG PO TABS
25.00 | ORAL_TABLET | ORAL | Status: DC
Start: ? — End: 2018-11-15

## 2018-11-15 MED ORDER — COLCHICINE 0.6 MG PO TABS
0.60 | ORAL_TABLET | ORAL | Status: DC
Start: 2018-11-16 — End: 2018-11-15

## 2018-11-15 MED ORDER — HYDROCHLOROTHIAZIDE 25 MG PO TABS
25.00 | ORAL_TABLET | ORAL | Status: DC
Start: 2018-11-16 — End: 2018-11-15

## 2018-11-15 MED ORDER — NICOTINE POLACRILEX 2 MG MT GUM
2.00 | CHEWING_GUM | OROMUCOSAL | Status: DC
Start: ? — End: 2018-11-15

## 2018-11-15 MED ORDER — OXYCODONE HCL 5 MG PO TABS
10.00 | ORAL_TABLET | ORAL | Status: DC
Start: ? — End: 2018-11-15

## 2018-11-15 NOTE — Telephone Encounter (Signed)
Spoke to patient regarding rescheduling appointment or changing to virtual visit. He will call back.

## 2018-11-18 ENCOUNTER — Encounter: Payer: Self-pay | Admitting: Physician Assistant

## 2018-11-18 ENCOUNTER — Telehealth: Payer: Self-pay

## 2018-11-18 DIAGNOSIS — M6282 Rhabdomyolysis: Secondary | ICD-10-CM

## 2018-11-18 DIAGNOSIS — N179 Acute kidney failure, unspecified: Secondary | ICD-10-CM

## 2018-11-18 DIAGNOSIS — M009 Pyogenic arthritis, unspecified: Secondary | ICD-10-CM

## 2018-11-18 NOTE — Telephone Encounter (Signed)
Patient is scheduled to have a virtual visit (hospital follow up) tomorrow at 1:20pm. Patient wants to know if he needs to have labs done prior to this visit?

## 2018-11-18 NOTE — Telephone Encounter (Signed)
Patient advised. Lab order printed and left up front.

## 2018-11-18 NOTE — Telephone Encounter (Signed)
I put labs in. He can come before his appointment tomorrow to get them, fasting not needed. Please print out labs and put it up front for him.

## 2018-11-18 NOTE — Progress Notes (Signed)
Patient: [name] Walker Male    DOB: 11/22/1975   43 y.o.   MRN: [id number] Visit Date: 11/18/2018  Today's Provider: Trey SailorsAdriana M Pollak, PA-C   Chief Complaint  Patient presents with  . Hospitalization Follow-up   Subjective:    Virtual Visit via Telephone Note  I connected with [name] A Bronkema on 11/18/18 at  1:20 PM EDT by telephone and verified that I am speaking with the correct person using two identifiers.  Location: Patient: Home Provider: Home   I discussed the limitations of evaluation and management by telemedicine and the availability of in person appointments. The patient expressed understanding and agreed to proceed.    HPI  Follow up Hospitalization  Patient [facility] on 10/26/2018 and discharged on 11/04/2018. He was transferred to Rehab and discharged on 11/15/2018 He was treated for Septic arthritis of his left knee.  He reports good compliance with treatment. He reports this condition is Improved.  He has a history of uncontrolled DM, HTN and sleep apnea. On 10/25/2018 [facility] with back and leg back. He had a CT scan of his lumbar spine and an ultrasound of his left knee that showed a Baker's cyst. Sent home. Pain worsened over the next day and he was advised to return to the ER. [facility]/2018 and was admitted for septic arthritis of his left knee. Left knee aspirated in the OR consistent with septic arthritis from bacterial source, though no causative organism ever grew on culture. His blood cultures were negative. His GC, chlamydia, and TB were negative. He was treated with IV antibiotics, which initially included vancomycin.   His hospital course was complicated by the following:  Rhabdomyolysis: CK peaked at 6600. This was managed with aggressive fluids. Unclear etiology. Last CK was 7.17.2020 and was 175. Patient was discontinued from his statin.  HTN emergency, Respiratory Failure, Pulmonary  embolism  During his hospitalization, he had an event overnight where he became disoriented, BP was extremely elevated, and oxygen sats dropped down to 70's. Rapid response was called and CXR showed fluid filled lungs and he was found to be in flash pulmonary edema. He was diuresed with Lasix with incomplete improvement of his oxygen saturations. CT angio chest revealed pulmonary embolism of right interlobar pulmonary atery and right lower lobe segmental and subsegmental branches. He was started on heparin drip and transitioned to ELiquis on 7.8.2020. He remains on 10 mg Eliquis BID x 7 days which was then switched to 5 mg eliquis twice daily on 11/10/2018. Recommended 3 months anticoagulation and repeat Chest CTA in 3 months.  AKI: Scr hovering around 1.6-1.7 during hospitalization. Thought to be due to vancomycin so this was changed to daptomycin.   HTN: Losartan held due to AKI. Restarted metoprolol 50 mg daily and HCTZ 12.5 mg which is decreased from 25 mg QD.  Type II DM: trulicity was held during hospitalization due to not having it on formulary and he was managed with SSI.  LVH: Echo from 10/29/2018 shows EF>55%, mild LVH and mild left atrial dilation  Today, patient reports he has been discharged from Rehab on 11/15/2018. He continues with his IV antibiotics through PICC line until 11/24/2018. He is receiving Daptomycin and cefepime. He is using supplemental O2 via nasal cannula and walking with rolling walker. Reports he has not had lipitor since hospitalization. He [facility]/24/2018 and  [facility] on 12/21/2018. Denies weakness, reports he is walking very well with the assistance of a walker. Interested in repeating sleep study due to weight change and recommendations [facility]. Had sleep study 2018 which revealed OSA and titrated CPAP to 16 cmH2O. Reports CPAP has mold in it currently.   ------------------------------------------------------------------------------------    Allergies  Allergen Reactions  . Ace Inhibitors Cough  . Amlodipine     Pedal edema  . Metformin And Related     diarrhea  . Other      Current Outpatient Medications:  .  atorvastatin (LIPITOR) 80 MG tablet, Take 1 tablet (80 mg total) by mouth daily., Disp: 90 tablet, Rfl: 3 .  Dulaglutide (TRULICITY) 0.75 MG/0.5ML SOPN, Inject 1 pen into the skin once a week., Disp: 2 mL, Rfl: 2 .  furosemide (LASIX) 40 MG tablet, TAKE 1 TABLET BY MOUTH ONCE DAILY AS NEEDED FOR  SCROTAL  SWELLING, Disp: 14 tablet, Rfl: 0 .  gabapentin (NEURONTIN) 600 MG tablet, Take 1 tablet (600 mg total) by mouth 3 (three) times daily., Disp: 270 tablet, Rfl: 0 .  hydrochlorothiazide (HYDRODIURIL) 25 MG tablet, Take 1 tablet (25 mg total) by mouth daily., Disp: 90 tablet, Rfl: 3 .  metoprolol succinate (TOPROL-XL) 50 MG 24 hr tablet, TAKE 1 TABLET BY MOUTH ONCE DAILY (TAKE  WITH  OR  IMMEDIATELY  FOLLOWING  A  MEAL), Disp: 90 tablet, Rfl: 1 .  NON FORMULARY, CPAP everynight, Disp: , Rfl:  .  busPIRone (BUSPAR) 15 MG tablet, TAKE 1 TABLET BY MOUTH TWICE DAILY (Patient not taking: Reported on 11/18/2018), Disp: 60 tablet, Rfl: 5 .  losartan (COZAAR) 50 MG tablet, Take 1 tablet (50 mg total) by mouth daily. (Patient not taking: Reported on 11/18/2018), Disp: 90 tablet, Rfl: 0  Review of Systems  Constitutional: Negative for appetite change, chills and fever.  Respiratory: Negative for chest tightness, shortness of breath and wheezing.   Cardiovascular: Negative for chest pain and palpitations.  Gastrointestinal: Negative for abdominal pain, nausea and vomiting.    Social History   Tobacco Use  . Smoking status: Former Smoker    Types: Cigarettes    Quit date: 2020    Years since quitting: 0.5  . Smokeless tobacco: Never Used  Substance Use Topics  . Alcohol use: Not Currently      Objective:   There were no vitals  taken for this visit. There were no vitals filed for this visit.   Physical Exam   No results found for any visits on 11/19/18.     Assessment & Plan    1. Hospital discharge follow-up  I have reviewed his hospitalization notes, labs, images and discharge summary. I have reconciled his medication list.   2. Pyogenic arthritis of left knee joint, due to unspecified organism (HCC)  No causative organism found. GC, chlamydia, TB all negative. Continues with IV daptomycin and cefepime through PICC line until 11/24/2018. Follow up appointment with [facility] on 11/24/2018.  3. Non-traumatic rhabdomyolysis  CK peaked at 6600. Last CK on 11/12/2018 was 175. He has not taken Lipitor since hospitalization. His most recent CK was 1300 on 11/18/2018. He is denying weakness and muscle pain. Discussed this case with Dr. Sherrie MustacheFisher. We will have him aggressively hydrate and repeat labs on 11/22/2018. He will continue to hold Lipitor. Will refer to nephrology, he has a particular nephrologist in mind but cannot remember the name and will call back with this.  - CK (  Creatine Kinase)  4. Diabetes mellitus with nephropathy [facility])  Patient reports he has made significant modifications to his diet and has eliminated sweetened drinks. He has stopped drinking and smoking. He would like to lose weight and discontinue his diabetes medications. Will restart trulicity.   - Comprehensive Metabolic Panel (CMET) - CBC with Differential  5. Diabetes mellitus without complication (HCC)  - Dulaglutide (TRULICITY) 8.76 OT/1.5BW SOPN; Inject 1 pen into the skin once a week.  Dispense: 2 mL; Refill: 2  6. Scrotal edema  I would like him to cut his Lasix in half to see if this helps with his AKI.   - furosemide (LASIX) 40 MG tablet; Take 0.5 tablets (20 mg total) by mouth daily as needed for edema. TAKE 1 TABLET BY MOUTH ONCE DAILY AS NEEDED FOR  SCROTAL  SWELLING  Dispense: 14 tablet; Refill: 0  7. AKI (acute kidney  injury) ([place])  Stable. Will refer to nephrology for further recommendations. I would also like their help managing his blood pressure, may have to consider amlodipine again.  8. Multiple subsegmental pulmonary emboli without acute cor pulmonale  Continues Eliquis 5 mg BID. Will need repeat CTA in 3 months.  9. LVH (left ventricular hypertrophy)  Counseled this is from uncontrolled HTN and we will need to work very hard to control this.  10. Sleep apnea  Reports it was recommended to him to repeat sleep study. He has gained weight since prior sleep study. Also reports he has mold in his CPAP machine. Advised him to call sleep company about the mold in his machine and I will place order for split night study.   The entirety of the information documented in the History of Present Illness, Review of Systems and Physical Exam were personally obtained by me. Portions of this information were initially documented by Meyer Cory, CMA and reviewed by me for thoroughness and accuracy.   F/u 1 month for chronic issues        Trinna Post, PA-C  [facility]

## 2018-11-19 ENCOUNTER — Ambulatory Visit (INDEPENDENT_AMBULATORY_CARE_PROVIDER_SITE_OTHER): Payer: BC Managed Care – PPO | Admitting: Physician Assistant

## 2018-11-19 ENCOUNTER — Other Ambulatory Visit: Payer: Self-pay

## 2018-11-19 ENCOUNTER — Telehealth: Payer: Self-pay | Admitting: Physician Assistant

## 2018-11-19 DIAGNOSIS — E1121 Type 2 diabetes mellitus with diabetic nephropathy: Secondary | ICD-10-CM

## 2018-11-19 DIAGNOSIS — M6282 Rhabdomyolysis: Secondary | ICD-10-CM

## 2018-11-19 DIAGNOSIS — Z09 Encounter for follow-up examination after completed treatment for conditions other than malignant neoplasm: Secondary | ICD-10-CM | POA: Diagnosis not present

## 2018-11-19 DIAGNOSIS — I2694 Multiple subsegmental pulmonary emboli without acute cor pulmonale: Secondary | ICD-10-CM

## 2018-11-19 DIAGNOSIS — M009 Pyogenic arthritis, unspecified: Secondary | ICD-10-CM | POA: Diagnosis not present

## 2018-11-19 DIAGNOSIS — E119 Type 2 diabetes mellitus without complications: Secondary | ICD-10-CM

## 2018-11-19 DIAGNOSIS — I517 Cardiomegaly: Secondary | ICD-10-CM

## 2018-11-19 DIAGNOSIS — N179 Acute kidney failure, unspecified: Secondary | ICD-10-CM

## 2018-11-19 DIAGNOSIS — I10 Essential (primary) hypertension: Secondary | ICD-10-CM

## 2018-11-19 DIAGNOSIS — G4730 Sleep apnea, unspecified: Secondary | ICD-10-CM

## 2018-11-19 DIAGNOSIS — N5089 Other specified disorders of the male genital organs: Secondary | ICD-10-CM

## 2018-11-19 LAB — CBC WITH DIFFERENTIAL/PLATELET
Basophils Absolute: 0 10*3/uL (ref 0.0–0.2)
Basos: 1 %
EOS (ABSOLUTE): 0.2 10*3/uL (ref 0.0–0.4)
Eos: 4 %
Hematocrit: 36.9 % — ABNORMAL LOW (ref 37.5–51.0)
Hemoglobin: 11.8 g/dL — ABNORMAL LOW (ref 13.0–17.7)
Immature Grans (Abs): 0 10*3/uL (ref 0.0–0.1)
Immature Granulocytes: 1 %
Lymphocytes Absolute: 1.4 10*3/uL (ref 0.7–3.1)
Lymphs: 24 %
MCH: 28 pg (ref 26.6–33.0)
MCHC: 32 g/dL (ref 31.5–35.7)
MCV: 87 fL (ref 79–97)
Monocytes Absolute: 0.7 10*3/uL (ref 0.1–0.9)
Monocytes: 12 %
Neutrophils Absolute: 3.4 10*3/uL (ref 1.4–7.0)
Neutrophils: 58 %
Platelets: 157 10*3/uL (ref 150–450)
RBC: 4.22 x10E6/uL (ref 4.14–5.80)
RDW: 13.4 % (ref 11.6–15.4)
WBC: 5.8 10*3/uL (ref 3.4–10.8)

## 2018-11-19 LAB — COMPREHENSIVE METABOLIC PANEL
ALT: 50 IU/L — ABNORMAL HIGH (ref 0–44)
AST: 79 IU/L — ABNORMAL HIGH (ref 0–40)
Albumin/Globulin Ratio: 0.9 — ABNORMAL LOW (ref 1.2–2.2)
Albumin: 3.9 g/dL — ABNORMAL LOW (ref 4.0–5.0)
Alkaline Phosphatase: 105 IU/L (ref 39–117)
BUN/Creatinine Ratio: 17 (ref 9–20)
BUN: 31 mg/dL — ABNORMAL HIGH (ref 6–24)
Bilirubin Total: 0.4 mg/dL (ref 0.0–1.2)
CO2: 29 mmol/L (ref 20–29)
Calcium: 9.7 mg/dL (ref 8.7–10.2)
Chloride: 91 mmol/L — ABNORMAL LOW (ref 96–106)
Creatinine, Ser: 1.81 mg/dL — ABNORMAL HIGH (ref 0.76–1.27)
GFR calc Af Amer: 53 mL/min/{1.73_m2} — ABNORMAL LOW (ref >59)
GFR calc non Af Amer: 45 mL/min/{1.73_m2} — ABNORMAL LOW (ref >59)
Globulin, Total: 4.3 g/dL (ref 1.5–4.5)
Glucose: 87 mg/dL (ref 65–99)
Potassium: 4.2 mmol/L (ref 3.5–5.2)
Sodium: 141 mmol/L (ref 134–144)
Total Protein: 8.2 g/dL (ref 6.0–8.5)

## 2018-11-19 LAB — CK: Total CK: 1381 U/L (ref 49–439)

## 2018-11-19 MED ORDER — TRULICITY 0.75 MG/0.5ML ~~LOC~~ SOAJ: 1.0000 "pen " | SUBCUTANEOUS | 2 refills | Status: DC

## 2018-11-19 MED ORDER — FUROSEMIDE 40 MG PO TABS: 20.0000 mg | ORAL_TABLET | Freq: Every day | ORAL | 0 refills | Status: DC | PRN

## 2018-11-19 NOTE — Telephone Encounter (Signed)
Patient discharged from [facility] rehab on 7/20. Prolonged hospitalization for left knee septic arthritis, subsequent rhabdomyolysis, HTN emergency w/ flash pulmonary edema, and pulmonary embolism. F/u CK was elevated on 7/233030, repeating labs on 11/22/2018. I will be out of the office at that point so I wanted to forward this just FYI.   Additionally, patient is calling back to specify nephrologist he would like to be referred to. Can we please place urgent referral for AKI, rhabdo and HTN to nephrologist of his choice.   Also, please schedule patient for 6 week follow up in this clinic.

## 2018-11-19 NOTE — Telephone Encounter (Signed)
Pt stated he was supposed to call back with the information of the nephrologist he would like the referral sent. Pt would like to see Dr. Juleen China with [facility]. Please advise. Thanks TNP

## 2018-11-19 NOTE — Patient Instructions (Signed)
Septic Arthritis Septic arthritis is inflammation of a joint that results from an infection. The infection occurs when bacteria or other germs get inside a joint. The knee and hip joints are most often affected, but other joints may also become infected. Usually, just one joint is affected. Joint infections need to be treated quickly to prevent damage to the joint, and to prevent the infection from spreading to other areas of your body. What are the causes? This condition is most often caused by Staphylococcus bacteria. Other causes may include:  Fungal infections.  Sexually transmitted infections (STIs).  Tuberculosis. Bacteria or other germs can spread to the joint. These bacteria are usually from:  Blood carrying germs from an infection in another part of your body to your joint. This is the most common cause of septic arthritis.  An open wound near the joint.  A needle put into the joint.  Joint surgery.  An infection in the bone (osteomyelitis) that spreads to the joint. What increases the risk? You may have a higher risk for this condition if you:  Have an artificial joint.  Have a blood or skin infection.  Have open sores or wounds on your skin.  Had a recent joint surgery or procedure.  Had a recent joint injury.  Have a long-term (chronic) disease, such as: ? Diabetes. ? Osteoarthritis. ? Rheumatoid arthritis. ? HIV (human immunodeficiency virus).  Have a condition or take medicines that weaken your body's defense system (immune system).  Use IV medicines.  Have gonorrhea.  Have a central line for IV access. What are the signs or symptoms? Symptoms of this condition include:  Swelling at the joint.  Severe pain in the joint.  Redness and warmth in the joint.  Being unable to move the joint.  Fever and chills. How is this diagnosed? This condition may be diagnosed based on:  Your symptoms.  Your medical history.  A physical exam.  Other  tests to confirm the diagnosis. These may include: ? Removing fluid from your joint to look for signs of infection (synovial fluid analysis). ? Blood tests.  Imaging studies. These may include: ? X-rays. ? MRI. ? CT scan. ? Ultrasound. How is this treated? This condition may be treated by:  Draining fluid from your joint. This may be done for several days in order to relieve pain.  Taking antibiotic medicine. This may be given by IV or by mouth. It may be done in a hospital at first. You may have to continue antibiotics at home by IV or by mouth for several weeks after that.  Surgery to remove: ? Infected fluid and tissue from the joint. ? An infected artificial joint. After the infection has started to heal, you may need physical therapy to regain strength and motion of the joint. Follow these instructions at home: Medicines   Take over-the-counter and prescription medicines only as told by your health care provider.  If you were prescribed an antibiotic medicine, take it as told by your health care provider. Do not stop taking the antibiotic even if you start to feel better.  Follow instructions from your health care provider about how to take antibiotics at home by IV. You may need to have a nurse come to your home to give you antibiotics through IV. Managing pain, stiffness, and swelling   If directed, put ice on the affected area: ? Put ice in a plastic bag. ? Place a towel between your skin and the bag. ? Leave the   ice on for 20 minutes, 2-3 times a day.  Raise (elevate) the affected area above the level of your heart while you are sitting or lying down. Activity  Return to your normal activities as told by your health care provider. Ask your health care provider what activities are safe for you.  Do any exercises or stretches as told by your health care provider or physical therapist. General instructions  Do not use any products that contain nicotine or tobacco,  such as cigarettes and e-cigarettes. These can delay healing. If you need help quitting, ask your health care provider.  Wash your hands often with soap and water. If soap and water are not available, use hand sanitizer.  Keep all follow-up visits as told by your health care provider. This is important. Contact a health care provider if you:  Have pain that is not controlled with medicine.  Develop a fever or chills.  Have redness, warmth, pain, or swelling that returns after treatment. Get help right away if you:  Have signs of worsening infection in your joint. Watch for: ? Very severe pain. ? Redness. ? Warmth. ? Swelling.  Have rapid breathing or you have trouble breathing.  Have chest pain.  Cannot drink fluids or make urine.  Notice that the affected area changed color or turned blue.  Have numbness or severe pain in the affected area. Summary  Septic arthritis is inflammation of a joint that occurs when bacteria or other germs get inside a joint and cause an infection.  Joint infections need to be treated quickly to prevent damage to the joint, and to prevent the infection from spreading to other areas of your body.  Symptoms of joint infection include redness, warmth, swelling, pain, and being unable to move a joint.  Treatment usually involves draining fluid from the joint and taking antibiotic medicine. This information is not intended to replace advice given to you by your health care provider. Make sure you discuss any questions you have with your health care provider. Document Released: 07/05/2002 Document Revised: 08/06/2018 Document Reviewed: 05/26/2017 Elsevier Patient Education  2020 Elsevier Inc.  

## 2018-11-19 NOTE — Telephone Encounter (Signed)
Patient needs a one month follow up scheduled. Referral placed.

## 2018-11-23 ENCOUNTER — Telehealth: Payer: Self-pay | Admitting: Physician Assistant

## 2018-11-23 ENCOUNTER — Telehealth: Payer: Self-pay

## 2018-11-23 DIAGNOSIS — M25561 Pain in right knee: Secondary | ICD-10-CM

## 2018-11-23 LAB — CBC WITH DIFFERENTIAL/PLATELET
Basophils Absolute: 0 10*3/uL (ref 0.0–0.2)
Basos: 1 %
EOS (ABSOLUTE): 0.2 10*3/uL (ref 0.0–0.4)
Eos: 3 %
Hematocrit: 34 % — ABNORMAL LOW (ref 37.5–51.0)
Hemoglobin: 11.4 g/dL — ABNORMAL LOW (ref 13.0–17.7)
Immature Grans (Abs): 0 10*3/uL (ref 0.0–0.1)
Immature Granulocytes: 1 %
Lymphocytes Absolute: 1.1 10*3/uL (ref 0.7–3.1)
Lymphs: 20 %
MCH: 28.7 pg (ref 26.6–33.0)
MCHC: 33.5 g/dL (ref 31.5–35.7)
MCV: 86 fL (ref 79–97)
Monocytes Absolute: 0.6 10*3/uL (ref 0.1–0.9)
Monocytes: 11 %
Neutrophils Absolute: 3.6 10*3/uL (ref 1.4–7.0)
Neutrophils: 64 %
Platelets: 150 10*3/uL (ref 150–450)
RBC: 3.97 x10E6/uL — ABNORMAL LOW (ref 4.14–5.80)
RDW: 13.6 % (ref 11.6–15.4)
WBC: 5.6 10*3/uL (ref 3.4–10.8)

## 2018-11-23 LAB — CK: Total CK: 1309 U/L (ref 49–439)

## 2018-11-23 LAB — COMPREHENSIVE METABOLIC PANEL
ALT: 60 IU/L — ABNORMAL HIGH (ref 0–44)
AST: 86 IU/L — ABNORMAL HIGH (ref 0–40)
Albumin/Globulin Ratio: 1.1 — ABNORMAL LOW (ref 1.2–2.2)
Albumin: 3.9 g/dL — ABNORMAL LOW (ref 4.0–5.0)
Alkaline Phosphatase: 104 IU/L (ref 39–117)
BUN/Creatinine Ratio: 18 (ref 9–20)
BUN: 30 mg/dL — ABNORMAL HIGH (ref 6–24)
Bilirubin Total: 0.4 mg/dL (ref 0.0–1.2)
CO2: 28 mmol/L (ref 20–29)
Calcium: 9.8 mg/dL (ref 8.7–10.2)
Chloride: 93 mmol/L — ABNORMAL LOW (ref 96–106)
Creatinine, Ser: 1.7 mg/dL — ABNORMAL HIGH (ref 0.76–1.27)
GFR calc Af Amer: 57 mL/min/{1.73_m2} — ABNORMAL LOW (ref >59)
GFR calc non Af Amer: 49 mL/min/{1.73_m2} — ABNORMAL LOW (ref >59)
Globulin, Total: 3.6 g/dL (ref 1.5–4.5)
Glucose: 98 mg/dL (ref 65–99)
Potassium: 4.3 mmol/L (ref 3.5–5.2)
Sodium: 140 mmol/L (ref 134–144)
Total Protein: 7.5 g/dL (ref 6.0–8.5)

## 2018-11-23 MED ORDER — OXYCODONE HCL 5 MG PO CAPS: 5.0000 mg | ORAL_CAPSULE | ORAL | 0 refills | Status: DC | PRN

## 2018-11-23 NOTE — Telephone Encounter (Signed)
Patient advised as directed below. 

## 2018-11-23 NOTE — Telephone Encounter (Signed)
Please advise 

## 2018-11-23 NOTE — Telephone Encounter (Signed)
Left message regarding prescription.

## 2018-11-23 NOTE — Telephone Encounter (Signed)
Pt calling back to check on the status of getting the oxycodone 5 mg filled.  Please call pt back to let him know on this asap.  Pt cannot drive himself and someone can drive him to pick it up.  Needing a time he can get a ride to drive him.  Thanks, [employer]

## 2018-11-23 NOTE — Telephone Encounter (Signed)
Sent in to [address]

## 2018-11-23 NOTE — Telephone Encounter (Signed)
Pt called asking if he could get a refill on the pain medication that was given to him in the hospital.  He was given oxycodone 5 mg taking it as needed for arthritis in his knee  [place]  PT's CB#  [phone]  [place]

## 2018-11-23 NOTE — Telephone Encounter (Signed)
-----   Message from Mar Daring, [place] sent at 11/23/2018  1:26 PM EDT ----- Kidney function slowly improving. Liver enzymes increased slightly so may need to watch these levels. Avoid tylenol (acetaminophen based products). Also avoid aleve, ibuprofen as well for kidney function. Keep pushing fluids. Blood count is stable. CK remains elevated but stable at 1309. Would recommend to continue to monitor these labs and recheck in 1-2 weeks if still asymptomatic.

## 2018-12-02 ENCOUNTER — Telehealth: Payer: Self-pay

## 2018-12-02 DIAGNOSIS — N179 Acute kidney failure, unspecified: Secondary | ICD-10-CM

## 2018-12-02 NOTE — Telephone Encounter (Signed)
Patient came in for labs today. 

## 2018-12-03 LAB — CBC WITH DIFFERENTIAL/PLATELET
Basophils Absolute: 0 10*3/uL (ref 0.0–0.2)
Basos: 0 %
EOS (ABSOLUTE): 0.1 10*3/uL (ref 0.0–0.4)
Eos: 3 %
Hematocrit: 34.6 % — ABNORMAL LOW (ref 37.5–51.0)
Hemoglobin: 11.3 g/dL — ABNORMAL LOW (ref 13.0–17.7)
Immature Grans (Abs): 0 10*3/uL (ref 0.0–0.1)
Immature Granulocytes: 0 %
Lymphocytes Absolute: 1.2 10*3/uL (ref 0.7–3.1)
Lymphs: 25 %
MCH: 27.2 pg (ref 26.6–33.0)
MCHC: 32.7 g/dL (ref 31.5–35.7)
MCV: 83 fL (ref 79–97)
Monocytes Absolute: 0.6 10*3/uL (ref 0.1–0.9)
Monocytes: 12 %
Neutrophils Absolute: 2.9 10*3/uL (ref 1.4–7.0)
Neutrophils: 60 %
RBC: 4.16 x10E6/uL (ref 4.14–5.80)
RDW: 13.2 % (ref 11.6–15.4)
WBC: 4.8 10*3/uL (ref 3.4–10.8)

## 2018-12-03 LAB — COMPREHENSIVE METABOLIC PANEL
ALT: 42 IU/L (ref 0–44)
AST: 43 IU/L — ABNORMAL HIGH (ref 0–40)
Albumin/Globulin Ratio: 1.1 — ABNORMAL LOW (ref 1.2–2.2)
Albumin: 4 g/dL (ref 4.0–5.0)
Alkaline Phosphatase: 102 IU/L (ref 39–117)
BUN/Creatinine Ratio: 18 (ref 9–20)
BUN: 34 mg/dL — ABNORMAL HIGH (ref 6–24)
Bilirubin Total: 0.5 mg/dL (ref 0.0–1.2)
CO2: 30 mmol/L — ABNORMAL HIGH (ref 20–29)
Calcium: 10.5 mg/dL — ABNORMAL HIGH (ref 8.7–10.2)
Chloride: 96 mmol/L (ref 96–106)
Creatinine, Ser: 1.94 mg/dL — ABNORMAL HIGH (ref 0.76–1.27)
GFR calc Af Amer: 48 mL/min/{1.73_m2} — ABNORMAL LOW (ref >59)
GFR calc non Af Amer: 42 mL/min/{1.73_m2} — ABNORMAL LOW (ref >59)
Globulin, Total: 3.8 g/dL (ref 1.5–4.5)
Glucose: 119 mg/dL — ABNORMAL HIGH (ref 65–99)
Potassium: 4.1 mmol/L (ref 3.5–5.2)
Sodium: 142 mmol/L (ref 134–144)
Total Protein: 7.8 g/dL (ref 6.0–8.5)

## 2018-12-03 LAB — CK: Total CK: 193 U/L (ref 49–439)

## 2018-12-06 ENCOUNTER — Ambulatory Visit: Payer: BC Managed Care – PPO | Admitting: Physician Assistant

## 2018-12-06 ENCOUNTER — Encounter: Payer: Self-pay | Admitting: Physician Assistant

## 2018-12-06 ENCOUNTER — Other Ambulatory Visit: Payer: Self-pay

## 2018-12-06 VITALS — BP 126/78 | HR 104 | Temp 99.0°F | Resp 16 | Ht 69.0 in | Wt 300.0 lb

## 2018-12-06 DIAGNOSIS — N179 Acute kidney failure, unspecified: Secondary | ICD-10-CM

## 2018-12-06 DIAGNOSIS — E119 Type 2 diabetes mellitus without complications: Secondary | ICD-10-CM | POA: Diagnosis not present

## 2018-12-06 MED ORDER — TRULICITY 1.5 MG/0.5ML ~~LOC~~ SOAJ: 1.5000 mg | SUBCUTANEOUS | 2 refills | Status: DC

## 2018-12-06 NOTE — Patient Instructions (Addendum)
Medical records: "I would like to request my medical records and notes from my [facility] admission, including my ER visit. 10/26/2018 - 11/16/2018."  DO not take losartan. Your kidneys are injured. Can continue metprolol and use HCTZ. Please keep appointment on 12/14/2018 with Dr. Candiss Norse.  Do not take ibuprofen, naproxen, motrin, aleve.  Can take tylenol 500 mg three times a day.   Diabetes Mellitus and Exercise Exercising regularly is important for your overall health, especially when you have diabetes (diabetes mellitus). Exercising is not only about losing weight. It has many other health benefits, such as increasing muscle strength and bone density and reducing body fat and stress. This leads to improved fitness, flexibility, and endurance, all of which result in better overall health. Exercise has additional benefits for people with diabetes, including:  Reducing appetite.  Helping to lower and control blood glucose.  Lowering blood pressure.  Helping to control amounts of fatty substances (lipids) in the blood, such as cholesterol and triglycerides.  Helping the body to respond better to insulin (improving insulin sensitivity).  Reducing how much insulin the body needs.  Decreasing the risk for heart disease by: ? Lowering cholesterol and triglyceride levels. ? Increasing the levels of good cholesterol. ? Lowering blood glucose levels. What is my activity plan? Your health care provider or certified diabetes educator can help you make a plan for the type and frequency of exercise (activity plan) that works for you. Make sure that you:  Do at least 150 minutes of moderate-intensity or vigorous-intensity exercise each week. This could be brisk walking, biking, or water aerobics. ? Do stretching and strength exercises, such as yoga or weightlifting, at least 2 times a week. ? Spread out your activity over at least 3 days of the week.  Get some form of physical activity every day. ? Do not  go more than 2 days in a row without some kind of physical activity. ? Avoid being inactive for more than 30 minutes at a time. Take frequent breaks to walk or stretch.  Choose a type of exercise or activity that you enjoy, and set realistic goals.  Start slowly, and gradually increase the intensity of your exercise over time. What do I need to know about managing my diabetes?   Check your blood glucose before and after exercising. ? If your blood glucose is 240 mg/dL (13.3 mmol/L) or higher before you exercise, check your urine for ketones. If you have ketones in your urine, do not exercise until your blood glucose returns to normal. ? If your blood glucose is 100 mg/dL (5.6 mmol/L) or lower, eat a snack containing 15-20 grams of carbohydrate. Check your blood glucose 15 minutes after the snack to make sure that your level is above 100 mg/dL (5.6 mmol/L) before you start your exercise.  Know the symptoms of low blood glucose (hypoglycemia) and how to treat it. Your risk for hypoglycemia increases during and after exercise. Common symptoms of hypoglycemia can include: ? Hunger. ? Anxiety. ? Sweating and feeling clammy. ? Confusion. ? Dizziness or feeling light-headed. ? Increased heart rate or palpitations. ? Blurry vision. ? Tingling or numbness around the mouth, lips, or tongue. ? Tremors or shakes. ? Irritability.  Keep a rapid-acting carbohydrate snack available before, during, and after exercise to help prevent or treat hypoglycemia.  Avoid injecting insulin into areas of the body that are going to be exercised. For example, avoid injecting insulin into: ? The arms, when playing tennis. ? The legs, when jogging.  Keep records of your exercise habits. Doing this can help you and your health care provider adjust your diabetes management plan as needed. Write down: ? Food that you eat before and after you exercise. ? Blood glucose levels before and after you exercise. ? The type  and amount of exercise you have done. ? When your insulin is expected to peak, if you use insulin. Avoid exercising at times when your insulin is peaking.  When you start a new exercise or activity, work with your health care provider to make sure the activity is safe for you, and to adjust your insulin, medicines, or food intake as needed.  Drink plenty of water while you exercise to prevent dehydration or heat stroke. Drink enough fluid to keep your urine clear or pale yellow. Summary  Exercising regularly is important for your overall health, especially when you have diabetes (diabetes mellitus).  Exercising has many health benefits, such as increasing muscle strength and bone density and reducing body fat and stress.  Your health care provider or certified diabetes educator can help you make a plan for the type and frequency of exercise (activity plan) that works for you.  When you start a new exercise or activity, work with your health care provider to make sure the activity is safe for you, and to adjust your insulin, medicines, or food intake as needed. This information is not intended to replace advice given to you by your health care provider. Make sure you discuss any questions you have with your health care provider. Document Released: 07/05/2003 Document Revised: 11/06/2016 Document Reviewed: 09/24/2015 Elsevier Patient Education  2020 [employer]

## 2018-12-06 NOTE — Progress Notes (Signed)
Patient: [name] Walker Male    DOB: [birth year]   43 y.o.   MRN: [id number] Visit Date: 12/06/2018  Today's Provider: Trinna Post, PA-C   Chief Complaint  Patient presents with  . Diabetes  . Hypertension   Subjective:    HPI  Diabetes Mellitus Type II, Follow-up:   Lab Results  Component Value Date   HGBA1C 8.3 (A) 10/11/2018   HGBA1C 8.3 (H) 09/09/2018   HGBA1C 7.7 07/07/2017    Last seen for diabetes 1 months ago.  Management since then includes starting back on Trulicity. He is currently taking 0.75 mg subQ once weekly.  He reports good compliance with treatment. He is not having side effects.  Current symptoms include none and have been stable. Home blood sugar records: fasting range: 118 this morning  Episodes of hypoglycemia? no   Most Recent Eye Exam: due Weight trend: stable Prior visit with dietician: No  Current exercise: no regular exercise, but he is staying active.  Current diet habits: in general, a "healthy" diet    Pertinent Labs:    Component Value Date/Time   CHOL 163 09/09/2018 1046   TRIG 101 09/09/2018 1046   HDL 44 09/09/2018 1046   LDLCALC 99 09/09/2018 1046   CREATININE 1.94 (H) 12/02/2018 1011   CREATININE 0.83 03/31/2013 1318   Morbid Obesity: He has lost weight since prior visit. Some of this is due to his recent hospitalization for septic arthritis. However, I do think some of it is also related to his weight loss efforts and Trulicity.   Wt Readings from Last 3 Encounters:  12/06/18 300 lb (136.1 kg)  10/25/18 (!) 320 lb (145.2 kg)  10/11/18 (!) 333 lb 6.4 oz (151.2 kg)    AKI, follow up: On last visit, patient was referred to nephrology for further evaluation. He was also advised to aggressively hydrate and repeat labs on 11/22/2018, and to continue to hold Lipitor. Labs indicated that kidney functions were slowly improving. Patient was also advised to cut Lasix dose in half. He was also advised to hold Losartan. He is  still taking Losartan for unclear reasons. His CK has improved.  [facility] Discharge Summary Listed Below:   "[name] Walker is a 43 year old male with a PMH significant for DM type 2, hypertension, OSA noncompliant with CPAP, and tobacco abuse who presented to [facility] on 10/26/2018 with concern for a septic arthritis of the left knee joint. Hospital course has been complicated by rhabdomyolysis, hypertensive emergency with flash pulmonary edema, and hypercarbic/hypoxic respiratory failure. [name] Walker has participated in acute inpatient physical and occupational therapies to improve functional mobility, activity tolerance, functional strength, balance, and endurance in order to facilitate safe performance of ADLs and daily routines. [name] Walker has been referred to [facility] for continued acute medical management, provision of intensive inpatient therapies, and patient/family training to facilitate safe performance of ADLs and mobility prior to discharge home.  Hospital Course:   Septic arthritis of the left knee: Patient presented with monoarticular arthritis of L knee, pain, swelling, warmth, decreased ROM suggestive of septic arthritis. CT LLE w/ contrast on 6/30 showed inflammatory stranding of the subcutaneous soft tissues about the knee with moderate joint effusion. Orthopedics performed a diagnostic aspiration of the knee of 7/1 showing WBC>70,000; 81%PMNs; ESR 87 mm/hr, indicative of septic arthritis s/p I&D of the left knee on 7/2 with aspirate on showing 2+ PMNs, but no organisms or growth. Bllood cultures drawn 7/7  had no growth over 5 days. GC, chlamydia, and quant TB gold plus were all negative. Daptomycin was initiated per ID for 4-week course from time of I&D (7/2-7/29).PICC line was placed on 7/7.He has been accepted for [facility] outpatient monitoring. CK trended down to 119 on 7/13, up to 175 on 7/17.   --> On 7/17 he was noted to be febrile to 38.2. Fever resolved withTylenol and he remained  asymptomatic, no localizing symptoms. WBC up to 8.6 on 7/17. Infectious disease provided recs for ESR, CRP and repeat blood cultures/UA/CXR/COVID test and repeat joint aspiration. CRP elevated to 154, but decreased from prior (296.5 on 7/2). ESR elevated to 71. L knee XR on 7/17 revealed moderate left knee effusion with stable findings from prior imaging. PVLs on 7/19 without evidence of DVT. Blood Cx no growth after 48 hours on 7/20. WBC downtrending prior to d/c (6.2 on 7/20). COVID negative. Orthopedics consulted on 7/17 for repeat joint aspiration and did not feel that clinical picture supported left knee septic joint as cause of fevers as patient had no pain with passive ROM, no TTP of left knee and no e/o drainage near incisions. Favor gout as etiology (see below). Given clinical stability, stable vitals, afebrile x 48 hours with WBC <11.0, pt deemed safe for discharge per primary team and ID attending attestation on 7/19 with close follow up with ID on 11/16/2018. Strict return precautions provided to patient.  -- WBAT LLE -- Pain well controlled with Gabapentin 100 mg TID, Oxycodone 5 mg Q6H PRN x 7 day course at discharge.  -- Daptomycin and Cefepime x 4 weeks thru 11/24/2018 -- He will follow up with ID as outpatient on 11/16/2018,accepted to [facility] -- Weekly CBC, CMP, and CK  R Ankle Pain: Likely to be gout given history of prior episodes. Favor gout as source of fever given improvement following 3 days of colchicine. Has remained afebrile and right ankle pain improved since starting colchicine.  -- Colchicine 1.54m loading (7/18), then 0.658mx3 (ended 7/20)  Pulmonary Embolus: He became SOB with increasing O2 requirement on 7/6 with notable desaturations and tachycardia. CTA Chestwas performed which showed a pulmonary embolism involving the right interlobar pulmonary artery and right lower lobe segmental and subsegmental branches with no evidence of right heart strain. He was started on a  heparin gtt, then transitioned to therapeutic eliquis on 7/8. He had a decreasing O2 requirement since that time. Now weaned off supplemental oxygen. -- Eliquis 106mor 7-day course (started 7/8) then switched to Eliquis 5mg80m BID (7/15) for 3 month course  -- Repeat chest CTA in 3 months to determine ongoing need for anticoagulation per PCP  Hypertensive Emergency (resolved)  OSA with hypercarbia:Hypertensive to 232/112 on acute side with notable O2 desaturations found to have flash pulmonary edema on CXR and significant hypercarbia (hx OSA). Patient transferred to MICU and managed on BiPAP and aggressive diuresis with improvement. Pt was refusing CPAP, but amenable when allowed to self administer. Intermittently wore CPAP for a few hours each night. Was hypercarbic >40, CO2 down to 37 with more consistent CPAP use. Held home losartan in setting of AKI. Restarted home dose HCTZ 25mg71m7/17 given elevated systolics in the mornings.  AKI: Baseline ~1.1. Urine studies with FENa of 2%, indicative of intrinsic renal injury. UPC elevated to 2.66. UA with 100mg/70mrotein. Renal US witKoreabilateral echogenicity, no hydronephrosis. Favor persistent intrinsic renal injury related to vancomycin (now on Dapto) vs. IV contrast vs. over-diuresis. Lasix 40  mg PO PRN at home for swelling. Cr persistently elevated, was stable at ~1.7, though bumped to 1.85 on 7/20 (potentially related to colchicine). May represent new baseline Cr. BUN wnl. Serum proteins wnl. Will schedule follow-up with [facility] nephrology as outpatient for management of underlying . Also hs follow-up scheduled with PCP (7/24).  -- Held Lasix beginning 7/15 as over-diuresis a potential etiology for AKI (was taking 33m PO qd started 7/12 for LE/scrotal edema) -- Nephrology recs (7/14): no further workup, maintain hydration, avoid nephrotoxic drugs, dose meds for Cr clearance.  -- Follow up with Nephrology as outpatient for further workup/monitoring,  referral sent on 7/20  Hypertension:  -- Metoprolol 50 mg daily -- Home HCTZ 25 mg daily -- Held home losartan while inpatient, restart per PCP or Nephrology  T2DM: A1c of 7.9%. Held home dulaglutide. -- Resume home dulaglutide -- Seen by Dietician for carb counting, Na restriction, healthy diet education   Mild LVH  Elevated troponin (improved): Secondary to hypertensive emergency and ventricular strain. Baseline pro-BNP 181 on admission, repeat on 7/10 was 181. Echo on 7/3 with EF>55%, mild LVH.  -- Atorvastatin 80 mg daily -- Home metoprolol and HCTZ as above -- Held home losartan -- Would benefit from further risk stratification in the future as outpatient "   CMP Latest Ref Rng & Units 12/02/2018 11/22/2018 11/18/2018  Glucose 65 - 99 mg/dL 119(H) 98 87  BUN 6 - 24 mg/dL 34(H) 30(H) 31(H)  Creatinine 0.76 - 1.27 mg/dL 1.94(H) 1.70(H) 1.81(H)  Sodium 134 - 144 mmol/L 142 140 141  Potassium 3.5 - 5.2 mmol/L 4.1 4.3 4.2  Chloride 96 - 106 mmol/L 96 93(L) 91(L)  CO2 20 - 29 mmol/L 30(H) 28 29  Calcium 8.7 - 10.2 mg/dL 10.5(H) 9.8 9.7  Total Protein 6.0 - 8.5 g/dL 7.8 7.5 8.2  Total Bilirubin 0.0 - 1.2 mg/dL 0.5 0.4 0.4  Alkaline Phos 39 - 117 IU/L 102 104 105  AST 0 - 40 IU/L 43(H) 86(H) 79(H)  ALT 0 - 44 IU/L 42 60(H) 50(H)   Lab Results  Component Value Date   CKTOTAL 193 12/02/2018   CKMB 1.5 03/01/2012   TROPONINI 0.04 03/01/2012      Allergies  Allergen Reactions  . Ace Inhibitors Cough  . Amlodipine     Pedal edema  . Metformin And Related     diarrhea  . Other      Current Outpatient Medications:  .  apixaban (ELIQUIS) 5 MG TABS tablet, Take 5 mg by mouth 2 (two) times daily., Disp: , Rfl:  .  atorvastatin (LIPITOR) 80 MG tablet, Take 1 tablet (80 mg total) by mouth daily., Disp: 90 tablet, Rfl: 3 .  Dulaglutide (TRULICITY) 04.17MEY/8.1KGSOPN, Inject 1 pen into the skin once a week., Disp: 2 mL, Rfl: 2 .  furosemide (LASIX) 40 MG tablet, Take 0.5  tablets (20 mg total) by mouth daily as needed for edema. TAKE 1 TABLET BY MOUTH ONCE DAILY AS NEEDED FOR  SCROTAL  SWELLING, Disp: 14 tablet, Rfl: 0 .  gabapentin (NEURONTIN) 600 MG tablet, Take 1 tablet (600 mg total) by mouth 3 (three) times daily., Disp: 270 tablet, Rfl: 0 .  hydrochlorothiazide (MICROZIDE) 12.5 MG capsule, Take 12.5 mg by mouth daily., Disp: , Rfl:  .  metoprolol succinate (TOPROL-XL) 50 MG 24 hr tablet, TAKE 1 TABLET BY MOUTH ONCE DAILY (TAKE  WITH  OR  IMMEDIATELY  FOLLOWING  A  MEAL), Disp: 90 tablet, Rfl: 1 .  oxycodone (OXY-IR) 5 MG capsule, Take 1 capsule (5 mg total) by mouth every 4 (four) hours as needed., Disp: 20 capsule, Rfl: 0 .  NON FORMULARY, CPAP everynight, Disp: , Rfl:   Review of Systems  Constitutional: Negative for activity change.  Respiratory: Negative for cough and shortness of breath.   Cardiovascular: Negative for chest pain, palpitations and leg swelling.  Musculoskeletal: Negative for arthralgias.  Neurological: Negative for dizziness and numbness.  Psychiatric/Behavioral: Negative for agitation, self-injury, sleep disturbance and suicidal ideas.      Social History   Tobacco Use  . Smoking status: Former Smoker    Types: Cigarettes    Quit date: 2020    Years since quitting: 0.6  . Smokeless tobacco: Never Used  Substance Use Topics  . Alcohol use: Not Currently      Objective:   BP 126/78 (Cuff Size: Large)   Pulse (!) 104   Temp 99 F (37.2 C)   Resp 16   Ht '5\' 9"'  (1.753 m)   Wt 300 lb (136.1 kg)   SpO2 99%   BMI 44.30 kg/m  Vitals:   12/06/18 1438  BP: 126/78  Pulse: (!) 104  Resp: 16  Temp: 99 F (37.2 C)  SpO2: 99%  Weight: 300 lb (136.1 kg)  Height: '5\' 9"'  (1.753 m)     Physical Exam Constitutional:      Appearance: Normal appearance.  Cardiovascular:     Rate and Rhythm: Normal rate and regular rhythm.     Heart sounds: Normal heart sounds.  Pulmonary:     Effort: Pulmonary effort is normal.      Breath sounds: Normal breath sounds.  Skin:    General: Skin is warm and dry.  Neurological:     Mental Status: He is alert and oriented to person, place, and time. Mental status is at baseline.  Psychiatric:        Mood and Affect: Mood normal.        Behavior: Behavior normal.      No results found for any visits on 12/06/18.     Assessment & Plan    1. Diabetes mellitus without complication ([place])  His A4Q today is 7.4% which is down from his previous a1c of 8.3%. Congratulated on this. He has made many positive lifestyle changes including reducing his calories, cutting out sugary drinks and processed foods, and quitting alcohol and smoking. We will increase trulicity to 1.5 mg subQ once weekly. I would like him to be farther out from his hospitalization before getting pneumonia vaccination.   - POCT glycosylated hemoglobin (Hb A1C) - Dulaglutide (TRULICITY) 1.5 PE/4.8LT SOPN; Inject 1.5 mg into the skin once a week.  Dispense: 2 mL; Refill: 2 - Comprehensive Metabolic Panel (CMET)  2. AKI (acute kidney injury) ([place])  [place] lab today. Reminded him to remain off losartan for now. He has upcoming appointment with nephrology.   - Comprehensive Metabolic Panel (CMET)  The entirety of the information documented in the History of Present Illness, Review of Systems and Physical Exam were personally obtained by me. Portions of this information were initially documented by Jennings Books CMA and reviewed by me for thoroughness and accuracy.         Trinna Post, PA-C  [facility]

## 2018-12-07 ENCOUNTER — Encounter: Payer: Self-pay | Admitting: Physician Assistant

## 2018-12-09 LAB — POCT GLYCOSYLATED HEMOGLOBIN (HGB A1C): Hemoglobin A1C: 7.4 % — AB (ref 4.0–5.6)

## 2018-12-14 ENCOUNTER — Encounter: Payer: Self-pay | Admitting: Physician Assistant

## 2018-12-14 NOTE — Telephone Encounter (Signed)
Please schedule visit for gout, in person or video (doxy please).

## 2018-12-15 ENCOUNTER — Encounter: Payer: Self-pay | Admitting: Physician Assistant

## 2018-12-15 ENCOUNTER — Telehealth: Payer: Self-pay | Admitting: Physician Assistant

## 2018-12-15 ENCOUNTER — Ambulatory Visit: Payer: BC Managed Care – PPO | Admitting: Physician Assistant

## 2018-12-15 ENCOUNTER — Other Ambulatory Visit: Payer: Self-pay

## 2018-12-15 VITALS — BP 137/98 | HR 104 | Temp 97.5°F | Resp 16 | Ht 69.0 in | Wt 298.0 lb

## 2018-12-15 DIAGNOSIS — I2699 Other pulmonary embolism without acute cor pulmonale: Secondary | ICD-10-CM

## 2018-12-15 DIAGNOSIS — E1121 Type 2 diabetes mellitus with diabetic nephropathy: Secondary | ICD-10-CM

## 2018-12-15 DIAGNOSIS — E782 Mixed hyperlipidemia: Secondary | ICD-10-CM

## 2018-12-15 DIAGNOSIS — M10271 Drug-induced gout, right ankle and foot: Secondary | ICD-10-CM

## 2018-12-15 DIAGNOSIS — I10 Essential (primary) hypertension: Secondary | ICD-10-CM

## 2018-12-15 DIAGNOSIS — M109 Gout, unspecified: Secondary | ICD-10-CM

## 2018-12-15 MED ORDER — COLCHICINE 0.6 MG PO TABS: ORAL_TABLET | ORAL | 0 refills | Status: DC

## 2018-12-15 MED ORDER — APIXABAN 5 MG PO TABS: 5.0000 mg | ORAL_TABLET | Freq: Two times a day (BID) | ORAL | 1 refills | Status: DC

## 2018-12-15 MED ORDER — LOSARTAN POTASSIUM 25 MG PO TABS: 25.0000 mg | ORAL_TABLET | Freq: Every day | ORAL | 0 refills | Status: DC

## 2018-12-15 NOTE — Progress Notes (Signed)
Patient: [name] Walker Male    DOB: 08/13/1975   43 y.o.   MRN: [id number] Visit Date: 12/15/2018  Today's Provider: Trinna Post, PA-C   Chief Complaint  Patient presents with  . Gout   Subjective:     HPI   Patient with history of DM II and AKI following recent is here concerning gout flare up in his right foot. Patient states gout flared up about 3 days ago. Patient has swelling and pain in right ankle. He does take Lasix PRN for swelling and HCTZ. The past week, he had steak for dinner three days in a row. He was previously treated for gout with colchicine at an urgent care.   Recently, patient has been to see his nephrologist Dr. Candiss Norse at [facility]. He was discontinued off his Losartan due to AKI. He is currently taking 50 mg metorpolol XL daily for his blood pressure. He was on HCTZ and lasix for BP and edema but this was changed by his nephrologist to torsemide due to hypercalcemia.   He has a history of pulmonary embolism when he was hospitalized. And he was started on eliquis 5 mg BID. He continues to take this without issue. He was recommended to have a repeat Chest CT scan in 3 months to ensure resolution of clot.   HTN: Currently taking metoprolol 50 mg XL.daily. Losartan has been discontinued due to AKI.   BP Readings from Last 3 Encounters:  12/15/18 (!) 137/98  12/06/18 126/78  10/25/18 128/90   CMP Latest Ref Rng & Units 12/02/2018 11/22/2018 11/18/2018  Glucose 65 - 99 mg/dL 119(H) 98 87  BUN 6 - 24 mg/dL 34(H) 30(H) 31(H)  Creatinine 0.76 - 1.27 mg/dL 1.94(H) 1.70(H) 1.81(H)  Sodium 134 - 144 mmol/L 142 140 141  Potassium 3.5 - 5.2 mmol/L 4.1 4.3 4.2  Chloride 96 - 106 mmol/L 96 93(L) 91(L)  CO2 20 - 29 mmol/L 30(H) 28 29  Calcium 8.7 - 10.2 mg/dL 10.5(H) 9.8 9.7  Total Protein 6.0 - 8.5 g/dL 7.8 7.5 8.2  Total Bilirubin 0.0 - 1.2 mg/dL 0.5 0.4 0.4  Alkaline Phos 39 - 117 IU/L 102 104 105  AST 0 - 40 IU/L 43(H) 86(H) 79(H)  ALT 0 - 44  IU/L 42 60(H) 50(H)   Allergies  Allergen Reactions  . Ace Inhibitors Cough  . Amlodipine     Pedal edema  . Metformin And Related     diarrhea  . Other    Wt Readings from Last 3 Encounters:  12/15/18 298 lb (135.2 kg)  12/06/18 300 lb (136.1 kg)  10/25/18 (!) 320 lb (145.2 kg)     Current Outpatient Medications:  .  apixaban (ELIQUIS) 5 MG TABS tablet, Take 5 mg by mouth 2 (two) times daily., Disp: , Rfl:  .  atorvastatin (LIPITOR) 80 MG tablet, Take 1 tablet (80 mg total) by mouth daily., Disp: 90 tablet, Rfl: 3 .  Dulaglutide (TRULICITY) 1.5 MW/4.1LK SOPN, Inject 1.5 mg into the skin once a week., Disp: 2 mL, Rfl: 2 .  gabapentin (NEURONTIN) 600 MG tablet, Take 1 tablet (600 mg total) by mouth 3 (three) times daily., Disp: 270 tablet, Rfl: 0 .  metoprolol succinate (TOPROL-XL) 50 MG 24 hr tablet, TAKE 1 TABLET BY MOUTH ONCE DAILY (TAKE  WITH  OR  IMMEDIATELY  FOLLOWING  A  MEAL), Disp: 90 tablet, Rfl: 1 .  NON FORMULARY, CPAP everynight, Disp: , Rfl:  .  furosemide (LASIX) 40 MG tablet, Take 0.5 tablets (20 mg total) by mouth daily as needed for edema. TAKE 1 TABLET BY MOUTH ONCE DAILY AS NEEDED FOR  SCROTAL  SWELLING (Patient not taking: Reported on 12/15/2018), Disp: 14 tablet, Rfl: 0 .  hydrochlorothiazide (MICROZIDE) 12.5 MG capsule, Take 12.5 mg by mouth daily., Disp: , Rfl:  .  oxycodone (OXY-IR) 5 MG capsule, Take 1 capsule (5 mg total) by mouth every 4 (four) hours as needed. (Patient not taking: Reported on 12/15/2018), Disp: 20 capsule, Rfl: 0  Review of Systems  Constitutional: Negative for appetite change, chills and fever.  Respiratory: Negative for chest tightness, shortness of breath and wheezing.   Cardiovascular: Negative for chest pain and palpitations.  Gastrointestinal: Negative for abdominal pain, nausea and vomiting.  Musculoskeletal: Positive for arthralgias and joint swelling.    Social History   Tobacco Use  . Smoking status: Former Smoker    Types:  Cigarettes    Quit date: 2020    Years since quitting: 0.6  . Smokeless tobacco: Never Used  Substance Use Topics  . Alcohol use: Not Currently      Objective:   BP (!) 137/98 (BP Location: Right Arm, Patient Position: Sitting, Cuff Size: Large)   Pulse (!) 104   Temp (!) 97.5 F (36.4 C) (Temporal)   Resp 16   Ht 5\' 9"  (1.753 m)   Wt 298 lb (135.2 kg)   SpO2 95%   BMI 44.01 kg/m  Vitals:   12/15/18 1015  BP: (!) 137/98  Pulse: (!) 104  Resp: 16  Temp: (!) 97.5 F (36.4 C)  TempSrc: Temporal  SpO2: 95%  Weight: 298 lb (135.2 kg)  Height: 5\' 9"  (1.753 m)     Physical Exam   No results found for any visits on 12/15/18.     Assessment & Plan    1. Pulmonary embolism, other, unspecified chronicity, unspecified whether acute cor pulmonale present (HCC)  Continue eliquis. Repeat CT scan three months from initial scan in 10/2018.   - apixaban (ELIQUIS) 5 MG TABS tablet; Take 1 tablet (5 mg total) by mouth 2 (two) times daily.  Dispense: 60 tablet; Refill: 1  2. Acute drug-induced gout of right ankle  Will treat as below. Recommend to discontinue fluid pills. Recommend to eliminate beer, red meat, and shellfish.   - colchicine 0.6 MG tablet; Take 1.2 mg or two tablets at first. Then one hour later, take 0.6 mg.  Dispense: 30 tablet; Refill: 0 - Uric acid  3. Mixed hyperlipidemia  Continue statin.   4. Essential (primary) hypertension  Have spoken to Dr. Thedore MinsSingh. Patient's blood pressure is elevated today and I have confirmed with his nephrologist that it is OK to restart losartan at 25 mg QD.   - Comprehensive Metabolic Panel (CMET) - CBC with Differential  5. Diabetes mellitus with nephropathy (HCC)  - Lipid Profile  6. Acute gout, unspecified cause, unspecified site  The entirety of the information documented in the History of Present Illness, Review of Systems and Physical Exam were personally obtained by me. Portions of this information were initially  documented by April M. Hyacinth MeekerMiller, CMA and reviewed by me for thoroughness and accuracy.   F/u 1 month chronic      Trey SailorsAdriana M Leasha Goldberger, PA-C  [facility]

## 2018-12-15 NOTE — Telephone Encounter (Signed)
Placed call to [facility] for Dr. Candiss Norse or his nurse regarding this patient's medications. Please let me know if/when they call.

## 2018-12-15 NOTE — Telephone Encounter (Signed)
LMOVM for Dr. Candiss Norse or Nurse to cb.

## 2018-12-15 NOTE — Telephone Encounter (Signed)
Patient was advised.  

## 2018-12-15 NOTE — Telephone Encounter (Signed)
Spoke with Dr. Candiss Norse and he stated it would be OK to restart Losartan at low dose for his BP. I have sent in Losartan 25 mg daily for patient to start taking again. Please let patient know.

## 2018-12-15 NOTE — Patient Instructions (Signed)
Gout  Gout is painful swelling of your joints. Gout is a type of arthritis. It is caused by having too much uric acid in your body. Uric acid is a chemical that is made when your body breaks down substances called purines. If your body has too much uric acid, sharp crystals can form and build up in your joints. This causes pain and swelling. Gout attacks can happen quickly and be very painful (acute gout). Over time, the attacks can affect more joints and happen more often (chronic gout). What are the causes?  Too much uric acid in your blood. This can happen because: ? Your kidneys do not remove enough uric acid from your blood. ? Your body makes too much uric acid. ? You eat too many foods that are high in purines. These foods include organ meats, some seafood, and beer.  Trauma or stress. What increases the risk?  Having a family history of gout.  Being male and middle-aged.  Being male and having gone through menopause.  Being very overweight (obese).  Drinking alcohol, especially beer.  Not having enough water in the body (being dehydrated).  Losing weight too quickly.  Having an organ transplant.  Having lead poisoning.  Taking certain medicines.  Having kidney disease.  Having a skin condition called psoriasis. What are the signs or symptoms? An attack of acute gout usually happens in just one joint. The most common place is the big toe. Attacks often start at night. Other joints that may be affected include joints of the feet, ankle, knee, fingers, wrist, or elbow. Symptoms of an attack may include:  Very bad pain.  Warmth.  Swelling.  Stiffness.  Shiny, red, or purple skin.  Tenderness. The affected joint may be very painful to touch.  Chills and fever. Chronic gout may cause symptoms more often. More joints may be involved. You may also have white or yellow lumps (tophi) on your hands or feet or in other areas near your joints. How is this  treated?  Treatment for this condition has two phases: treating an acute attack and preventing future attacks.  Acute gout treatment may include: ? NSAIDs. ? Steroids. These are taken by mouth or injected into a joint. ? Colchicine. This medicine relieves pain and swelling. It can be given by mouth or through an IV tube.  Preventive treatment may include: ? Taking small doses of NSAIDs or colchicine daily. ? Using a medicine that reduces uric acid levels in your blood. ? Making changes to your diet. You may need to see a food expert (dietitian) about what to eat and drink to prevent gout. Follow these instructions at home: During a gout attack   If told, put ice on the painful area: ? Put ice in a plastic bag. ? Place a towel between your skin and the bag. ? Leave the ice on for 20 minutes, 2-3 times a day.  Raise (elevate) the painful joint above the level of your heart as often as you can.  Rest the joint as much as possible. If the joint is in your leg, you may be given crutches.  Follow instructions from your doctor about what you cannot eat or drink. Avoiding future gout attacks  Eat a low-purine diet. Avoid foods and drinks such as: ? Liver. ? Kidney. ? Anchovies. ? Asparagus. ? Herring. ? Mushrooms. ? Mussels. ? Beer.  Stay at a healthy weight. If you want to lose weight, talk with your doctor. Do not lose weight   too fast.  Start or continue an exercise plan as told by your doctor. Eating and drinking  Drink enough fluids to keep your pee (urine) pale yellow.  If you drink alcohol: ? Limit how much you use to:  0-1 drink a day for women.  0-2 drinks a day for men. ? Be aware of how much alcohol is in your drink. In the [place] one drink equals one 12 oz bottle of beer (355 mL), one 5 oz glass of wine (148 mL), or one 1 oz glass of hard liquor (44 mL). General instructions  Take over-the-counter and prescription medicines only as told by your doctor.  Do  not drive or use heavy machinery while taking prescription pain medicine.  Return to your normal activities as told by your doctor. Ask your doctor what activities are safe for you.  Keep all follow-up visits as told by your doctor. This is important. Contact a doctor if:  You have another gout attack.  You still have symptoms of a gout attack after 10 days of treatment.  You have problems (side effects) because of your medicines.  You have chills or a fever.  You have burning pain when you pee (urinate).  You have pain in your lower back or belly. Get help right away if:  You have very bad pain.  Your pain cannot be controlled.  You cannot pee. Summary  Gout is painful swelling of the joints.  The most common site of pain is the big toe, but it can affect other joints.  Medicines and avoiding some foods can help to prevent and treat gout attacks. This information is not intended to replace advice given to you by your health care provider. Make sure you discuss any questions you have with your health care provider. Document Released: 01/22/2008 Document Revised: 11/04/2017 Document Reviewed: 11/04/2017 Elsevier Patient Education  2020 Elsevier Inc.  

## 2018-12-15 NOTE — Telephone Encounter (Signed)
Done

## 2018-12-16 ENCOUNTER — Encounter: Payer: Self-pay | Admitting: Physician Assistant

## 2018-12-16 LAB — COMPREHENSIVE METABOLIC PANEL
ALT: 25 IU/L (ref 0–44)
AST: 26 IU/L (ref 0–40)
Albumin/Globulin Ratio: 1.1 — ABNORMAL LOW (ref 1.2–2.2)
Albumin: 4.2 g/dL (ref 4.0–5.0)
Alkaline Phosphatase: 97 IU/L (ref 39–117)
BUN/Creatinine Ratio: 12 (ref 9–20)
BUN: 21 mg/dL (ref 6–24)
Bilirubin Total: 0.5 mg/dL (ref 0.0–1.2)
CO2: 27 mmol/L (ref 20–29)
Calcium: 10 mg/dL (ref 8.7–10.2)
Chloride: 97 mmol/L (ref 96–106)
Creatinine, Ser: 1.81 mg/dL — ABNORMAL HIGH (ref 0.76–1.27)
GFR calc Af Amer: 53 mL/min/{1.73_m2} — ABNORMAL LOW (ref >59)
GFR calc non Af Amer: 45 mL/min/{1.73_m2} — ABNORMAL LOW (ref >59)
Globulin, Total: 3.9 g/dL (ref 1.5–4.5)
Glucose: 93 mg/dL (ref 65–99)
Potassium: 3.7 mmol/L (ref 3.5–5.2)
Sodium: 140 mmol/L (ref 134–144)
Total Protein: 8.1 g/dL (ref 6.0–8.5)

## 2018-12-16 LAB — CBC WITH DIFFERENTIAL/PLATELET
Basophils Absolute: 0 10*3/uL (ref 0.0–0.2)
Basos: 0 %
EOS (ABSOLUTE): 0.2 10*3/uL (ref 0.0–0.4)
Eos: 4 %
Hematocrit: 34.8 % — ABNORMAL LOW (ref 37.5–51.0)
Hemoglobin: 11.6 g/dL — ABNORMAL LOW (ref 13.0–17.7)
Immature Grans (Abs): 0 10*3/uL (ref 0.0–0.1)
Immature Granulocytes: 0 %
Lymphocytes Absolute: 1.2 10*3/uL (ref 0.7–3.1)
Lymphs: 25 %
MCH: 27.4 pg (ref 26.6–33.0)
MCHC: 33.3 g/dL (ref 31.5–35.7)
MCV: 82 fL (ref 79–97)
Monocytes Absolute: 0.4 10*3/uL (ref 0.1–0.9)
Monocytes: 9 %
Neutrophils Absolute: 3 10*3/uL (ref 1.4–7.0)
Neutrophils: 62 %
Platelets: 133 10*3/uL — ABNORMAL LOW (ref 150–450)
RBC: 4.24 x10E6/uL (ref 4.14–5.80)
RDW: 13.3 % (ref 11.6–15.4)
WBC: 4.9 10*3/uL (ref 3.4–10.8)

## 2018-12-16 LAB — LIPID PANEL
Chol/HDL Ratio: 3.5 ratio (ref 0.0–5.0)
Cholesterol, Total: 107 mg/dL (ref 100–199)
HDL: 31 mg/dL — ABNORMAL LOW (ref >39)
LDL Calculated: 62 mg/dL (ref 0–99)
Triglycerides: 72 mg/dL (ref 0–149)
VLDL Cholesterol Cal: 14 mg/dL (ref 5–40)

## 2018-12-16 LAB — URIC ACID: Uric Acid: 6.9 mg/dL (ref 3.7–8.6)

## 2018-12-22 ENCOUNTER — Telehealth: Payer: Self-pay

## 2018-12-22 NOTE — Telephone Encounter (Signed)
Please call and ask his concerns.

## 2018-12-22 NOTE — Telephone Encounter (Signed)
Patient would like to talk to Fabio Bering about his recent lab results  CB # [phone]

## 2018-12-22 NOTE — Telephone Encounter (Signed)
Patient wanted to no if [name] Walker had heard anything from Dr. Maudie Mercury at [facility]. Patient has labs done there yesterday. Advised pt that we probably won't be able to see those for a couple of days. Please advise?

## 2018-12-22 NOTE — Telephone Encounter (Signed)
Please advise 

## 2018-12-23 NOTE — Telephone Encounter (Signed)
Yes agreed not available and [facility] ordering provider should review this.

## 2018-12-23 NOTE — Telephone Encounter (Signed)
Patient was advised.  

## 2018-12-24 ENCOUNTER — Telehealth: Payer: Self-pay

## 2018-12-24 NOTE — Telephone Encounter (Signed)
LMOVM to return call.

## 2018-12-24 NOTE — Telephone Encounter (Signed)
Please advise 

## 2018-12-24 NOTE — Telephone Encounter (Signed)
Patient called office to get in touch with Adriana about his lab results. Patient states that he has been trying to access [place] [facility] on Mychart but it does not show her listed as his PCP. Patient states that he received notice from his health insurance company that Fabio Bering is not a physician at [facility] and that he need to establish with a PCP at [facility] for services to be covered. Patient is asking for a call back when available.KW

## 2018-12-24 NOTE — Telephone Encounter (Signed)
Unsure. Maybe they only list physicians and not [place]. My supervising is Dr. Caryn Section so perhaps that will help.

## 2018-12-27 ENCOUNTER — Telehealth: Payer: Self-pay

## 2018-12-27 NOTE — Telephone Encounter (Signed)
He is unable to access you through [facility]. He would like you to contact him in regards to some questions.

## 2018-12-27 NOTE — Telephone Encounter (Signed)
Call and find out the questions please

## 2018-12-27 NOTE — Telephone Encounter (Signed)
Patient was advised and states that he will notify his insurance company.

## 2018-12-28 ENCOUNTER — Other Ambulatory Visit: Payer: Self-pay | Admitting: Physician Assistant

## 2018-12-28 MED ORDER — METOPROLOL SUCCINATE ER 50 MG PO TB24: ORAL_TABLET | ORAL | 1 refills | Status: DC

## 2018-12-28 NOTE — Telephone Encounter (Signed)
[facility] faxed refill request for the following medications: ° °metoprolol succinate (TOPROL-XL) 50 MG 24 hr tablet  ° ° °Please advise. °

## 2018-12-28 NOTE — Telephone Encounter (Signed)
lmtcb

## 2018-12-29 ENCOUNTER — Encounter: Payer: Self-pay | Admitting: Physician Assistant

## 2018-12-29 LAB — HM DIABETES EYE EXAM

## 2018-12-29 NOTE — Telephone Encounter (Signed)
Noted, patient messaged.

## 2018-12-29 NOTE — Telephone Encounter (Signed)
Patient reports just wanting to go over labs. Patient wanted to know if provider was looking at the same results. Patient was just concerned about out of range numbers. I explained to him that his labs did have some out of range results, but no drastic changes. I had him look at the past few dates to verify that his numbers were stable. Patient verbalizes understanding and is in agreement with plan.

## 2019-01-09 ENCOUNTER — Other Ambulatory Visit: Payer: Self-pay | Admitting: Physician Assistant

## 2019-01-09 DIAGNOSIS — M5416 Radiculopathy, lumbar region: Secondary | ICD-10-CM

## 2019-01-09 DIAGNOSIS — I10 Essential (primary) hypertension: Secondary | ICD-10-CM

## 2019-01-13 ENCOUNTER — Other Ambulatory Visit: Payer: Self-pay | Admitting: Physician Assistant

## 2019-01-13 DIAGNOSIS — I10 Essential (primary) hypertension: Secondary | ICD-10-CM

## 2019-01-13 DIAGNOSIS — N183 Chronic kidney disease, stage 3 unspecified: Secondary | ICD-10-CM

## 2019-01-17 ENCOUNTER — Ambulatory Visit: Payer: BC Managed Care – PPO | Admitting: Physician Assistant

## 2019-01-17 ENCOUNTER — Encounter: Payer: Self-pay | Admitting: Physician Assistant

## 2019-01-17 ENCOUNTER — Other Ambulatory Visit: Payer: Self-pay

## 2019-01-17 VITALS — BP 138/82 | HR 92 | Temp 97.1°F | Wt 311.0 lb

## 2019-01-17 DIAGNOSIS — Z23 Encounter for immunization: Secondary | ICD-10-CM

## 2019-01-17 DIAGNOSIS — I10 Essential (primary) hypertension: Secondary | ICD-10-CM | POA: Diagnosis not present

## 2019-01-17 DIAGNOSIS — E1121 Type 2 diabetes mellitus with diabetic nephropathy: Secondary | ICD-10-CM

## 2019-01-17 DIAGNOSIS — E782 Mixed hyperlipidemia: Secondary | ICD-10-CM

## 2019-01-17 NOTE — Progress Notes (Signed)
Patient: [name] Walker Male    DOB: [birth year]   43 y.o.   MRN: [id number] Visit Date: 01/17/2019  Today's Provider: Trey Sailors, PA-C   Chief Complaint  Patient presents with  . Hypertension   Subjective:     Diabetes He presents for his follow-up diabetic visit. He has type 2 diabetes mellitus. His disease course has been improving. His weight is increasing steadily. He is following a diabetic and generally healthy diet. He participates in exercise daily. His home blood glucose trend is increasing steadily (Fasting is around 115 in the mornnings.). An ACE inhibitor/angiotensin II receptor blocker is being taken. Eye exam is not current.  Hypertension    HTN: he has been started back on losartan 25 mg daily. He is doing well with this. He continues with metoprolol succinate 50 mg daily. He has been cautioned about diuretic use due to history of gout.   BP Readings from Last 3 Encounters:  01/17/19 138/82  12/15/18 (!) 137/98  12/06/18 126/78    HLD: Continues to tolerate Lipitor 80 mg daily. Rhabdomyolysis from hospitalization has resolved.   Lipid Panel     Component Value Date/Time   CHOL 107 12/15/2018 1048   TRIG 72 12/15/2018 1048   HDL 31 (L) 12/15/2018 1048   CHOLHDL 3.5 12/15/2018 1048   LDLCALC 62 12/15/2018 1048   Morbid Obesity: Patient has gained 13 lbs since last visit. He says is wife is pregnant and she doesn't want to eat if he is not eating as well.   Wt Readings from Last 3 Encounters:  01/17/19 (!) 311 lb (141.1 kg)  12/15/18 298 lb (135.2 kg)  12/06/18 300 lb (136.1 kg)      From 12/15/2018   Assessment   1. Pulmonary embolism, other, unspecified chronicity, unspecified whether acute cor pulmonale present (HCC)  Continue eliquis. Repeat CT scan three months from initial scan in 10/2018.   - apixaban (ELIQUIS) 5 MG TABS tablet; Take 1 tablet (5 mg total) by mouth 2 (two) times daily.  Dispense: 60 tablet; Refill: 1  2. Acute  drug-induced gout of right ankle  Will treat as below. Recommend to discontinue fluid pills. Recommend to eliminate beer, red meat, and shellfish.   - colchicine 0.6 MG tablet; Take 1.2 mg or two tablets at first. Then one hour later, take 0.6 mg.  Dispense: 30 tablet; Refill: 0 - Uric acid  3. Mixed hyperlipidemia  Continue statin.   4. Essential (primary) hypertension  Have spoken to Dr. Thedore Mins. Patient's blood pressure is elevated today and I have confirmed with his nephrologist that it is OK to restart losartan at 25 mg QD.   - Comprehensive Metabolic Panel (CMET) - CBC with Differential  5. Diabetes mellitus with nephropathy (HCC)  - Lipid Profile       Allergies  Allergen Reactions  . Ace Inhibitors Cough  . Amlodipine     Pedal edema  . Metformin And Related     diarrhea  . Other      Current Outpatient Medications:  .  apixaban (ELIQUIS) 5 MG TABS tablet, Take 1 tablet (5 mg total) by mouth 2 (two) times daily., Disp: 60 tablet, Rfl: 1 .  atorvastatin (LIPITOR) 80 MG tablet, Take 1 tablet (80 mg total) by mouth daily., Disp: 90 tablet, Rfl: 3 .  colchicine 0.6 MG tablet, Take 1.2 mg or two tablets at first. Then one hour later, take 0.6 mg., Disp: 30 tablet, Rfl:  0 .  Dulaglutide (TRULICITY) 1.5 MG/0.5ML SOPN, Inject 1.5 mg into the skin once a week., Disp: 2 mL, Rfl: 2 .  gabapentin (NEURONTIN) 600 MG tablet, TAKE 1 TABLET BY MOUTH THREE TIMES DAILY, Disp: 270 tablet, Rfl: 0 .  losartan (COZAAR) 25 MG tablet, Take 1 tablet (25 mg total) by mouth daily., Disp: 90 tablet, Rfl: 0 .  metoprolol succinate (TOPROL-XL) 50 MG 24 hr tablet, TAKE 1 TABLET BY MOUTH ONCE DAILY (TAKE  WITH  OR  IMMEDIATELY  FOLLOWING  A  MEAL), Disp: 90 tablet, Rfl: 1 .  NON FORMULARY, CPAP everynight, Disp: , Rfl:  .  torsemide (DEMADEX) 10 MG tablet, TAKE 1 TABLET BY MOUTH ONCE DAILY AS NEEDED FOR SWELLING. MAY TAKE ONE HALF TABLET INITIALLY, Disp: , Rfl:  .  oxycodone (OXY-IR)  5 MG capsule, Take 1 capsule (5 mg total) by mouth every 4 (four) hours as needed. (Patient not taking: Reported on 12/15/2018), Disp: 20 capsule, Rfl: 0  Review of Systems  Social History   Tobacco Use  . Smoking status: Former Smoker    Types: Cigarettes    Quit date: 2020    Years since quitting: 0.7  . Smokeless tobacco: Never Used  Substance Use Topics  . Alcohol use: Not Currently      Objective:   BP 138/82 (BP Location: Left Arm, Patient Position: Sitting, Cuff Size: Large)   Pulse 92   Temp (!) 97.1 F (36.2 C) (Temporal)   Wt (!) 311 lb (141.1 kg)   BMI 45.93 kg/m  Vitals:   01/17/19 1406  BP: 138/82  Pulse: 92  Temp: (!) 97.1 F (36.2 C)  TempSrc: Temporal  Weight: (!) 311 lb (141.1 kg)  Body mass index is 45.93 kg/m.   Physical Exam Constitutional:      Appearance: Normal appearance. He is obese.  Cardiovascular:     Rate and Rhythm: Normal rate and regular rhythm.     Heart sounds: Normal heart sounds.  Pulmonary:     Breath sounds: Normal breath sounds.  Skin:    General: Skin is warm and dry.  Neurological:     Mental Status: He is alert and oriented to person, place, and time. Mental status is at baseline.  Psychiatric:        Mood and Affect: Mood normal.        Behavior: Behavior normal.      No results found for any visits on 01/17/19.     Assessment & Plan    1. Diabetes mellitus with nephropathy (HCC)  Doing well on the 1.5 mg subQ once weekly trulicity.   - Comprehensive Metabolic Panel (CMET) - HgB A1c  2. Essential (primary) hypertension  Controlled, continue current medication.   - losartan (COZAAR) 25 MG tablet; Take 1 tablet (25 mg total) by mouth daily.  Dispense: 90 tablet; Refill: 0  3. Mixed hyperlipidemia  Continue Lipitor.  4. Morbid obesity (HCC)  Concerning that he gained 13 lbs in one month. Counseled that it is very important to continue losing weight and focus on his eating habits.   5. Need for  vaccination against Streptococcus pneumoniae - Pneumococcal polysaccharide vaccine 23-valent greater than or equal to 2yo subcutaneous/IM  6. Need for Tdap vaccination  - Tdap vaccine greater than or equal to 7yo IM  The entirety of the information documented in the History of Present Illness, Review of Systems and Physical Exam were personally obtained by me. Portions of this information were initially  documented by Ashley Royalty, CMA and reviewed by me for thoroughness and accuracy.   F/u 3 months     Trinna Post, PA-C  [facility]

## 2019-01-18 LAB — COMPREHENSIVE METABOLIC PANEL
ALT: 20 IU/L (ref 0–44)
AST: 19 IU/L (ref 0–40)
Albumin/Globulin Ratio: 1.4 (ref 1.2–2.2)
Albumin: 4.3 g/dL (ref 4.0–5.0)
Alkaline Phosphatase: 87 IU/L (ref 39–117)
BUN/Creatinine Ratio: 11 (ref 9–20)
BUN: 18 mg/dL (ref 6–24)
Bilirubin Total: 0.2 mg/dL (ref 0.0–1.2)
CO2: 27 mmol/L (ref 20–29)
Calcium: 9.4 mg/dL (ref 8.7–10.2)
Chloride: 101 mmol/L (ref 96–106)
Creatinine, Ser: 1.6 mg/dL — ABNORMAL HIGH (ref 0.76–1.27)
GFR calc Af Amer: 61 mL/min/{1.73_m2} (ref >59)
GFR calc non Af Amer: 53 mL/min/{1.73_m2} — ABNORMAL LOW (ref >59)
Globulin, Total: 3 g/dL (ref 1.5–4.5)
Glucose: 114 mg/dL — ABNORMAL HIGH (ref 65–99)
Potassium: 3.8 mmol/L (ref 3.5–5.2)
Sodium: 142 mmol/L (ref 134–144)
Total Protein: 7.3 g/dL (ref 6.0–8.5)

## 2019-01-18 LAB — HEMOGLOBIN A1C
Est. average glucose Bld gHb Est-mCnc: 140 mg/dL
Hgb A1c MFr Bld: 6.5 % — ABNORMAL HIGH (ref 4.8–5.6)

## 2019-01-18 MED ORDER — LOSARTAN POTASSIUM 25 MG PO TABS: 25.0000 mg | ORAL_TABLET | Freq: Every day | ORAL | 0 refills | Status: DC

## 2019-01-18 NOTE — Patient Instructions (Signed)
Diabetes Mellitus and Exercise Exercising regularly is important for your overall health, especially when you have diabetes (diabetes mellitus). Exercising is not only about losing weight. It has many other health benefits, such as increasing muscle strength and bone density and reducing body fat and stress. This leads to improved fitness, flexibility, and endurance, all of which result in better overall health. Exercise has additional benefits for people with diabetes, including:  Reducing appetite.  Helping to lower and control blood glucose.  Lowering blood pressure.  Helping to control amounts of fatty substances (lipids) in the blood, such as cholesterol and triglycerides.  Helping the body to respond better to insulin (improving insulin sensitivity).  Reducing how much insulin the body needs.  Decreasing the risk for heart disease by: ? Lowering cholesterol and triglyceride levels. ? Increasing the levels of good cholesterol. ? Lowering blood glucose levels. What is my activity plan? Your health care provider or certified diabetes educator can help you make a plan for the type and frequency of exercise (activity plan) that works for you. Make sure that you:  Do at least 150 minutes of moderate-intensity or vigorous-intensity exercise each week. This could be brisk walking, biking, or water aerobics. ? Do stretching and strength exercises, such as yoga or weightlifting, at least 2 times a week. ? Spread out your activity over at least 3 days of the week.  Get some form of physical activity every day. ? Do not go more than 2 days in a row without some kind of physical activity. ? Avoid being inactive for more than 30 minutes at a time. Take frequent breaks to walk or stretch.  Choose a type of exercise or activity that you enjoy, and set realistic goals.  Start slowly, and gradually increase the intensity of your exercise over time. What do I need to know about managing my  diabetes?   Check your blood glucose before and after exercising. ? If your blood glucose is 240 mg/dL (13.3 mmol/L) or higher before you exercise, check your urine for ketones. If you have ketones in your urine, do not exercise until your blood glucose returns to normal. ? If your blood glucose is 100 mg/dL (5.6 mmol/L) or lower, eat a snack containing 15-20 grams of carbohydrate. Check your blood glucose 15 minutes after the snack to make sure that your level is above 100 mg/dL (5.6 mmol/L) before you start your exercise.  Know the symptoms of low blood glucose (hypoglycemia) and how to treat it. Your risk for hypoglycemia increases during and after exercise. Common symptoms of hypoglycemia can include: ? Hunger. ? Anxiety. ? Sweating and feeling clammy. ? Confusion. ? Dizziness or feeling light-headed. ? Increased heart rate or palpitations. ? Blurry vision. ? Tingling or numbness around the mouth, lips, or tongue. ? Tremors or shakes. ? Irritability.  Keep a rapid-acting carbohydrate snack available before, during, and after exercise to help prevent or treat hypoglycemia.  Avoid injecting insulin into areas of the body that are going to be exercised. For example, avoid injecting insulin into: ? The arms, when playing tennis. ? The legs, when jogging.  Keep records of your exercise habits. Doing this can help you and your health care provider adjust your diabetes management plan as needed. Write down: ? Food that you eat before and after you exercise. ? Blood glucose levels before and after you exercise. ? The type and amount of exercise you have done. ? When your insulin is expected to peak, if you use   insulin. Avoid exercising at times when your insulin is peaking.  When you start a new exercise or activity, work with your health care provider to make sure the activity is safe for you, and to adjust your insulin, medicines, or food intake as needed.  Drink plenty of water while  you exercise to prevent dehydration or heat stroke. Drink enough fluid to keep your urine clear or pale yellow. Summary  Exercising regularly is important for your overall health, especially when you have diabetes (diabetes mellitus).  Exercising has many health benefits, such as increasing muscle strength and bone density and reducing body fat and stress.  Your health care provider or certified diabetes educator can help you make a plan for the type and frequency of exercise (activity plan) that works for you.  When you start a new exercise or activity, work with your health care provider to make sure the activity is safe for you, and to adjust your insulin, medicines, or food intake as needed. This information is not intended to replace advice given to you by your health care provider. Make sure you discuss any questions you have with your health care provider. Document Released: 07/05/2003 Document Revised: 11/06/2016 Document Reviewed: 09/24/2015 Elsevier Patient Education  2020 [employer] Inc.  

## 2019-02-03 ENCOUNTER — Telehealth: Payer: Self-pay

## 2019-02-03 NOTE — Telephone Encounter (Signed)
Patient advised as below.  

## 2019-02-03 NOTE — Progress Notes (Signed)
Patient: [name] Walker Male    DOB: 1975-12-20   43 y.o.   MRN: [id number] Visit Date: 02/04/2019  Today's Provider: Trey Sailors, PA-C   Chief Complaint  Patient presents with  . Hand Pain   Subjective:     HPI   Patient is here concerning numbness in his right fingertips x 1 week. Patient reports numbness is mostly in right finger tips. Patient reports thumb in left had is also numb. Patient reports symptoms are constant. Reports first four fingers in right hand are numb but not his pinky. Reports it is not worse in the morning when he gets up. Feels numbness starting at the tips going at your palm.   Patient c/o not being able to sleep due to waking up due to going to the bathroom. He has sleep apnea and has not been wearing his CPAP consistently.   Patient is wondering when he can get his hernia repaired. He had hernia repaired several years ago with [facility].   BP Readings from Last 3 Encounters:  02/04/19 138/88  01/17/19 138/82  12/15/18 (!) 137/98   Wt Readings from Last 3 Encounters:  02/04/19 (!) 307 lb 3.2 oz (139.3 kg)  01/17/19 (!) 311 lb (141.1 kg)  12/15/18 298 lb (135.2 kg)     Allergies  Allergen Reactions  . Ace Inhibitors Cough  . Amlodipine     Pedal edema  . Metformin And Related     diarrhea  . Other      Current Outpatient Medications:  .  apixaban (ELIQUIS) 5 MG TABS tablet, Take 1 tablet (5 mg total) by mouth 2 (two) times daily., Disp: 60 tablet, Rfl: 1 .  atorvastatin (LIPITOR) 80 MG tablet, Take 1 tablet (80 mg total) by mouth daily., Disp: 90 tablet, Rfl: 3 .  colchicine 0.6 MG tablet, Take 1.2 mg or two tablets at first. Then one hour later, take 0.6 mg., Disp: 30 tablet, Rfl: 0 .  Dulaglutide (TRULICITY) 1.5 MG/0.5ML SOPN, Inject 1.5 mg into the skin once a week., Disp: 2 mL, Rfl: 2 .  gabapentin (NEURONTIN) 600 MG tablet, TAKE 1 TABLET BY MOUTH THREE TIMES DAILY, Disp: 270 tablet, Rfl: 0 .  losartan (COZAAR) 25 MG  tablet, Take 1 tablet (25 mg total) by mouth daily., Disp: 90 tablet, Rfl: 0 .  metoprolol succinate (TOPROL-XL) 50 MG 24 hr tablet, TAKE 1 TABLET BY MOUTH ONCE DAILY (TAKE  WITH  OR  IMMEDIATELY  FOLLOWING  A  MEAL), Disp: 90 tablet, Rfl: 1 .  NON FORMULARY, CPAP everynight, Disp: , Rfl:  .  torsemide (DEMADEX) 10 MG tablet, TAKE 1 TABLET BY MOUTH ONCE DAILY AS NEEDED FOR SWELLING. MAY TAKE ONE HALF TABLET INITIALLY, Disp: , Rfl:   Review of Systems  Constitutional: Positive for fatigue. Negative for appetite change, chills and fever.  Respiratory: Positive for apnea. Negative for chest tightness, shortness of breath and wheezing.   Cardiovascular: Negative for chest pain and palpitations.  Gastrointestinal: Negative for abdominal pain, nausea and vomiting.  Neurological: Positive for numbness.    Social History   Tobacco Use  . Smoking status: Former Smoker    Types: Cigarettes    Quit date: 2020    Years since quitting: 0.7  . Smokeless tobacco: Never Used  Substance Use Topics  . Alcohol use: Not Currently      Objective:   BP 138/88 (BP Location: Left Arm, Patient Position: Sitting, Cuff Size: Large)  Pulse (!) 105   Temp (!) 97.3 F (36.3 C) (Temporal)   Resp 16   Wt (!) 307 lb 3.2 oz (139.3 kg)   BMI 45.37 kg/m  Vitals:   02/04/19 0846  BP: 138/88  Pulse: (!) 105  Resp: 16  Temp: (!) 97.3 F (36.3 C)  TempSrc: Temporal  Weight: (!) 307 lb 3.2 oz (139.3 kg)  Body mass index is 45.37 kg/m.   Physical Exam Constitutional:      Appearance: He is obese.  Musculoskeletal:     Comments: Tinels positive on right side. 5/5 grip strength bilaterally.   Neurological:     Mental Status: He is oriented to person, place, and time. Mental status is at baseline.  Psychiatric:        Mood and Affect: Mood normal.        Behavior: Behavior normal.      No results found for any visits on 02/04/19.     Assessment & Plan    1. Bilateral carpal tunnel  syndrome  Counseled on wearing bilateral cock up wrist splints at night. He is not candidate for NSAIDs currently due to kidney function. Oral steroids would not be ideal due to diabetes. Will have him see orthopedics for possible steroid injection.   - Ambulatory referral to Orthopedics  2. OSA (obstructive sleep apnea)  He should wear a CPAP. He is likely urinating frequently at night due to noncompliance with CPAP.   3. Insomnia, unspecified type  Likely due to noncompliance with CPAP. Advised to wear CPAP.   4. Recurrent abdominal hernia without obstruction or gangrene, unspecified hernia type  I am happy to have him be surgically evaluated. Advised that surgeon may postpone surgery until he can lose weight.  5. Left-sided back pain, unspecified back location, unspecified chronicity  History of left sided lumbar radiculopathy. Have tried gabapentin and meloxicam previously that was not effective. We will have him see orthopedics. Encouraged to keep up with weight loss.   - Ambulatory referral to Orthopedics  The entirety of the information documented in the History of Present Illness, Review of Systems and Physical Exam were personally obtained by me. Portions of this information were initially documented by Lynford Humphrey, CMA and reviewed by me for thoroughness and accuracy.       Trinna Post, PA-C  [facility]

## 2019-02-03 NOTE — Telephone Encounter (Signed)
Agree on OV.

## 2019-02-03 NOTE — Telephone Encounter (Signed)
Patient called stating for the past week he has had strong sensation of tingling and numbness in his fingertips. His hands sometimes feels tight when this happens. Patient wants to know what you recommend. I advised patient that he probably needs an office visit for evaluation. Appt scheduled for tomorrow, but patient wanted to have a message sent to Fabio Bering to see what she thinks. Please advise.

## 2019-02-04 ENCOUNTER — Ambulatory Visit: Payer: BC Managed Care – PPO | Admitting: Physician Assistant

## 2019-02-04 ENCOUNTER — Encounter: Payer: Self-pay | Admitting: Physician Assistant

## 2019-02-04 ENCOUNTER — Other Ambulatory Visit: Payer: Self-pay

## 2019-02-04 VITALS — BP 138/88 | HR 105 | Temp 97.3°F | Resp 16 | Wt 307.2 lb

## 2019-02-04 DIAGNOSIS — G4700 Insomnia, unspecified: Secondary | ICD-10-CM | POA: Diagnosis not present

## 2019-02-04 DIAGNOSIS — G4733 Obstructive sleep apnea (adult) (pediatric): Secondary | ICD-10-CM | POA: Diagnosis not present

## 2019-02-04 DIAGNOSIS — K458 Other specified abdominal hernia without obstruction or gangrene: Secondary | ICD-10-CM | POA: Diagnosis not present

## 2019-02-04 DIAGNOSIS — M549 Dorsalgia, unspecified: Secondary | ICD-10-CM

## 2019-02-04 DIAGNOSIS — G5603 Carpal tunnel syndrome, bilateral upper limbs: Secondary | ICD-10-CM | POA: Diagnosis not present

## 2019-02-04 NOTE — Patient Instructions (Signed)
Cock up wrist splint - wear this both sides at night for four weeks.      Carpal Tunnel Syndrome  Carpal tunnel syndrome is a condition that causes pain in your hand and arm. The carpal tunnel is a narrow area that is on the palm side of your wrist. Repeated wrist motion or certain diseases may cause swelling in the tunnel. This swelling can pinch the main nerve in the wrist (median nerve). What are the causes? This condition may be caused by:  Repeated wrist motions.  Wrist injuries.  Arthritis.  A sac of fluid (cyst) or abnormal growth (tumor) in the carpal tunnel.  Fluid buildup during pregnancy. Sometimes the cause is not known. What increases the risk? The following factors may make you more likely to develop this condition:  Having a job in which you move your wrist in the same way many times. This includes jobs like being a Midwife or a Conservation officer, nature.  Being a woman.  Having other health conditions, such as: ? Diabetes. ? Obesity. ? A thyroid gland that is not active enough (hypothyroidism). ? Kidney failure. What are the signs or symptoms? Symptoms of this condition include:  A tingling feeling in your fingers.  Tingling or a loss of feeling (numbness) in your hand.  Pain in your entire arm. This pain may get worse when you bend your wrist and elbow for a long time.  Pain in your wrist that goes up your arm to your shoulder.  Pain that goes down into your palm or fingers.  A weak feeling in your hands. You may find it hard to grab and hold items. You may feel worse at night. How is this diagnosed? This condition is diagnosed with a medical history and physical exam. You may also have tests, such as:  Electromyogram (EMG). This test checks the signals that the nerves send to the muscles.  Nerve conduction study. This test checks how well signals pass through your nerves.  Imaging tests, such as X-rays, ultrasound, and MRI. These tests check for what might be  the cause of your condition. How is this treated? This condition may be treated with:  Lifestyle changes. You will be asked to stop or change the activity that caused your problem.  Doing exercise and activities that make bones and muscles stronger (physical therapy).  Learning how to use your hand again (occupational therapy).  Medicines for pain and swelling (inflammation). You may have injections in your wrist.  A wrist splint.  Surgery. Follow these instructions at home: If you have a splint:  Wear the splint as told by your doctor. Remove it only as told by your doctor.  Loosen the splint if your fingers: ? Tingle. ? Lose feeling (become numb). ? Turn cold and blue.  Keep the splint clean.  If the splint is not waterproof: ? Do not let it get wet. ? Cover it with a watertight covering when you take a bath or a shower. Managing pain, stiffness, and swelling   If told, put ice on the painful area: ? If you have a removable splint, remove it as told by your doctor. ? Put ice in a plastic bag. ? Place a towel between your skin and the bag. ? Leave the ice on for 20 minutes, 2-3 times per day. General instructions  Take over-the-counter and prescription medicines only as told by your doctor.  Rest your wrist from any activity that may cause pain. If needed, talk with your boss  at work about changes that can help your wrist heal.  Do any exercises as told by your doctor, physical therapist, or occupational therapist.  Keep all follow-up visits as told by your doctor. This is important. Contact a doctor if:  You have new symptoms.  Medicine does not help your pain.  Your symptoms get worse. Get help right away if:  You have very bad numbness or tingling in your wrist or hand. Summary  Carpal tunnel syndrome is a condition that causes pain in your hand and arm.  It is often caused by repeated wrist motions.  Lifestyle changes and medicines are used to treat  this problem. Surgery may help in very bad cases.  Follow your doctor's instructions about wearing a splint, resting your wrist, keeping follow-up visits, and calling for help. This information is not intended to replace advice given to you by your health care provider. Make sure you discuss any questions you have with your health care provider. Document Released: 04/03/2011 Document Revised: 08/21/2017 Document Reviewed: 08/21/2017 Elsevier Patient Education  2020 [employer].

## 2019-02-09 ENCOUNTER — Encounter: Payer: Self-pay | Admitting: Physician Assistant

## 2019-02-09 DIAGNOSIS — I2699 Other pulmonary embolism without acute cor pulmonale: Secondary | ICD-10-CM

## 2019-02-14 DIAGNOSIS — Z6841 Body Mass Index (BMI) 40.0 and over, adult: Secondary | ICD-10-CM | POA: Insufficient documentation

## 2019-02-16 ENCOUNTER — Telehealth: Payer: Self-pay | Admitting: Physician Assistant

## 2019-02-16 DIAGNOSIS — I2699 Other pulmonary embolism without acute cor pulmonale: Secondary | ICD-10-CM

## 2019-02-16 NOTE — Telephone Encounter (Signed)
I completed it, thank you.

## 2019-02-16 NOTE — Telephone Encounter (Signed)
Insurance company would like to speak peer to peer to get [place] for CTA of chest.Phone [phone].Member # [id number]

## 2019-02-16 NOTE — Telephone Encounter (Signed)
[name] Walker, I placed an order for Hematology and Oncology referral. What diagnosis code should I use to associate?

## 2019-02-16 NOTE — Addendum Note (Signed)
Addended by: Trinna Post on: 02/16/2019 02:58 PM   Modules accepted: Orders

## 2019-02-16 NOTE — Addendum Note (Signed)
Addended by: Randal Buba on: 02/16/2019 02:53 PM   Modules accepted: Orders

## 2019-02-16 NOTE — Telephone Encounter (Signed)
Completed peer to peer at 2:40 PM. CTA chest for surveillance or pulmonary embolism was denied by his insurance company. Insurance company stated he would have to be experiencing worsening symptoms to get imaging approved. Request was withdrawn so it would not be officially denied and create a period where we could not order image again. I would recommend having patient see our hematologist/oncologist to consult on whether he will need to continue blood thinners.

## 2019-02-16 NOTE — Telephone Encounter (Signed)
Patient advised and agrees with referral to Hematologist/ oncology. Please schedule appointment. Patient prefers appointment to be on a Monday.

## 2019-02-18 ENCOUNTER — Other Ambulatory Visit: Payer: Self-pay

## 2019-02-21 ENCOUNTER — Other Ambulatory Visit: Payer: Self-pay

## 2019-02-21 ENCOUNTER — Inpatient Hospital Stay: Payer: BC Managed Care – PPO | Attending: Oncology | Admitting: Oncology

## 2019-02-21 ENCOUNTER — Inpatient Hospital Stay: Payer: BC Managed Care – PPO

## 2019-02-21 VITALS — BP 145/92 | HR 91 | Temp 98.2°F | Resp 17 | Ht 69.0 in | Wt 304.0 lb

## 2019-02-21 DIAGNOSIS — I2699 Other pulmonary embolism without acute cor pulmonale: Secondary | ICD-10-CM

## 2019-02-21 DIAGNOSIS — Z7901 Long term (current) use of anticoagulants: Secondary | ICD-10-CM | POA: Insufficient documentation

## 2019-02-21 DIAGNOSIS — I2694 Multiple subsegmental pulmonary emboli without acute cor pulmonale: Secondary | ICD-10-CM

## 2019-02-21 LAB — COMPREHENSIVE METABOLIC PANEL
ALT: 20 U/L (ref 0–44)
AST: 20 U/L (ref 15–41)
Albumin: 4.1 g/dL (ref 3.5–5.0)
Alkaline Phosphatase: 71 U/L (ref 38–126)
Anion gap: 6 (ref 5–15)
BUN: 22 mg/dL — ABNORMAL HIGH (ref 6–20)
CO2: 33 mmol/L — ABNORMAL HIGH (ref 22–32)
Calcium: 9.6 mg/dL (ref 8.9–10.3)
Chloride: 100 mmol/L (ref 98–111)
Creatinine, Ser: 1.39 mg/dL — ABNORMAL HIGH (ref 0.61–1.24)
GFR calc Af Amer: >60 mL/min (ref >60)
GFR calc non Af Amer: >60 mL/min (ref >60)
Glucose, Bld: 89 mg/dL (ref 70–99)
Potassium: 3.5 mmol/L (ref 3.5–5.1)
Sodium: 139 mmol/L (ref 135–145)
Total Bilirubin: 0.6 mg/dL (ref 0.3–1.2)
Total Protein: 8.1 g/dL (ref 6.5–8.1)

## 2019-02-21 LAB — ANTITHROMBIN III: AntiThromb III Func: 93 % (ref 75–120)

## 2019-02-21 MED ORDER — APIXABAN 5 MG PO TABS: 5.0000 mg | ORAL_TABLET | Freq: Two times a day (BID) | ORAL | 2 refills | Status: DC

## 2019-02-22 ENCOUNTER — Encounter: Payer: Self-pay | Admitting: Oncology

## 2019-02-22 LAB — CARDIOLIPIN ANTIBODIES, IGG, IGM, IGA
Anticardiolipin IgA: <9 APL U/mL (ref 0–11)
Anticardiolipin IgG: <9 GPL U/mL (ref 0–14)
Anticardiolipin IgM: <9 MPL U/mL (ref 0–12)

## 2019-02-22 LAB — BETA-2-GLYCOPROTEIN I ABS, IGG/M/A
Beta-2 Glyco I IgG: <9 GPI IgG units (ref 0–20)
Beta-2-Glycoprotein I IgA: <9 GPI IgA units (ref 0–25)
Beta-2-Glycoprotein I IgM: <9 GPI IgM units (ref 0–32)

## 2019-02-22 NOTE — Progress Notes (Signed)
Hematology/Oncology Consult note [facility] Telephone:([phone] [phone]  Patient Care Team: Maryella Shivers as PCP - General (Physician Assistant)   Name of the patient: [name] Walker  [id number]  01-19-1976    Reason for referral-anticoagulation management for pulmonary embolism   Referring physician-Adriana Jonnie Finner, [place]  Date of visit: 02/22/19   History of presenting illness-patient is a 43 year old African-American male who was found to have septic arthritis of the left knee which required hospitalization in July 2020.  This was complicated by rhabdomyolysis requiring hospitalization and IV fluids as well as possible pneumonia.  During that hospital admission patient underwent CT chest which showed Pulmonary embolism in the right interlobar pulmonary artery and right lower lobe segmental and subsegmental branches with no evidence of right heart strain.  Patient was discharged on Eliquis.  Patient does have a history of morbid obesity with a BMI of 44.  He is also a current smoker.  Patient has tolerated Eliquis well so far without any significant side effects.  Patient was found to have evidence of AKI during the hospitalization which is gradually improving but creatinine is still not back to baseline.  No prior history of any DVT or PE.  No family history of thrombosis.  ECOG PS- 1  Pain scale- 0   Review of systems- Review of Systems  Constitutional: Negative for chills, fever, malaise/fatigue and weight loss.  HENT: Negative for congestion, ear discharge and nosebleeds.   Eyes: Negative for blurred vision.  Respiratory: Negative for cough, hemoptysis, sputum production, shortness of breath and wheezing.   Cardiovascular: Negative for chest pain, palpitations, orthopnea and claudication.  Gastrointestinal: Negative for abdominal pain, blood in stool, constipation, diarrhea, heartburn, melena, nausea and vomiting.  Genitourinary: Negative  for dysuria, flank pain, frequency, hematuria and urgency.  Musculoskeletal: Negative for back pain, joint pain and myalgias.  Skin: Negative for rash.  Neurological: Negative for dizziness, tingling, focal weakness, seizures, weakness and headaches.  Endo/Heme/Allergies: Does not bruise/bleed easily.  Psychiatric/Behavioral: Negative for depression and suicidal ideas. The patient does not have insomnia.     Allergies  Allergen Reactions  . Ace Inhibitors Cough  . Amlodipine     Pedal edema  . Metformin And Related     diarrhea  . Other     Patient Active Problem List   Diagnosis Date Noted  . Morbid obesity (HCC) 09/10/2018  . History of gout 03/11/2018  . Hip pain 08/18/2016  . Lumbar radiculopathy 08/18/2016  . Chronic bilateral low back pain without sciatica 08/05/2016  . Acute anxiety 08/05/2016  . Essential (primary) hypertension 10/31/2015  . Diabetes mellitus with nephropathy (HCC) 10/31/2015  . HLD (hyperlipidemia) 10/31/2015  . OSA (obstructive sleep apnea) 10/31/2015  . Exomphalos 10/31/2015     Past Medical History:  Diagnosis Date  . Hypertension   . Sleep apnea      Past Surgical History:  Procedure Laterality Date  . TONSILLECTOMY  1985    Social History   Socioeconomic History  . Marital status: Married    Spouse name: Not on file  . Number of children: Not on file  . Years of education: Not on file  . Highest education level: Not on file  Occupational History  . Occupation: Paediatric nurse  Social Needs  . Financial resource strain: Not on file  . Food insecurity    Worry: Not on file    Inability: Not on file  . Transportation needs    Medical: Not on file  Non-medical: Not on file  Tobacco Use  . Smoking status: Former Smoker    Types: Cigarettes    Quit date: 2020    Years since quitting: 0.8  . Smokeless tobacco: Never Used  Substance and Sexual Activity  . Alcohol use: Not Currently  . Drug use: Never  . Sexual activity: Yes     Birth control/protection: None  Lifestyle  . Physical activity    Days per week: 1 day    Minutes per session: 10 min  . Stress: Only a little  Relationships  . Social Musicianconnections    Talks on phone: Twice a week    Gets together: Twice a week    Attends religious service: 1 to 4 times per year    Active member of club or organization: No    Attends meetings of clubs or organizations: Never    Relationship status: Married  . Intimate partner violence    Fear of current or ex partner: No    Emotionally abused: No    Physically abused: No    Forced sexual activity: No  Other Topics Concern  . Not on file  Social History Narrative  . Not on file     Family History  Problem Relation Age of Onset  . Diabetes Mother   . Cancer Mother        lung     Current Outpatient Medications:  .  atorvastatin (LIPITOR) 80 MG tablet, Take 1 tablet (80 mg total) by mouth daily., Disp: 90 tablet, Rfl: 3 .  Dulaglutide (TRULICITY) 1.5 MG/0.5ML SOPN, Inject 1.5 mg into the skin once a week., Disp: 2 mL, Rfl: 2 .  gabapentin (NEURONTIN) 600 MG tablet, TAKE 1 TABLET BY MOUTH THREE TIMES DAILY, Disp: 270 tablet, Rfl: 0 .  hydrochlorothiazide (HYDRODIURIL) 25 MG tablet, Take by mouth., Disp: , Rfl:  .  losartan (COZAAR) 25 MG tablet, Take 1 tablet (25 mg total) by mouth daily., Disp: 90 tablet, Rfl: 0 .  metoprolol succinate (TOPROL-XL) 50 MG 24 hr tablet, TAKE 1 TABLET BY MOUTH ONCE DAILY (TAKE  WITH  OR  IMMEDIATELY  FOLLOWING  A  MEAL), Disp: 90 tablet, Rfl: 1 .  NON FORMULARY, CPAP everynight, Disp: , Rfl:  .  apixaban (ELIQUIS) 5 MG TABS tablet, Take 1 tablet (5 mg total) by mouth 2 (two) times daily., Disp: 60 tablet, Rfl: 2 .  colchicine 0.6 MG tablet, Take 1.2 mg or two tablets at first. Then one hour later, take 0.6 mg. (Patient not taking: Reported on 02/21/2019), Disp: 30 tablet, Rfl: 0 .  torsemide (DEMADEX) 10 MG tablet, TAKE 1 TABLET BY MOUTH ONCE DAILY AS NEEDED FOR SWELLING. MAY TAKE  ONE HALF TABLET INITIALLY, Disp: , Rfl:    Physical exam:  Vitals:   02/21/19 1350  BP: (!) 145/92  Pulse: 91  Resp: 17  Temp: 98.2 F (36.8 C)  TempSrc: Tympanic  SpO2: 96%  Weight: (!) 304 lb (137.9 kg)  Height: 5\' 9"  (1.753 m)   Physical Exam Constitutional:      General: He is not in acute distress.    Appearance: He is obese.  HENT:     Head: Normocephalic and atraumatic.  Eyes:     Pupils: Pupils are equal, round, and reactive to light.  Neck:     Musculoskeletal: Normal range of motion.  Cardiovascular:     Rate and Rhythm: Normal rate and regular rhythm.     Heart sounds: Normal heart sounds.  Pulmonary:     Effort: Pulmonary effort is normal.     Breath sounds: Normal breath sounds.  Abdominal:     General: Bowel sounds are normal.     Palpations: Abdomen is soft.  Skin:    General: Skin is warm and dry.  Neurological:     Mental Status: He is alert and oriented to person, place, and time.        CMP Latest Ref Rng & Units 02/21/2019  Glucose 70 - 99 mg/dL 89  BUN 6 - 20 mg/dL 22(H)  Creatinine 0.61 - 1.24 mg/dL 1.39(H)  Sodium 135 - 145 mmol/L 139  Potassium 3.5 - 5.1 mmol/L 3.5  Chloride 98 - 111 mmol/L 100  CO2 22 - 32 mmol/L 33(H)  Calcium 8.9 - 10.3 mg/dL 9.6  Total Protein 6.5 - 8.1 g/dL 8.1  Total Bilirubin 0.3 - 1.2 mg/dL 0.6  Alkaline Phos 38 - 126 U/L 71  AST 15 - 41 U/L 20  ALT 0 - 44 U/L 20   CBC Latest Ref Rng & Units 12/15/2018  WBC 3.4 - 10.8 x10E3/uL 4.9  Hemoglobin 13.0 - 17.7 g/dL 11.6(L)  Hematocrit 37.5 - 51.0 % 34.8(L)  Platelets 150 - 450 x10E3/uL 133(L)    Assessment and plan- Patient is a 43 y.o. male with history of pulmonary embolism in July 2020 currently on Eliquis referred for further anticoagulation management  Patient's first episode of pulmonary embolism appears provoked in the setting of hospitalization for septic arthritis which was further complicated by AKI and rhabdomyolysis.  Typically for provoked  pulmonary embolism in the absence of right heart strain I would recommend 6 months of anticoagulation.  However patient has multiple risk factors including obesity, male sex and smoking history which increases the risk of subsequent episodes of thrombosis.  I will therefore obtain a hypercoagulable work-up including protein C, protein S, Antithrombin III, factor V Leiden, prothrombin gene mutation as well as antiphospholipid antibody syndrome testing.  I will see the patient back in 2 weeks time for a video visit to discuss the results of the blood work and duration of anticoagulation.  Newer anticoagulants like Eliquis were originally not studied in obese patients who are greater than 200 pounds.  However there have been recent studies evaluating newer anticoagulants and obese patients and have been found to be safe.  I would therefore recommend continuing Eliquis at this time.  I have also repeated his kidney functions today and his GFR is currently normal with a mildly elevated serum creatinine of 1.39.  It would therefore be safe to continue Eliquis at this time   Thank you for this kind referral and the opportunity to participate in the care of this patient   Visit Diagnosis 1. Multiple subsegmental pulmonary emboli without acute cor pulmonale (HCC)   2. Pulmonary embolism, other, unspecified chronicity, unspecified whether acute cor pulmonale present ([place])     Dr. Randa Evens, MD, MPH [facility] [phone] 02/22/2019  9:01 AM

## 2019-02-23 LAB — PROTEIN C, TOTAL: Protein C, Total: 89 % (ref 60–150)

## 2019-02-24 LAB — FACTOR 5 LEIDEN

## 2019-02-25 LAB — PROTEIN S PANEL
Protein S Activity: 80 % (ref 63–140)
Protein S Ag, Free: 87 % (ref 57–157)
Protein S Ag, Total: 103 % (ref 60–150)

## 2019-02-25 LAB — LUPUS ANTICOAGULANT
DRVVT: 29.4 s (ref 0.0–47.0)
PTT Lupus Anticoagulant: 33.6 s (ref 0.0–51.9)
Thrombin Time: 18.3 s (ref 0.0–23.0)
dPT Confirm Ratio: 0.89 ratio (ref 0.00–1.40)
dPT: 37.3 s (ref 0.0–55.0)

## 2019-02-25 LAB — PROTHROMBIN GENE MUTATION

## 2019-02-25 LAB — HEXAGONAL PHASE PHOSPHOLIPID: Hex Phosph Neut Test: 1 s (ref 0–11)

## 2019-02-25 LAB — HEX PHASE PHOSPHOLIPID REFLEX

## 2019-02-28 ENCOUNTER — Encounter: Payer: Self-pay | Admitting: Oncology

## 2019-03-03 NOTE — Telephone Encounter (Signed)
Hypercoagulable work up negative so far. Will explain everything during video visit

## 2019-03-14 ENCOUNTER — Inpatient Hospital Stay: Payer: BC Managed Care – PPO | Attending: Oncology | Admitting: Oncology

## 2019-03-14 ENCOUNTER — Other Ambulatory Visit: Payer: Self-pay

## 2019-03-14 ENCOUNTER — Encounter: Payer: Self-pay | Admitting: Oncology

## 2019-03-14 VITALS — BP 158/95 | HR 102 | Temp 97.7°F | Resp 16 | Ht 69.0 in | Wt 315.0 lb

## 2019-03-14 DIAGNOSIS — Z86711 Personal history of pulmonary embolism: Secondary | ICD-10-CM | POA: Insufficient documentation

## 2019-03-14 DIAGNOSIS — I2694 Multiple subsegmental pulmonary emboli without acute cor pulmonale: Secondary | ICD-10-CM

## 2019-03-14 DIAGNOSIS — Z7901 Long term (current) use of anticoagulants: Secondary | ICD-10-CM | POA: Insufficient documentation

## 2019-03-14 NOTE — Progress Notes (Signed)
Pt here to get results of tests that he had 2 weeks ago

## 2019-03-15 ENCOUNTER — Encounter: Payer: Self-pay | Admitting: Oncology

## 2019-03-15 NOTE — Progress Notes (Signed)
Hematology/Oncology Consult note [facility]  [phone] [phone]  Patient Care Team: Maryella ShiversPollak, Adriana M, PA-C as PCP - General (Physician Assistant)   Name of the patient: [name] Walker  [id number]  Jun 20, 1975   Date of visit: 03/15/19  Diagnosis-provoked pulmonary embolism currently on Eliquis  Chief complaint/ Reason for visit-discussed results of hypercoagulable work-up  Heme/Onc history: patient is a 43 year old African-American male who was found to have septic arthritis of the left knee which required hospitalization in July 2020.  This was complicated by rhabdomyolysis requiring hospitalization and IV fluids as well as possible pneumonia.  During that hospital admission patient underwent CT chest which showed Pulmonary embolism in the right interlobar pulmonary artery and right lower lobe segmental and subsegmental branches with no evidence of right heart strain.  Patient was discharged on Eliquis.  Patient does have a history of morbid obesity with a BMI of 44.  He is also a current smoker.  Patient has tolerated Eliquis well so far without any significant side effects.  Patient was found to have evidence of AKI during the hospitalization which is gradually improving but creatinine is still not back to baseline.  No prior history of any DVT or PE.  No family history of thrombosis.  Hypercoagulable work-up showed no evidence of prothrombin gene mutation of factor V Leiden mutation.  Protein C protein S and Antithrombin III levels were normal.  Antiphospholipid antibody syndrome testing was negative.  Interval history-patient is tolerating Eliquis well without any significant side effects.  ECOG PS- 1 Pain scale- 0   Review of systems- Review of Systems  Constitutional: Negative for chills, fever, malaise/fatigue and weight loss.  HENT: Negative for congestion, ear discharge and nosebleeds.   Eyes: Negative for blurred vision.   Respiratory: Negative for cough, hemoptysis, sputum production, shortness of breath and wheezing.   Cardiovascular: Negative for chest pain, palpitations, orthopnea and claudication.  Gastrointestinal: Negative for abdominal pain, blood in stool, constipation, diarrhea, heartburn, melena, nausea and vomiting.  Genitourinary: Negative for dysuria, flank pain, frequency, hematuria and urgency.  Musculoskeletal: Negative for back pain, joint pain and myalgias.  Skin: Negative for rash.  Neurological: Negative for dizziness, tingling, focal weakness, seizures, weakness and headaches.  Endo/Heme/Allergies: Does not bruise/bleed easily.  Psychiatric/Behavioral: Negative for depression and suicidal ideas. The patient does not have insomnia.        Allergies  Allergen Reactions  . Ace Inhibitors Cough  . Amlodipine     Pedal edema  . Daptomycin Other (See Comments)    Makes CK levels go very high  . Metformin And Related     diarrhea  . Other     Seasonal allergies      Past Medical History:  Diagnosis Date  . Hypertension   . pre diabetes    hgb A!C 6.0  . Pulmonary embolism (HCC)   . Sleep apnea      Past Surgical History:  Procedure Laterality Date  . TONSILLECTOMY  1985  . UMBILICAL HERNIA REPAIR      Social History   Socioeconomic History  . Marital status: Married    Spouse name: Not on file  . Number of children: Not on file  . Years of education: Not on file  . Highest education level: Not on file  Occupational History  . Occupation: Paediatric nurseBarber  Social Needs  . Financial resource strain: Not on file  . Food insecurity    Worry: Not on file  Inability: Not on file  . Transportation needs    Medical: Not on file    Non-medical: Not on file  Tobacco Use  . Smoking status: Former Smoker    Types: Cigarettes    Quit date: 2020    Years since quitting: 0.8  . Smokeless tobacco: Never Used  Substance and Sexual Activity  . Alcohol use: Not Currently  .  Drug use: Never  . Sexual activity: Yes    Birth control/protection: None  Lifestyle  . Physical activity    Days per week: 1 day    Minutes per session: 10 min  . Stress: Only a little  Relationships  . Social Herbalist on phone: Twice a week    Gets together: Twice a week    Attends religious service: 1 to 4 times per year    Active member of club or organization: No    Attends meetings of clubs or organizations: Never    Relationship status: Married  . Intimate partner violence    Fear of current or ex partner: No    Emotionally abused: No    Physically abused: No    Forced sexual activity: No  Other Topics Concern  . Not on file  Social History Narrative  . Not on file    Family History  Problem Relation Age of Onset  . Diabetes Mother   . Cancer Mother        lung     Current Outpatient Medications:  .  apixaban (ELIQUIS) 5 MG TABS tablet, Take 1 tablet (5 mg total) by mouth 2 (two) times daily., Disp: 60 tablet, Rfl: 2 .  atorvastatin (LIPITOR) 80 MG tablet, Take 1 tablet (80 mg total) by mouth daily., Disp: 90 tablet, Rfl: 3 .  Dulaglutide (TRULICITY) 1.5 RC/7.8LF SOPN, Inject 1.5 mg into the skin once a week., Disp: 2 mL, Rfl: 2 .  gabapentin (NEURONTIN) 600 MG tablet, TAKE 1 TABLET BY MOUTH THREE TIMES DAILY, Disp: 270 tablet, Rfl: 0 .  hydrochlorothiazide (HYDRODIURIL) 25 MG tablet, Take 25 mg by mouth daily. , Disp: , Rfl:  .  losartan (COZAAR) 25 MG tablet, Take 1 tablet (25 mg total) by mouth daily., Disp: 90 tablet, Rfl: 0 .  metoprolol succinate (TOPROL-XL) 50 MG 24 hr tablet, TAKE 1 TABLET BY MOUTH ONCE DAILY (TAKE  WITH  OR  IMMEDIATELY  FOLLOWING  A  MEAL), Disp: 90 tablet, Rfl: 1 .  NON FORMULARY, CPAP everynight, Disp: , Rfl:  .  colchicine 0.6 MG tablet, Take 1.2 mg or two tablets at first. Then one hour later, take 0.6 mg. (Patient not taking: Reported on 02/21/2019), Disp: 30 tablet, Rfl: 0  Physical exam:  Vitals:   03/14/19 1327   BP: (!) 158/95  Pulse: (!) 102  Resp: 16  Temp: 97.7 F (36.5 C)  TempSrc: Tympanic  Weight: (!) 315 lb (142.9 kg)  Height: 5\' 9"  (1.753 m)   Physical Exam Constitutional:      General: He is not in acute distress.    Appearance: He is obese.  HENT:     Head: Normocephalic and atraumatic.  Eyes:     Pupils: Pupils are equal, round, and reactive to light.  Neck:     Musculoskeletal: Normal range of motion.  Cardiovascular:     Rate and Rhythm: Normal rate and regular rhythm.     Heart sounds: Normal heart sounds.  Pulmonary:     Effort: Pulmonary effort is normal.  Breath sounds: Normal breath sounds.  Abdominal:     General: Bowel sounds are normal.     Palpations: Abdomen is soft.  Skin:    General: Skin is warm and dry.  Neurological:     Mental Status: He is alert and oriented to person, place, and time.      CMP Latest Ref Rng & Units 02/21/2019  Glucose 70 - 99 mg/dL 89  BUN 6 - 20 mg/dL 78(E)  Creatinine 4.23 - 1.24 mg/dL 5.36(R)  Sodium 443 - 154 mmol/L 139  Potassium 3.5 - 5.1 mmol/L 3.5  Chloride 98 - 111 mmol/L 100  CO2 22 - 32 mmol/L 33(H)  Calcium 8.9 - 10.3 mg/dL 9.6  Total Protein 6.5 - 8.1 g/dL 8.1  Total Bilirubin 0.3 - 1.2 mg/dL 0.6  Alkaline Phos 38 - 126 U/L 71  AST 15 - 41 U/L 20  ALT 0 - 44 U/L 20   CBC Latest Ref Rng & Units 12/15/2018  WBC 3.4 - 10.8 x10E3/uL 4.9  Hemoglobin 13.0 - 17.7 g/dL 11.6(L)  Hematocrit 37.5 - 51.0 % 34.8(L)  Platelets 150 - 450 x10E3/uL 133(L)     Assessment and plan- Patient is a 43 y.o. male with history of provoked pulmonary embolism in July 2020 currently on Eliquis here to discuss hypercoagulable work-up results  Patient has an extensive hypercoagulable work-up including protein C, protein S, Antithrombin III, factor V Leiden and prothrombin gene mutation as well as antiphospholipid antibody syndrome testing which was essentially unremarkable.  Patient's first episode of pulmonary embolism in July  2020 was likely provoked given that he was hospitalized for pneumonia and rhabdomyolysis at that time.  I recommended he should complete 6 months of anticoagulation which will end sometime in February 2021 and following that he can stop taking his Eliquis.  He does have some risk factors for recurrence including male sex, obesity and first episode of VTE.  I have encouraged patient to work on his weight loss.  Patient verbalized understanding  I will see him back in 6 months   Visit Diagnosis 1. Multiple subsegmental pulmonary emboli without acute cor pulmonale (HCC)      Dr. Owens Shark, MD, MPH [facility] at [facility] [phone] 03/15/2019 2:27 PM

## 2019-03-21 ENCOUNTER — Encounter: Payer: Self-pay | Admitting: Adult Health

## 2019-03-21 ENCOUNTER — Other Ambulatory Visit: Payer: Self-pay

## 2019-03-21 ENCOUNTER — Ambulatory Visit: Payer: BC Managed Care – PPO | Admitting: Adult Health

## 2019-03-21 ENCOUNTER — Other Ambulatory Visit: Payer: Self-pay | Admitting: Physician Assistant

## 2019-03-21 ENCOUNTER — Telehealth: Payer: Self-pay | Admitting: Family Medicine

## 2019-03-21 VITALS — BP 114/92 | HR 100 | Temp 97.1°F | Resp 18 | Wt 317.4 lb

## 2019-03-21 DIAGNOSIS — E119 Type 2 diabetes mellitus without complications: Secondary | ICD-10-CM

## 2019-03-21 DIAGNOSIS — I10 Essential (primary) hypertension: Secondary | ICD-10-CM | POA: Diagnosis not present

## 2019-03-21 MED ORDER — LOSARTAN POTASSIUM 50 MG PO TABS: 50.0000 mg | ORAL_TABLET | Freq: Every day | ORAL | 0 refills | Status: DC

## 2019-03-21 MED ORDER — TRULICITY 1.5 MG/0.5ML ~~LOC~~ SOAJ: 1.5000 mg | SUBCUTANEOUS | 2 refills | Status: DC

## 2019-03-21 MED ORDER — HYDROCHLOROTHIAZIDE 25 MG PO TABS: 25.0000 mg | ORAL_TABLET | Freq: Every day | ORAL | 0 refills | Status: DC

## 2019-03-21 NOTE — Progress Notes (Signed)
Patient: [name] Walker Male    DOB: 10/03/1975   43 y.o.   MRN: [id number] Visit Date: 03/21/2019  Today's Provider: Marcille Buffy, FNP   Chief Complaint  Patient presents with  . Hypertension   Subjective:     HPI  Hypertension, follow-up:  BP Readings from Last 3 Encounters:  03/21/19 (!) 114/92  03/14/19 (!) 158/95  02/21/19 (!) 145/92    He was last seen for hypertension 14 months ago,patient states today he was seen to our office from [facility] with a blood pressure reading of 180/113.  BP at that visit was 138/82. Management changes since that visit include non, condition controlled on current medication. He reports excellent compliance with treatment. He is not having side effects.  He is not exercising. He is not adherent to low salt diet.   Outside blood pressures are not being checked regularly. Patient states average b/p at home 120/80 He is experiencing none.  Patient denies chest pain, chest pressure/discomfort, claudication, dyspnea, exertional chest pressure/discomfort, fatigue, irregular heart beat, lower extremity edema, near-syncope, orthopnea, palpitations, paroxysmal nocturnal dyspnea, syncope and tachypnea.   Cardiovascular risk factors include diabetes mellitus, hypertension, male gender, obesity (BMI >= 30 kg/m2) and sedentary lifestyle.  Use of agents associated with hypertension: none.    He has arthritic pain at orthipedics. Saw Whitney Meeler today. Denies new or changing back pain. Denies any abdominal pain.  Weight trend: increasing steadily  Patient  denies any fever, body aches,chills, rash, chest pain, shortness of breath, nausea, vomiting, or diarrhea.   He reports that he needs a refill of his hydrochlorothiazide.  He reports that he is taking his medications daily as prescribed, he has not missed any medications.  Wt Readings from Last 3 Encounters:  03/21/19 (!) 317 lb 6.4 oz (144 kg)  03/14/19 (!) 315 lb (142.9  kg)  02/21/19 (!) 304 lb (137.9 kg)    Current diet: not asked  ------------------------------------------------------------------------  Allergies  Allergen Reactions  . Ace Inhibitors Cough  . Amlodipine     Pedal edema  . Daptomycin Other (See Comments)    Makes CK levels go very high  . Metformin And Related     diarrhea  . Other     Seasonal allergies      Current Outpatient Medications:  .  apixaban (ELIQUIS) 5 MG TABS tablet, Take 1 tablet (5 mg total) by mouth 2 (two) times daily., Disp: 60 tablet, Rfl: 2 .  atorvastatin (LIPITOR) 80 MG tablet, Take 1 tablet (80 mg total) by mouth daily., Disp: 90 tablet, Rfl: 3 .  Dulaglutide (TRULICITY) 1.5 XB/1.4NW SOPN, Inject 1.5 mg into the skin once a week., Disp: 2 mL, Rfl: 2 .  gabapentin (NEURONTIN) 600 MG tablet, TAKE 1 TABLET BY MOUTH THREE TIMES DAILY, Disp: 270 tablet, Rfl: 0 .  hydrochlorothiazide (HYDRODIURIL) 25 MG tablet, Take 25 mg by mouth daily. , Disp: , Rfl:  .  losartan (COZAAR) 25 MG tablet, Take 1 tablet (25 mg total) by mouth daily., Disp: 90 tablet, Rfl: 0 .  metoprolol succinate (TOPROL-XL) 50 MG 24 hr tablet, TAKE 1 TABLET BY MOUTH ONCE DAILY (TAKE  WITH  OR  IMMEDIATELY  FOLLOWING  A  MEAL), Disp: 90 tablet, Rfl: 1 .  NON FORMULARY, CPAP everynight, Disp: , Rfl:  .  colchicine 0.6 MG tablet, Take 1.2 mg or two tablets at first. Then one hour later, take 0.6 mg. (Patient not taking: Reported on 02/21/2019),  Disp: 30 tablet, Rfl: 0  Review of Systems  Social History   Tobacco Use  . Smoking status: Former Smoker    Types: Cigarettes    Quit date: 2020    Years since quitting: 0.8  . Smokeless tobacco: Never Used  Substance Use Topics  . Alcohol use: Not Currently      Objective:   BP (!) 114/92   Pulse 100   Temp (!) 97.1 F (36.2 C) (Oral)   Resp 18   Wt (!) 317 lb 6.4 oz (144 kg)   BMI 46.87 kg/m  Vitals:   03/21/19 1053  BP: (!) 114/92  Pulse: 100  Resp: 18  Temp: (!) 97.1 F (36.2  C)  TempSrc: Oral  Weight: (!) 317 lb 6.4 oz (144 kg)  Body mass index is 46.87 kg/m.   Physical Exam Vitals signs reviewed.  Constitutional:      Appearance: Normal appearance.  HENT:     Head: Normocephalic and atraumatic.     Nose: Nose normal.     Mouth/Throat:     Mouth: Mucous membranes are moist.  Eyes:     Conjunctiva/sclera: Conjunctivae normal.     Pupils: Pupils are equal, round, and reactive to light.  Neck:     Musculoskeletal: Normal range of motion and neck supple. No neck rigidity or muscular tenderness.     Vascular: No carotid bruit.  Cardiovascular:     Rate and Rhythm: Normal rate and regular rhythm.     Pulses: Normal pulses.     Heart sounds: Normal heart sounds. No murmur. No friction rub. No gallop.   Pulmonary:     Effort: Pulmonary effort is normal. No respiratory distress.     Breath sounds: Normal breath sounds. No stridor. No wheezing, rhonchi or rales.  Chest:     Chest wall: No tenderness.  Abdominal:     Palpations: Abdomen is soft.  Musculoskeletal:     Right lower leg: No edema.     Left lower leg: No edema.  Lymphadenopathy:     Cervical: No cervical adenopathy.  Skin:    General: Skin is warm and dry.     Capillary Refill: Capillary refill takes less than 2 seconds.  Neurological:     General: No focal deficit present.     Mental Status: He is alert and oriented to person, place, and time.  Psychiatric:        Mood and Affect: Mood normal.        Behavior: Behavior normal.        Thought Content: Thought content normal.        Judgment: Judgment normal.      No results found for any visits on 03/21/19.     Assessment & Plan    1. Morbid obesity (HCC) Discussed weight loss, diet, exercise at least 30 minutes a day.  Presodium intake, and maintain a healthy heart diet.  2. Essential (primary) hypertension  Meds ordered this encounter  Medications  . losartan (COZAAR) 50 MG tablet    Sig: Take 1 tablet (50 mg total)  by mouth daily.    Dispense:  90 tablet    Refill:  0  . hydrochlorothiazide (HYDRODIURIL) 25 MG tablet    Sig: Take 1 tablet (25 mg total) by mouth daily.    Dispense:  90 tablet    Refill:  0   Medications Discontinued During This Encounter  Medication Reason  . losartan (COZAAR) 25 MG tablet Completed Course  .  hydrochlorothiazide (HYDRODIURIL) 25 MG tablet Reorder  Losartan 25 mg was discontinued.  Chlorothiazide 25 mg was refilled at patient request.  He does have a history of elevated creatinine, rechecking this today to be sure that he has been stable.  He is advised to monitor his blood pressures at home, and report any readings given the parameters that we discussed.  Patient should call the office if he has any concerns at any time, if after hours he should seek emergency medical attention or be seen in the urgent care. Return in about 2 weeks (around 04/04/2019), or if symptoms worsen or fail to improve, for at any time for any worsening symptoms, Go to Emergency room/ urgent care if worse.   Gust this current plan with Vella Kohler, MD, MPH is in agreement with plan.     The entirety of the information documented in the History of Present Illness, Review of Systems and Physical Exam were personally obtained by me. Portions of this information were initially documented by the  Certified Medical Assistant whose name is documented in Epic and reviewed by me for thoroughness and accuracy.  I have personally performed the exam and reviewed the chart and it is accurate to the best of my knowledge.  Museum/gallery conservator has been used and any errors in dictation or transcription are unintentional.  Eula Fried. Flinchum FNP-C  [facility]   Jairo Ben, FNP  [facility]

## 2019-03-21 NOTE — Telephone Encounter (Signed)
Medication Refill - Medication: Dulaglutide (TRULICITY) 1.5 FX/8.3AN SOPN Pt stated he is out of Trulicity and forgot to call earlier to refill.   Pt also using new pharmacy as well.  Has the patient contacted their pharmacy? Yes.   (Agent: If no, request that the patient contact the pharmacy for the refill.) (Agent: If yes, when and what did the pharmacy advise?)  Preferred Pharmacy (with phone number or street name):  [id number] - [place], [place] - [address] [phone] (Phone) [phone] (Fax)    Agent: Please be advised that RX refills may take up to 3 business days. We ask that you follow-up with your pharmacy.

## 2019-03-21 NOTE — Telephone Encounter (Signed)
Whitney Meeler with [facility] calling to report BP 186/113, 183/117 in her office this AM on arrival and manual recheck. He is asymptomatic and denying any chest pain, shortness of breath, headache, vision changes, lower extremity edema.  Advised her not to send him to the emergency department for asymptomatic blood pressure elevation, but to ensure that he has taken his medication this morning and if he has not to go ahead and take it.  We will also see him in our office this morning Sharyn Lull NP) and check him out and instruct him.  He may need increase in his antihypertensives.  Appointment was scheduled for 11/23 at 10:40 AM with Sharyn Lull

## 2019-03-21 NOTE — Patient Instructions (Addendum)
Meds ordered this encounter  Medications  . losartan (COZAAR) 50 MG tablet    Sig: Take 1 tablet (50 mg total) by mouth daily.    Dispense:  90 tablet    Refill:  0  . hydrochlorothiazide (HYDRODIURIL) 25 MG tablet    Sig: Take 1 tablet (25 mg total) by mouth daily.    Dispense:  90 tablet    Refill:  0   I refilled the above medications and your Losartan 50 mg tablet by mouth daily. Monitor your blood pressures. Report any lowe reading.   Stop the below medication.  Medications Discontinued During This Encounter  Medication Reason  . losartan (COZAAR) 25 MG tablet Completed Course   Losartan tablets What is this medicine? LOSARTAN (loe SAR tan) is used to treat high blood pressure and to reduce the risk of stroke in certain patients. This drug also slows the progression of kidney disease in patients with diabetes. This medicine may be used for other purposes; ask your health care provider or pharmacist if you have questions. COMMON BRAND NAME(S): Cozaar What should I tell my health care provider before I take this medicine? They need to know if you have any of these conditions:  heart failure  kidney or liver disease  an unusual or allergic reaction to losartan, other medicines, foods, dyes, or preservatives  pregnant or trying to get pregnant  breast-feeding How should I use this medicine? Take this medicine by mouth with a glass of water. Follow the directions on the prescription label. This medicine can be taken with or without food. Take your doses at regular intervals. Do not take your medicine more often than directed. Talk to your pediatrician regarding the use of this medicine in children. Special care may be needed. Overdosage: If you think you have taken too much of this medicine contact a poison control center or emergency room at once. NOTE: This medicine is only for you. Do not share this medicine with others. What if I miss a dose? If you miss a dose, take it  as soon as you can. If it is almost time for your next dose, take only that dose. Do not take double or extra doses. What may interact with this medicine?  blood pressure medicines  diuretics, especially triamterene, spironolactone, or amiloride  fluconazole  NSAIDs, medicines for pain and inflammation, like ibuprofen or naproxen  potassium salts or potassium supplements  rifampin This list may not describe all possible interactions. Give your health care provider a list of all the medicines, herbs, non-prescription drugs, or dietary supplements you use. Also tell them if you smoke, drink alcohol, or use illegal drugs. Some items may interact with your medicine. What should I watch for while using this medicine? Visit your doctor or health care professional for regular checks on your progress. Check your blood pressure as directed. Ask your doctor or health care professional what your blood pressure should be and when you should contact him or her. Call your doctor or health care professional if you notice an irregular or fast heart beat. Women should inform their doctor if they wish to become pregnant or think they might be pregnant. There is a potential for serious side effects to an unborn child, particularly in the second or third trimester. Talk to your health care professional or pharmacist for more information. You may get drowsy or dizzy. Do not drive, use machinery, or do anything that needs mental alertness until you know how this drug affects  you. Do not stand or sit up quickly, especially if you are an older patient. This reduces the risk of dizzy or fainting spells. Alcohol can make you more drowsy and dizzy. Avoid alcoholic drinks. Avoid salt substitutes unless you are told otherwise by your doctor or health care professional. Do not treat yourself for coughs, colds, or pain while you are taking this medicine without asking your doctor or health care professional for advice. Some  ingredients may increase your blood pressure. What side effects may I notice from receiving this medicine? Side effects that you should report to your doctor or health care professional as soon as possible:  confusion, dizziness, light headedness or fainting spells  decreased amount of urine passed  difficulty breathing or swallowing, hoarseness, or tightening of the throat  fast or irregular heart beat, palpitations, or chest pain  skin rash, itching  swelling of your face, lips, tongue, hands, or feet Side effects that usually do not require medical attention (report to your doctor or health care professional if they continue or are bothersome):  cough  decreased sexual function or desire  headache  nasal congestion or stuffiness  nausea or stomach pain  sore or cramping muscles This list may not describe all possible side effects. Call your doctor for medical advice about side effects. You may report side effects to FDA at 1-800-FDA-1088. Where should I keep my medicine? Keep out of the reach of children. Store at room temperature between 15 and 30 degrees C (59 and 86 degrees F). Protect from light. Keep container tightly closed. Throw away any unused medicine after the expiration date. NOTE: This sheet is a summary. It may not cover all possible information. If you have questions about this medicine, talk to your doctor, pharmacist, or health care provider.  2020 Elsevier/Gold Standard (2007-06-25 16:42:18)   Blood Pressure Record Sheet To take your blood pressure, you will need a blood pressure machine. You can buy a blood pressure machine (blood pressure monitor) at your clinic, drug store, or online. When choosing one, consider:  An automatic monitor that has an arm cuff.  A cuff that wraps snugly around your upper arm. You should be able to fit only one finger between your arm and the cuff.  A device that stores blood pressure reading results.  Do not choose a  monitor that measures your blood pressure from your wrist or finger. Follow your health care provider's instructions for how to take your blood pressure. To use this form:  Get one reading in the morning (a.m.) before you take any medicines.  Get one reading in the evening (p.m.) before supper.  Take at least 2 readings with each blood pressure check. This makes sure the results are correct. Wait 1-2 minutes between measurements.  Write down the results in the spaces on this form.  Repeat this once a week, or as told by your health care provider.  Make a follow-up appointment with your health care provider to discuss the results. Blood pressure log Date: _______________________  a.m. _____________________(1st reading) _____________________(2nd reading)  p.m. _____________________(1st reading) _____________________(2nd reading) Date: _______________________  a.m. _____________________(1st reading) _____________________(2nd reading)  p.m. _____________________(1st reading) _____________________(2nd reading) Date: _______________________  a.m. _____________________(1st reading) _____________________(2nd reading)  p.m. _____________________(1st reading) _____________________(2nd reading) Date: _______________________  a.m. _____________________(1st reading) _____________________(2nd reading)  p.m. _____________________(1st reading) _____________________(2nd reading) Date: _______________________  a.m. _____________________(1st reading) _____________________(2nd reading)  p.m. _____________________(1st reading) _____________________(2nd reading) This information is not intended to replace advice given to you by your  health care provider. Make sure you discuss any questions you have with your health care provider. Document Released: 01/11/2003 Document Revised: 06/12/2017 Document Reviewed: 04/14/2017 Elsevier Patient Education  2020 [employer].  Losartan tablets What is  this medicine? LOSARTAN (loe SAR tan) is used to treat high blood pressure and to reduce the risk of stroke in certain patients. This drug also slows the progression of kidney disease in patients with diabetes. This medicine may be used for other purposes; ask your health care provider or pharmacist if you have questions. COMMON BRAND NAME(S): Cozaar What should I tell my health care provider before I take this medicine? They need to know if you have any of these conditions:  heart failure  kidney or liver disease  an unusual or allergic reaction to losartan, other medicines, foods, dyes, or preservatives  pregnant or trying to get pregnant  breast-feeding How should I use this medicine? Take this medicine by mouth with a glass of water. Follow the directions on the prescription label. This medicine can be taken with or without food. Take your doses at regular intervals. Do not take your medicine more often than directed. Talk to your pediatrician regarding the use of this medicine in children. Special care may be needed. Overdosage: If you think you have taken too much of this medicine contact a poison control center or emergency room at once. NOTE: This medicine is only for you. Do not share this medicine with others. What if I miss a dose? If you miss a dose, take it as soon as you can. If it is almost time for your next dose, take only that dose. Do not take double or extra doses. What may interact with this medicine?  blood pressure medicines  diuretics, especially triamterene, spironolactone, or amiloride  fluconazole  NSAIDs, medicines for pain and inflammation, like ibuprofen or naproxen  potassium salts or potassium supplements  rifampin This list may not describe all possible interactions. Give your health care provider a list of all the medicines, herbs, non-prescription drugs, or dietary supplements you use. Also tell them if you smoke, drink alcohol, or use illegal  drugs. Some items may interact with your medicine. What should I watch for while using this medicine? Visit your doctor or health care professional for regular checks on your progress. Check your blood pressure as directed. Ask your doctor or health care professional what your blood pressure should be and when you should contact him or her. Call your doctor or health care professional if you notice an irregular or fast heart beat. Women should inform their doctor if they wish to become pregnant or think they might be pregnant. There is a potential for serious side effects to an unborn child, particularly in the second or third trimester. Talk to your health care professional or pharmacist for more information. You may get drowsy or dizzy. Do not drive, use machinery, or do anything that needs mental alertness until you know how this drug affects you. Do not stand or sit up quickly, especially if you are an older patient. This reduces the risk of dizzy or fainting spells. Alcohol can make you more drowsy and dizzy. Avoid alcoholic drinks. Avoid salt substitutes unless you are told otherwise by your doctor or health care professional. Do not treat yourself for coughs, colds, or pain while you are taking this medicine without asking your doctor or health care professional for advice. Some ingredients may increase your blood pressure. What side effects may I notice  from receiving this medicine? Side effects that you should report to your doctor or health care professional as soon as possible:  confusion, dizziness, light headedness or fainting spells  decreased amount of urine passed  difficulty breathing or swallowing, hoarseness, or tightening of the throat  fast or irregular heart beat, palpitations, or chest pain  skin rash, itching  swelling of your face, lips, tongue, hands, or feet Side effects that usually do not require medical attention (report to your doctor or health care professional if  they continue or are bothersome):  cough  decreased sexual function or desire  headache  nasal congestion or stuffiness  nausea or stomach pain  sore or cramping muscles This list may not describe all possible side effects. Call your doctor for medical advice about side effects. You may report side effects to FDA at 1-800-FDA-1088. Where should I keep my medicine? Keep out of the reach of children. Store at room temperature between 15 and 30 degrees C (59 and 86 degrees F). Protect from light. Keep container tightly closed. Throw away any unused medicine after the expiration date. NOTE: This sheet is a summary. It may not cover all possible information. If you have questions about this medicine, talk to your doctor, pharmacist, or health care provider.  2020 Elsevier/Gold Standard (2007-06-25 16:42:18)

## 2019-03-22 LAB — COMPREHENSIVE METABOLIC PANEL
ALT: 22 IU/L (ref 0–44)
AST: 23 IU/L (ref 0–40)
Albumin/Globulin Ratio: 1.3 (ref 1.2–2.2)
Albumin: 4.1 g/dL (ref 4.0–5.0)
Alkaline Phosphatase: 84 IU/L (ref 39–117)
BUN/Creatinine Ratio: 16 (ref 9–20)
BUN: 23 mg/dL (ref 6–24)
Bilirubin Total: 0.3 mg/dL (ref 0.0–1.2)
CO2: 27 mmol/L (ref 20–29)
Calcium: 10 mg/dL (ref 8.7–10.2)
Chloride: 99 mmol/L (ref 96–106)
Creatinine, Ser: 1.45 mg/dL — ABNORMAL HIGH (ref 0.76–1.27)
GFR calc Af Amer: 69 mL/min/{1.73_m2} (ref >59)
GFR calc non Af Amer: 59 mL/min/{1.73_m2} — ABNORMAL LOW (ref >59)
Globulin, Total: 3.1 g/dL (ref 1.5–4.5)
Glucose: 86 mg/dL (ref 65–99)
Potassium: 3.9 mmol/L (ref 3.5–5.2)
Sodium: 142 mmol/L (ref 134–144)
Total Protein: 7.2 g/dL (ref 6.0–8.5)

## 2019-04-04 ENCOUNTER — Other Ambulatory Visit: Payer: Self-pay | Admitting: Physician Assistant

## 2019-04-04 DIAGNOSIS — E782 Mixed hyperlipidemia: Secondary | ICD-10-CM

## 2019-04-04 MED ORDER — ATORVASTATIN CALCIUM 80 MG PO TABS: 80.0000 mg | ORAL_TABLET | Freq: Every day | ORAL | 1 refills | Status: DC

## 2019-04-04 NOTE — Telephone Encounter (Signed)
L.O.V. was on 03/21/2019 and medication send into pharmacy.

## 2019-04-04 NOTE — Telephone Encounter (Signed)
[facility] faxed refill request for the following medications:  atorvastatin (LIPITOR) 80 MG tablet   Please advise.  

## 2019-04-11 ENCOUNTER — Ambulatory Visit: Payer: BC Managed Care – PPO | Admitting: Physician Assistant

## 2019-04-11 ENCOUNTER — Other Ambulatory Visit: Payer: Self-pay

## 2019-04-11 VITALS — BP 148/82 | HR 78 | Temp 97.5°F | Wt 321.0 lb

## 2019-04-11 DIAGNOSIS — R21 Rash and other nonspecific skin eruption: Secondary | ICD-10-CM | POA: Diagnosis not present

## 2019-04-11 DIAGNOSIS — E782 Mixed hyperlipidemia: Secondary | ICD-10-CM | POA: Diagnosis not present

## 2019-04-11 DIAGNOSIS — I10 Essential (primary) hypertension: Secondary | ICD-10-CM

## 2019-04-11 DIAGNOSIS — E1121 Type 2 diabetes mellitus with diabetic nephropathy: Secondary | ICD-10-CM

## 2019-04-11 MED ORDER — TRIAMCINOLONE ACETONIDE 0.1 % EX CREA: 1.0000 "application " | TOPICAL_CREAM | Freq: Two times a day (BID) | CUTANEOUS | 0 refills | Status: DC

## 2019-04-11 NOTE — Progress Notes (Signed)
[name] Walker  MRN: [id number] DOB: [birth year]  Subjective:  HPI  The patient is a 43 year old male who presents for follow up of chronic health issues. Third child due in January.   Diabetes-The patient does not check his glucose regularly at home but on occasion he does and it ranges 112-120.  He denies any symptoms suggestive of hypoglycemic.  His last A1C was on 01/17/19 and was 6.5  HTN: Currently taking losartan 50 mg daily. Currently also taking metoprolol succinate 50 mg daily and also HCTZ 25 mg daily. Patient presented to this clinic on 03/21/2019 with elevated BP and had losartan increased to 50 mg QD. He had a recent AKI s/p hospitalization for septic arthritis. He continues to be followed by nephrology and has appointment with Dr. Candiss Norse on 04/18/2019.   BP Readings from Last 3 Encounters:  04/11/19 (!) 148/82  03/21/19 (!) 114/92  03/14/19 (!) 158/95   DM II Currently taking trulicity 1.5 mg once weekly subQ  Lab Results  Component Value Date   HGBA1C 6.5 (H) 01/17/2019   HLD: Currently taking lipitor 80 mg without issues   Lipid Panel     Component Value Date/Time   CHOL 107 12/15/2018 1048   TRIG 72 12/15/2018 1048   HDL 31 (L) 12/15/2018 1048   CHOLHDL 3.5 12/15/2018 1048   LDLCALC 62 12/15/2018 1048   LABVLDL 14 12/15/2018 1048   Morbid Obesity:   Wt Readings from Last 3 Encounters:  04/11/19 (!) 321 lb (145.6 kg)  03/21/19 (!) 317 lb 6.4 oz (144 kg)  03/14/19 (!) 315 lb (142.9 kg)   Patient reports rash x several weeks. Reported the rash appeared suddenly. Not itchy. Noticed by chiropractor who noticed scattered bumps on back. Reports he feels the bumps are now less prominent.    Patient Active Problem List   Diagnosis Date Noted  . BMI 45.0-49.9, adult ([name] Walker) 02/14/2019  . Tobacco use disorder 11/10/2018  . Pyogenic arthritis of left knee joint ([name] Walker) 10/30/2018  . Disease characterized by destruction of skeletal muscle 10/29/2018  . Cellulitis and  abscess of left lower extremity 10/26/2018  . Morbid obesity ([name] Walker) 09/10/2018  . History of gout 03/11/2018  . Hip pain 08/18/2016  . Lumbar radiculopathy 08/18/2016  . Chronic bilateral low back pain without sciatica 08/05/2016  . Acute anxiety 08/05/2016  . Essential (primary) hypertension 10/31/2015  . Diabetes mellitus with nephropathy ([name] Walker) 10/31/2015  . HLD (hyperlipidemia) 10/31/2015  . OSA (obstructive sleep apnea) 10/31/2015  . Exomphalos 10/31/2015    Past Medical History:  Diagnosis Date  . Hypertension   . pre diabetes    hgb A!C 6.0  . Pulmonary embolism ([name] Walker)   . Sleep apnea     Social History   Socioeconomic History  . Marital status: Married    Spouse name: Not on file  . Number of children: Not on file  . Years of education: Not on file  . Highest education level: Not on file  Occupational History  . Occupation: [name] Walker  Tobacco Use  . Smoking status: Former Smoker    Types: Cigarettes    Quit date: 2020    Years since quitting: 0.9  . Smokeless tobacco: Never Used  Substance and Sexual Activity  . Alcohol use: Not Currently  . Drug use: Never  . Sexual activity: Yes    Birth control/protection: None  Other Topics Concern  . Not on file  Social History Narrative  . Not on file  Social Determinants of Health   Financial Resource Strain:   . Difficulty of Paying Living Expenses: Not on file  Food Insecurity:   . Worried About Programme researcher, broadcasting/film/videounning Out of Food in the Last Year: Not on file  . Ran Out of Food in the Last Year: Not on file  Transportation Needs:   . Lack of Transportation (Medical): Not on file  . Lack of Transportation (Non-Medical): Not on file  Physical Activity: Insufficiently Active  . Days of Exercise per Week: 1 day  . Minutes of Exercise per Session: 10 min  Stress: No Stress Concern Present  . Feeling of Stress : Only a little  Social Connections: Slightly Isolated  . Frequency of Communication with Friends and Family: Twice a  week  . Frequency of Social Gatherings with Friends and Family: Twice a week  . Attends Religious Services: 1 to 4 times per year  . Active Member of Clubs or Organizations: No  . Attends BankerClub or Organization Meetings: Never  . Marital Status: Married  Catering managerntimate Partner Violence: Not At Risk  . Fear of Current or Ex-Partner: No  . Emotionally Abused: No  . Physically Abused: No  . Sexually Abused: No    Outpatient Encounter Medications as of 04/11/2019  Medication Sig Note  . apixaban (ELIQUIS) 5 MG TABS tablet Take 1 tablet (5 mg total) by mouth 2 (two) times daily.   Marland Kitchen. atorvastatin (LIPITOR) 80 MG tablet Take 1 tablet (80 mg total) by mouth daily.   . Dulaglutide (TRULICITY) 1.5 MG/0.5ML SOPN Inject 1.5 mg into the skin once a week.   . gabapentin (NEURONTIN) 600 MG tablet TAKE 1 TABLET BY MOUTH THREE TIMES DAILY   . hydrochlorothiazide (HYDRODIURIL) 25 MG tablet Take 1 tablet (25 mg total) by mouth daily.   Marland Kitchen. losartan (COZAAR) 50 MG tablet Take 1 tablet (50 mg total) by mouth daily.   . metoprolol succinate (TOPROL-XL) 50 MG 24 hr tablet TAKE 1 TABLET BY MOUTH ONCE DAILY (TAKE  WITH  OR  IMMEDIATELY  FOLLOWING  A  MEAL)   . NON FORMULARY CPAP everynight 12/30/2017: PRN  . colchicine 0.6 MG tablet Take 1.2 mg or two tablets at first. Then one hour later, take 0.6 mg. (Patient not taking: Reported on 02/21/2019)   . triamcinolone cream (KENALOG) 0.1 % Apply 1 application topically 2 (two) times daily.    No facility-administered encounter medications on file as of 04/11/2019.    Allergies  Allergen Reactions  . Ace Inhibitors Cough  . Amlodipine     Pedal edema  . Daptomycin Other (See Comments)    Makes CK levels go very high  . Metformin And Related     diarrhea  . Other     Seasonal allergies     Review of Systems  Constitutional: Negative for chills, diaphoresis, fever and malaise/fatigue.  HENT: Negative for congestion, ear pain, sinus pain and sore throat.     Respiratory: Negative for cough and shortness of breath.   Cardiovascular: Negative for chest pain.  Gastrointestinal: Negative for abdominal pain and diarrhea.  Genitourinary: Negative for frequency.  Musculoskeletal: Negative for myalgias.  Neurological: Negative for headaches.  Endo/Heme/Allergies: Negative for polydipsia.    Objective:  BP (!) 148/82 (BP Location: Right Arm, Patient Position: Sitting, Cuff Size: Large)   Pulse 78   Temp (!) 97.5 F (36.4 C) (Skin)   Wt (!) 321 lb (145.6 kg)   SpO2 94%   BMI 47.40 kg/m   Physical Exam  Constitutional: He is well-developed, well-nourished, and in no distress.  Cardiovascular: Normal rate and regular rhythm.  Pulmonary/Chest: Effort normal and breath sounds normal.  Skin: Skin is warm and dry. Rash noted.  Scattered papules on chest and lumbar back region. No pustules, blisters. Not in dermatomal distribution. Lesions are in various states of healing.   Psychiatric: Affect and judgment normal.    Assessment and Plan :  1. Essential (primary) hypertension  Uncontrolled. I would like to see his kidney function after increasing the losartan. Would like to keep tighter control of BP but need to monitor kidney function due to recent AKI. F/u 3 months for chronic.   - Basic Metabolic Panel (BMET)  2. Diabetes mellitus with nephropathy (HCC)  A1c today, last controlled. He has gained four pounds since last visit. Counseled on preventing weight gain.   - Hemoglobin A1c  3. Mixed hyperlipidemia  Continue lipitor.   4. Rash  Possibly contact dermatitis. Cream sent in as below.   - triamcinolone cream (KENALOG) 0.1 %; Apply 1 application topically 2 (two) times daily.  Dispense: 30 g; Refill: 0  The entirety of the information documented in the History of Present Illness, Review of Systems and Physical Exam were personally obtained by me. Portions of this information were initially documented by [facility] and reviewed by  me for thoroughness and accuracy.

## 2019-04-11 NOTE — Patient Instructions (Signed)
Diabetes Mellitus and Exercise Exercising regularly is important for your overall health, especially when you have diabetes (diabetes mellitus). Exercising is not only about losing weight. It has many other health benefits, such as increasing muscle strength and bone density and reducing body fat and stress. This leads to improved fitness, flexibility, and endurance, all of which result in better overall health. Exercise has additional benefits for people with diabetes, including:  Reducing appetite.  Helping to lower and control blood glucose.  Lowering blood pressure.  Helping to control amounts of fatty substances (lipids) in the blood, such as cholesterol and triglycerides.  Helping the body to respond better to insulin (improving insulin sensitivity).  Reducing how much insulin the body needs.  Decreasing the risk for heart disease by: ? Lowering cholesterol and triglyceride levels. ? Increasing the levels of good cholesterol. ? Lowering blood glucose levels. What is my activity plan? Your health care provider or certified diabetes educator can help you make a plan for the type and frequency of exercise (activity plan) that works for you. Make sure that you:  Do at least 150 minutes of moderate-intensity or vigorous-intensity exercise each week. This could be brisk walking, biking, or water aerobics. ? Do stretching and strength exercises, such as yoga or weightlifting, at least 2 times a week. ? Spread out your activity over at least 3 days of the week.  Get some form of physical activity every day. ? Do not go more than 2 days in a row without some kind of physical activity. ? Avoid being inactive for more than 30 minutes at a time. Take frequent breaks to walk or stretch.  Choose a type of exercise or activity that you enjoy, and set realistic goals.  Start slowly, and gradually increase the intensity of your exercise over time. What do I need to know about managing my  diabetes?   Check your blood glucose before and after exercising. ? If your blood glucose is 240 mg/dL (13.3 mmol/L) or higher before you exercise, check your urine for ketones. If you have ketones in your urine, do not exercise until your blood glucose returns to normal. ? If your blood glucose is 100 mg/dL (5.6 mmol/L) or lower, eat a snack containing 15-20 grams of carbohydrate. Check your blood glucose 15 minutes after the snack to make sure that your level is above 100 mg/dL (5.6 mmol/L) before you start your exercise.  Know the symptoms of low blood glucose (hypoglycemia) and how to treat it. Your risk for hypoglycemia increases during and after exercise. Common symptoms of hypoglycemia can include: ? Hunger. ? Anxiety. ? Sweating and feeling clammy. ? Confusion. ? Dizziness or feeling light-headed. ? Increased heart rate or palpitations. ? Blurry vision. ? Tingling or numbness around the mouth, lips, or tongue. ? Tremors or shakes. ? Irritability.  Keep a rapid-acting carbohydrate snack available before, during, and after exercise to help prevent or treat hypoglycemia.  Avoid injecting insulin into areas of the body that are going to be exercised. For example, avoid injecting insulin into: ? The arms, when playing tennis. ? The legs, when jogging.  Keep records of your exercise habits. Doing this can help you and your health care provider adjust your diabetes management plan as needed. Write down: ? Food that you eat before and after you exercise. ? Blood glucose levels before and after you exercise. ? The type and amount of exercise you have done. ? When your insulin is expected to peak, if you use   insulin. Avoid exercising at times when your insulin is peaking.  When you start a new exercise or activity, work with your health care provider to make sure the activity is safe for you, and to adjust your insulin, medicines, or food intake as needed.  Drink plenty of water while  you exercise to prevent dehydration or heat stroke. Drink enough fluid to keep your urine clear or pale yellow. Summary  Exercising regularly is important for your overall health, especially when you have diabetes (diabetes mellitus).  Exercising has many health benefits, such as increasing muscle strength and bone density and reducing body fat and stress.  Your health care provider or certified diabetes educator can help you make a plan for the type and frequency of exercise (activity plan) that works for you.  When you start a new exercise or activity, work with your health care provider to make sure the activity is safe for you, and to adjust your insulin, medicines, or food intake as needed. This information is not intended to replace advice given to you by your health care provider. Make sure you discuss any questions you have with your health care provider. Document Released: 07/05/2003 Document Revised: 11/06/2016 Document Reviewed: 09/24/2015 Elsevier Patient Education  2020 [employer] Inc.  

## 2019-04-12 LAB — BASIC METABOLIC PANEL
BUN/Creatinine Ratio: 17 (ref 9–20)
BUN: 26 mg/dL — ABNORMAL HIGH (ref 6–24)
CO2: 27 mmol/L (ref 20–29)
Calcium: 9.9 mg/dL (ref 8.7–10.2)
Chloride: 101 mmol/L (ref 96–106)
Creatinine, Ser: 1.52 mg/dL — ABNORMAL HIGH (ref 0.76–1.27)
GFR calc Af Amer: 65 mL/min/{1.73_m2} (ref >59)
GFR calc non Af Amer: 56 mL/min/{1.73_m2} — ABNORMAL LOW (ref >59)
Glucose: 92 mg/dL (ref 65–99)
Potassium: 3.8 mmol/L (ref 3.5–5.2)
Sodium: 142 mmol/L (ref 134–144)

## 2019-04-12 LAB — HEMOGLOBIN A1C
Est. average glucose Bld gHb Est-mCnc: 131 mg/dL
Hgb A1c MFr Bld: 6.2 % — ABNORMAL HIGH (ref 4.8–5.6)

## 2019-05-02 ENCOUNTER — Other Ambulatory Visit: Payer: Self-pay | Admitting: Physician Assistant

## 2019-05-02 DIAGNOSIS — M5416 Radiculopathy, lumbar region: Secondary | ICD-10-CM

## 2019-05-02 MED ORDER — GABAPENTIN 600 MG PO TABS: 600.0000 mg | ORAL_TABLET | Freq: Three times a day (TID) | ORAL | 0 refills | Status: DC

## 2019-05-02 NOTE — Telephone Encounter (Signed)
L.O.V. was on 04/11/2019 and no upcoming appointment. Please advise.

## 2019-05-02 NOTE — Telephone Encounter (Signed)
[facility] faxed refill request for the following medications:  gabapentin (NEURONTIN) 600 MG tablet   Please advise.  

## 2019-05-04 ENCOUNTER — Other Ambulatory Visit: Payer: Self-pay | Admitting: Physician Assistant

## 2019-05-04 DIAGNOSIS — M5416 Radiculopathy, lumbar region: Secondary | ICD-10-CM

## 2019-05-04 NOTE — Telephone Encounter (Signed)
Copied from CRM [id number]. Topic: Quick Communication - Rx Refill/Question >> May 04, 2019  1:08 PM [place] Small, [place] A wrote: Medication: gabapentin (NEURONTIN) 600 MG tablet (Patient has a few pills remaining and would like medication sent to new pharmacy.)  Has the patient contacted their pharmacy? Yes (Agent: If no, request that the patient contact the pharmacy for the refill.) (Agent: If yes, when and what did the pharmacy advise? Contact PCP  Preferred Pharmacy (with phone number or street name): [id number] [place], [place] - [address]  Phone:  [phone] Fax:  [phone]     Agent: Please be advised that RX refills may take up to 3 business days. We ask that you follow-up with your pharmacy.

## 2019-05-04 NOTE — Telephone Encounter (Signed)
Spoke pharmacy and they have script and will be getting it ready

## 2019-05-09 ENCOUNTER — Ambulatory Visit: Payer: BC Managed Care – PPO | Attending: Internal Medicine

## 2019-05-09 DIAGNOSIS — Z20822 Contact with and (suspected) exposure to covid-19: Secondary | ICD-10-CM

## 2019-05-10 LAB — NOVEL CORONAVIRUS, NAA: SARS-CoV-2, NAA: NOT DETECTED

## 2019-05-21 ENCOUNTER — Other Ambulatory Visit: Payer: Self-pay | Admitting: Oncology

## 2019-05-21 DIAGNOSIS — I2699 Other pulmonary embolism without acute cor pulmonale: Secondary | ICD-10-CM

## 2019-06-17 ENCOUNTER — Encounter: Payer: Self-pay | Admitting: Physician Assistant

## 2019-06-17 NOTE — Telephone Encounter (Signed)
Appointment schedule for Monday,06/20/2019.

## 2019-06-20 ENCOUNTER — Ambulatory Visit (INDEPENDENT_AMBULATORY_CARE_PROVIDER_SITE_OTHER): Payer: BC Managed Care – PPO | Admitting: Physician Assistant

## 2019-06-20 ENCOUNTER — Other Ambulatory Visit: Payer: Self-pay | Admitting: Physician Assistant

## 2019-06-20 DIAGNOSIS — N529 Male erectile dysfunction, unspecified: Secondary | ICD-10-CM | POA: Diagnosis not present

## 2019-06-20 DIAGNOSIS — E119 Type 2 diabetes mellitus without complications: Secondary | ICD-10-CM

## 2019-06-20 MED ORDER — SILDENAFIL CITRATE 50 MG PO TABS: 50.0000 mg | ORAL_TABLET | Freq: Every day | ORAL | 0 refills | Status: DC | PRN

## 2019-06-20 NOTE — Progress Notes (Signed)
Patient: [name] Walker Male    DOB: [birth year]   44 y.o.   MRN: [id number] Visit Date: 06/20/2019  Today's Provider: Trey Sailors, PA-C   Chief Complaint  Patient presents with  . Follow-up   Subjective:    I, Porsha McClurkin,CMA am acting as a Neurosurgeon for [place].  Virtual Visit via Telephone Note  I connected with [name] A Neeson on 06/20/19 at  1:40 PM EST by telephone and verified that I am speaking with the correct person using two identifiers.  Location: Patient: Home Provider: Office   I discussed the limitations, risks, security and privacy concerns of performing an evaluation and management service by telephone and the availability of in person appointments. I also discussed with the patient that there may be a patient responsible charge related to this service. The patient expressed understanding and agreed to proceed.  HPI  Follow-up Patient presents today via virtual visit to request medication for erectile dysfunction. He is able to get an erection at times. He reports he is not able to get a full erection for about two months. He has never had this issue before. He reports normal stress. He is smoking three cigarettes per week. Alcoholic drinks: off and on, socially throughout the week.    Allergies  Allergen Reactions  . Ace Inhibitors Cough  . Amlodipine     Pedal edema  . Daptomycin Other (See Comments)    Makes CK levels go very high  . Metformin And Related     diarrhea  . Other     Seasonal allergies      Current Outpatient Medications:  .  atorvastatin (LIPITOR) 80 MG tablet, Take 1 tablet (80 mg total) by mouth daily., Disp: 90 tablet, Rfl: 1 .  ELIQUIS 5 MG TABS tablet, TAKE 1 TABLET BY MOUTH TWICE A DAY, Disp: 60 tablet, Rfl: 2 .  gabapentin (NEURONTIN) 600 MG tablet, Take 1 tablet (600 mg total) by mouth 3 (three) times daily., Disp: 270 tablet, Rfl: 0 .  hydrochlorothiazide (HYDRODIURIL) 25 MG tablet, Take 1 tablet (25 mg  total) by mouth daily., Disp: 90 tablet, Rfl: 0 .  losartan (COZAAR) 50 MG tablet, Take 1 tablet (50 mg total) by mouth daily., Disp: 90 tablet, Rfl: 0 .  metoprolol succinate (TOPROL-XL) 50 MG 24 hr tablet, TAKE 1 TABLET BY MOUTH ONCE DAILY (TAKE  WITH  OR  IMMEDIATELY  FOLLOWING  A  MEAL), Disp: 90 tablet, Rfl: 1 .  NON FORMULARY, CPAP everynight, Disp: , Rfl:  .  triamcinolone cream (KENALOG) 0.1 %, Apply 1 application topically 2 (two) times daily., Disp: 30 g, Rfl: 0 .  TRULICITY 1.5 MG/0.5ML SOPN, INJECT 1.5 MG INTO THE SKIN ONCE A WEEK., Disp: 2 mL, Rfl: 2 .  colchicine 0.6 MG tablet, Take 1.2 mg or two tablets at first. Then one hour later, take 0.6 mg. (Patient not taking: Reported on 02/21/2019), Disp: 30 tablet, Rfl: 0  Review of Systems  Constitutional: Negative.   Respiratory: Negative.   Cardiovascular: Negative.   Genitourinary: Negative.   Neurological: Negative.     Social History   Tobacco Use  . Smoking status: Former Smoker    Types: Cigarettes    Quit date: 2020    Years since quitting: 1.1  . Smokeless tobacco: Never Used  Substance Use Topics  . Alcohol use: Not Currently      Objective:   There were no vitals taken for this visit.  There were no vitals filed for this visit.There is no height or weight on file to calculate BMI.   Physical Exam   No results found for any visits on 06/20/19.     Assessment & Plan    1. Erectile dysfunction, unspecified erectile dysfunction type  - sildenafil (VIAGRA) 50 MG tablet; Take 1 tablet (50 mg total) by mouth daily as needed for erectile dysfunction.  Dispense: 10 tablet; Refill: 0 I discussed the assessment and treatment plan with the patient. The patient was provided an opportunity to ask questions and all were answered. The patient agreed with the plan and demonstrated an understanding of the instructions.   The patient was advised to call back or seek an in-person evaluation if the symptoms worsen or if  the condition fails to improve as anticipated.  The entirety of the information documented in the History of Present Illness, Review of Systems and Physical Exam were personally obtained by me. Portions of this information were initially documented by [facility] and reviewed by me for thoroughness and accuracy.   F/u PRN       Trinna Post, PA-C  [facility]

## 2019-06-25 ENCOUNTER — Other Ambulatory Visit: Payer: Self-pay | Admitting: Physician Assistant

## 2019-06-25 DIAGNOSIS — N529 Male erectile dysfunction, unspecified: Secondary | ICD-10-CM

## 2019-06-25 DIAGNOSIS — R21 Rash and other nonspecific skin eruption: Secondary | ICD-10-CM

## 2019-06-25 DIAGNOSIS — M5416 Radiculopathy, lumbar region: Secondary | ICD-10-CM

## 2019-06-25 NOTE — Telephone Encounter (Signed)
Requested Prescriptions  Pending Prescriptions Disp Refills  . metoprolol succinate (TOPROL-XL) 50 MG 24 hr tablet [Pharmacy Med Name: METOPROLOL SUCC ER 50 MG TAB] 90 tablet 1    Sig: TAKE 1 TABLET BY MOUTH ONCE DAILY--TAKE WITH OR IMMEDIATELY FOLLOWING A MEAL     Cardiovascular:  Beta Blockers Failed - 06/25/2019  6:54 AM      Failed - Last BP in normal range    BP Readings from Last 1 Encounters:  04/11/19 (!) 148/82         Passed - Last Heart Rate in normal range    Pulse Readings from Last 1 Encounters:  04/11/19 78         Passed - Valid encounter within last 6 months    Recent Outpatient Visits          5 days ago Erectile dysfunction, unspecified erectile dysfunction type   [facility] [place], Adriana M, PA-C   2 months ago Essential (primary) hypertension   [employer], Adriana M, PA-C   3 months ago Essential (primary) hypertension   [employer], Eula Fried, FNP   4 months ago Bilateral carpal tunnel syndrome   [employer], Adriana M, PA-C   5 months ago Diabetes mellitus with nephropathy [facility])   [facility] [place], Lavella Hammock, PA-C      Future Appointments            In 1 month [place], Lavella Hammock, PA-C [facility]           . gabapentin (NEURONTIN) 600 MG tablet [employer] Med Name: GABAPENTIN 600 MG TABLET] 270 tablet     Sig: TAKE 1 TABLET BY MOUTH THREE TIMES A DAY     Neurology: Anticonvulsants - gabapentin Passed - 06/25/2019  6:54 AM      Passed - Valid encounter within last 12 months    Recent Outpatient Visits          5 days ago Erectile dysfunction, unspecified erectile dysfunction type   [facility] [place], Adriana M, PA-C   2 months ago Essential (primary) hypertension   [employer], Adriana M, PA-C   3 months ago Essential (primary) hypertension   [employer], Eula Fried, FNP   4 months ago Bilateral carpal tunnel syndrome   [employer], Adriana M, PA-C   5 months ago Diabetes mellitus with nephropathy [facility])   [facility] [place], Lavella Hammock, PA-C      Future Appointments            In 1 month [place], Adriana M, PA-C [facility]           . triamcinolone cream (KENALOG) 0.1 % [Pharmacy Med Name: TRIAMCINOLONE 0.1% CREAM] 30 g 0    Sig: APPLY TO AFFECTED AREA TWICE A DAY     Dermatology:  Corticosteroids Passed - 06/25/2019  6:54 AM      Passed - Valid encounter within last 12 months    Recent Outpatient Visits          5 days ago Erectile dysfunction, unspecified erectile dysfunction type   [facility] [place], Adriana M, PA-C   2 months ago Essential (primary) hypertension   [employer], [place], PA-C   3 months ago Essential (primary) hypertension   [employer], Eula Fried, FNP   4 months ago Bilateral carpal tunnel syndrome  [place], [place], PA-C   5 months ago Diabetes mellitus with nephropathy [facility])   [facility] Trinna Post, [place]      Future Appointments            In 1 month Terrilee Croak, Wendee Beavers, PA-C [employer], [facility]

## 2019-06-26 NOTE — Telephone Encounter (Signed)
Requested medication (s) are due for refill today: yes  Requested medication (s) are on the active medication list: yes  Last refill:  06/20/19  Future visit scheduled: yes  Notes to clinic:  insurance will not cover sildenafil.   Requested Prescriptions  Pending Prescriptions Disp Refills   tadalafil (CIALIS) 10 MG tablet [Pharmacy Med Name: TADALAFIL 10 MG TABLET] 10 tablet 0    Sig: Please specify directions, refills and quantity      Urology: Erectile Dysfunction Agents Failed - 06/25/2019  4:24 PM      Failed - Last BP in normal range    BP Readings from Last 1 Encounters:  04/11/19 (!) 148/82          Passed - Valid encounter within last 12 months    Recent Outpatient Visits           6 days ago Erectile dysfunction, unspecified erectile dysfunction type   [facility] [place], Lavella Hammock, PA-C   2 months ago Essential (primary) hypertension   [employer], [place], PA-C   3 months ago Essential (primary) hypertension   [employer], Eula Fried, FNP   4 months ago Bilateral carpal tunnel syndrome   [facility] Osvaldo Angst M, PA-C   5 months ago Diabetes mellitus with nephropathy [facility])   [facility] [place], Lavella Hammock, PA-C       Future Appointments             In 1 month Jodi Marble, Lavella Hammock, PA-C [facility]

## 2019-06-27 NOTE — Telephone Encounter (Signed)
Insurance want medication switched to Tadalafil 10 MG. Please advise.

## 2019-07-01 ENCOUNTER — Other Ambulatory Visit: Payer: Self-pay | Admitting: Physician Assistant

## 2019-07-01 DIAGNOSIS — R21 Rash and other nonspecific skin eruption: Secondary | ICD-10-CM

## 2019-07-01 NOTE — Telephone Encounter (Signed)
Requested Prescriptions  Pending Prescriptions Disp Refills  . triamcinolone cream (KENALOG) 0.1 % [Pharmacy Med Name: TRIAMCINOLONE 0.1% CREAM] 30 g 0    Sig: APPLY TO AFFECTED AREA TWICE A DAY     Dermatology:  Corticosteroids Passed - 07/01/2019  6:29 AM      Passed - Valid encounter within last 12 months    Recent Outpatient Visits          1 week ago Erectile dysfunction, unspecified erectile dysfunction type   [facility] [place], Lavella Hammock, PA-C   2 months ago Essential (primary) hypertension   [employer], [place], PA-C   3 months ago Essential (primary) hypertension   [employer], Eula Fried, FNP   4 months ago Bilateral carpal tunnel syndrome   [facility] Osvaldo Angst M, PA-C   5 months ago Diabetes mellitus with nephropathy [facility])   [facility] Trey Sailors, PA-C      Future Appointments            In 1 month Jodi Marble, Lavella Hammock, PA-C [facility]

## 2019-07-11 ENCOUNTER — Inpatient Hospital Stay: Payer: BC Managed Care – PPO | Attending: Oncology | Admitting: Oncology

## 2019-07-11 DIAGNOSIS — Z86711 Personal history of pulmonary embolism: Secondary | ICD-10-CM

## 2019-07-11 NOTE — Progress Notes (Signed)
Called at 12:50 and left message to call me back to get electronic record updated and I left a message to call me directly back. Then called after he spoke to [place] via video and pt does not answer.

## 2019-07-13 NOTE — Progress Notes (Signed)
I connected with [name] Walker on 07/13/19 at  1:00 PM EDT by video enabled telemedicine visit and verified that I am speaking with the correct person using two identifiers.   I discussed the limitations, risks, security and privacy concerns of performing an evaluation and management service by telemedicine and the availability of in-person appointments. I also discussed with the patient that there may be a patient responsible charge related to this service. The patient expressed understanding and agreed to proceed.  Other persons participating in the visit and their role in the encounter:  none  Patient's location:  home Provider's location:  work  Diagnosis- provoked pulmonary embolism currently on Eliquis  Chief complaint/ Reason for visit- routine f/u of PE  Heme/Onc history: patient is a 44 year old African-American male who was found to have septic arthritis of the left knee which required hospitalization in July 2020. This was complicated by rhabdomyolysis requiring hospitalization and IV fluids as well as possible pneumonia. During that hospital admission patient underwent CT chest which showed Pulmonary embolism in the right interlobar pulmonary artery and right lower lobe segmental and subsegmental branches with no evidence of right heart strain. Patient was discharged on Eliquis. Patient does have a history of morbid obesity with a BMI of 44. He is also a current smoker. Patient has tolerated Eliquis well so far without any significant side effects. Patient was found to have evidence of AKI during the hospitalization which is gradually improving but creatinine is still not back to baseline. No prior history of any DVT or PE. No family history of thrombosis.  Hypercoagulable work-up showed no evidence of prothrombin gene mutation of factor V Leiden mutation.  Protein C protein S and Antithrombin III levels were normal.  Antiphospholipid antibody syndrome testing was  negative.   Interval history- patient completed 6 months of anticoagulation in feb 2021. He is doing well presently. Reports no leg swelling/sob   Review of Systems  Constitutional: Negative for chills, fever, malaise/fatigue and weight loss.  HENT: Negative for congestion, ear discharge and nosebleeds.   Eyes: Negative for blurred vision.  Respiratory: Negative for cough, hemoptysis, sputum production, shortness of breath and wheezing.   Cardiovascular: Negative for chest pain, palpitations, orthopnea and claudication.  Gastrointestinal: Negative for abdominal pain, blood in stool, constipation, diarrhea, heartburn, melena, nausea and vomiting.  Genitourinary: Negative for dysuria, flank pain, frequency, hematuria and urgency.  Musculoskeletal: Negative for back pain, joint pain and myalgias.  Skin: Negative for rash.  Neurological: Negative for dizziness, tingling, focal weakness, seizures, weakness and headaches.  Endo/Heme/Allergies: Does not bruise/bleed easily.  Psychiatric/Behavioral: Negative for depression and suicidal ideas. The patient does not have insomnia.     Allergies  Allergen Reactions  . Ace Inhibitors Cough  . Amlodipine     Pedal edema  . Daptomycin Other (See Comments)    Makes CK levels go very high  . Metformin And Related     diarrhea  . Other     Seasonal allergies     Past Medical History:  Diagnosis Date  . Hypertension   . pre diabetes    hgb A!C 6.0  . Pulmonary embolism (HCC)   . Sleep apnea     Past Surgical History:  Procedure Laterality Date  . TONSILLECTOMY  1985  . UMBILICAL HERNIA REPAIR      Social History   Socioeconomic History  . Marital status: Married    Spouse name: Not on file  . Number of children: Not on file  .  Years of education: Not on file  . Highest education level: Not on file  Occupational History  . Occupation: [employer]  Tobacco Use  . Smoking status: Former Smoker    Types: Cigarettes    Quit date:  2020    Years since quitting: 1.2  . Smokeless tobacco: Never Used  Substance and Sexual Activity  . Alcohol use: Not Currently  . Drug use: Never  . Sexual activity: Yes    Birth control/protection: None  Other Topics Concern  . Not on file  Social History Narrative  . Not on file   Social Determinants of Health   Financial Resource Strain:   . Difficulty of Paying Living Expenses:   Food Insecurity:   . Worried About Charity fundraiser in the Last Year:   . Arboriculturist in the Last Year:   Transportation Needs:   . Film/video editor (Medical):   Marland Kitchen Lack of Transportation (Non-Medical):   Physical Activity: Insufficiently Active  . Days of Exercise per Week: 1 day  . Minutes of Exercise per Session: 10 min  Stress: No Stress Concern Present  . Feeling of Stress : Only a little  Social Connections: Slightly Isolated  . Frequency of Communication with Friends and Family: Twice a week  . Frequency of Social Gatherings with Friends and Family: Twice a week  . Attends Religious Services: 1 to 4 times per year  . Active Member of Clubs or Organizations: No  . Attends Archivist Meetings: Never  . Marital Status: Married  Human resources officer Violence: Not At Risk  . Fear of Current or Ex-Partner: No  . Emotionally Abused: No  . Physically Abused: No  . Sexually Abused: No    Family History  Problem Relation Age of Onset  . Diabetes Mother   . Cancer Mother        lung     Current Outpatient Medications:  .  atorvastatin (LIPITOR) 80 MG tablet, Take 1 tablet (80 mg total) by mouth daily., Disp: 90 tablet, Rfl: 1 .  colchicine 0.6 MG tablet, Take 1.2 mg or two tablets at first. Then one hour later, take 0.6 mg. (Patient not taking: Reported on 02/21/2019), Disp: 30 tablet, Rfl: 0 .  ELIQUIS 5 MG TABS tablet, TAKE 1 TABLET BY MOUTH TWICE A DAY, Disp: 60 tablet, Rfl: 2 .  gabapentin (NEURONTIN) 600 MG tablet, Take 1 tablet (600 mg total) by mouth 3 (three)  times daily., Disp: 270 tablet, Rfl: 0 .  hydrochlorothiazide (HYDRODIURIL) 25 MG tablet, Take 1 tablet (25 mg total) by mouth daily., Disp: 90 tablet, Rfl: 0 .  losartan (COZAAR) 50 MG tablet, Take 1 tablet (50 mg total) by mouth daily., Disp: 90 tablet, Rfl: 0 .  metoprolol succinate (TOPROL-XL) 50 MG 24 hr tablet, TAKE 1 TABLET BY MOUTH ONCE DAILY--TAKE WITH OR IMMEDIATELY FOLLOWING A MEAL, Disp: 90 tablet, Rfl: 1 .  NON FORMULARY, CPAP everynight, Disp: , Rfl:  .  tadalafil (CIALIS) 10 MG tablet, Take 1 tablet (10 mg total) by mouth daily as needed for erectile dysfunction., Disp: 10 tablet, Rfl: 0 .  triamcinolone cream (KENALOG) 0.1 %, APPLY TO AFFECTED AREA TWICE A DAY, Disp: 30 g, Rfl: 0 .  TRULICITY 1.5 JO/8.4ZY SOPN, INJECT 1.5 MG INTO THE SKIN ONCE A WEEK., Disp: 2 mL, Rfl: 2  No results found.  No images are attached to the encounter.   CMP Latest Ref Rng & Units 04/11/2019  Glucose 65 -  99 mg/dL 92  BUN 6 - 24 mg/dL 14(J)  Creatinine 0.92 - 1.27 mg/dL 9.57(M)  Sodium 734 - 037 mmol/L 142  Potassium 3.5 - 5.2 mmol/L 3.8  Chloride 96 - 106 mmol/L 101  CO2 20 - 29 mmol/L 27  Calcium 8.7 - 10.2 mg/dL 9.9  Total Protein 6.0 - 8.5 g/dL -  Total Bilirubin 0.0 - 1.2 mg/dL -  Alkaline Phos 39 - 096 IU/L -  AST 0 - 40 IU/L -  ALT 0 - 44 IU/L -   CBC Latest Ref Rng & Units 12/15/2018  WBC 3.4 - 10.8 x10E3/uL 4.9  Hemoglobin 13.0 - 17.7 g/dL 11.6(L)  Hematocrit 37.5 - 51.0 % 34.8(L)  Platelets 150 - 450 x10E3/uL 133(L)     Observation/objective:appears in no acute distress over video visit today. Breathing is non labored.   Assessment and plan: Patient is a 44 year old male with history of provoked pulmonary embolism in July 2020 s/p 6 months of anticoagulation.  Routine follow-up visit  Patient clearly had a provoked pulmonary embolism when he was in hospital for rhabdomyolysis and pneumonia.  He has completed 6 months of anticoagulation and does not require long-term  coagulation at this time.  His obesity and male sex remains risk factors for recurrence.  If he does have a recurrent VTE, he will need to remain on lifelong anticoagulation  Follow-up instructions: No need for hematology follow-up at this time  I discussed the assessment and treatment plan with the patient. The patient was provided an opportunity to ask questions and all were answered. The patient agreed with the plan and demonstrated an understanding of the instructions.   The patient was advised to call back or seek an in-person evaluation if the symptoms worsen or if the condition fails to improve as anticipated.   Visit Diagnosis: 1. History of pulmonary embolism     Dr. Owens Shark, MD, MPH [facility] at [facility] Tel- [phone] 07/13/2019 11:50 AM

## 2019-07-23 ENCOUNTER — Other Ambulatory Visit: Payer: Self-pay | Admitting: Physician Assistant

## 2019-07-23 DIAGNOSIS — N529 Male erectile dysfunction, unspecified: Secondary | ICD-10-CM

## 2019-07-23 NOTE — Telephone Encounter (Signed)
Requested medications are due for refill today? Alternative medication is requested.   New prescription needed due to insurance.  May need to discuss options with patient.    Requested medications are on active medication list?  No - New prescription needed.    Last Refill:    Future visit scheduled? Yes  Notes to Clinic:

## 2019-07-26 ENCOUNTER — Other Ambulatory Visit: Payer: Self-pay | Admitting: Physician Assistant

## 2019-07-26 DIAGNOSIS — N529 Male erectile dysfunction, unspecified: Secondary | ICD-10-CM

## 2019-07-26 NOTE — Telephone Encounter (Signed)
Requested medication (s) are due for refill today: yes  Requested medication (s) are on the active medication list: yes  Last refill: 06/27/19  Future visit scheduled:tes  Notes to clinic:  alternative requested; drug not covered    Requested Prescriptions  Pending Prescriptions Disp Refills   tadalafil (CIALIS) 10 MG tablet [Pharmacy Med Name: TADALAFIL 10 MG TABLET] 10 tablet 0    Sig: Please specify directions, refills and quantity      Urology: Erectile Dysfunction Agents Failed - 07/26/2019  9:23 AM      Failed - Last BP in normal range    BP Readings from Last 1 Encounters:  04/11/19 (!) 148/82          Passed - Valid encounter within last 12 months    Recent Outpatient Visits           1 month ago Erectile dysfunction, unspecified erectile dysfunction type   [facility] [place], Adriana M, PA-C   3 months ago Essential (primary) hypertension   [employer], [place], PA-C   4 months ago Essential (primary) hypertension   [employer], Eula Fried, FNP   5 months ago Bilateral carpal tunnel syndrome   [facility] Osvaldo Angst M, PA-C   6 months ago Diabetes mellitus with nephropathy [facility])   [facility] Trey Sailors, [place]       Future Appointments             In 2 weeks Trey Sailors, PA-C [facility]

## 2019-07-26 NOTE — Telephone Encounter (Signed)
I have already had an in depth conversation with this patient at the time of prescription as I do with this medication. His insurance will never cover this medication and it is expensive. He needs to call pharmacies and inquire about cash price, look at good RX coupons and look into mail order pharmacy like [facility] for cheaper prices. I have sent in refills for vardenafil and I have previously sent in tadalafil and sildenafil at his request.

## 2019-08-01 ENCOUNTER — Other Ambulatory Visit: Payer: Self-pay | Admitting: Physician Assistant

## 2019-08-12 NOTE — Progress Notes (Deleted)
    Established patient visit  {the wildcard for the provider attestation at the bottom of the note has been replaced with a short label [provider attestation**:1] to remind the CMA NOT to document over the wildcard. You can F2 to the label then type directly over it with the provider attestation dot phrase:1}   Patient: [name] Walker   DOB: 09-Feb-1976   44 y.o. Male  MRN: [id number] Visit Date: 08/12/2019  Today's healthcare provider: Trey Sailors, PA-C  Subjective:   No chief complaint on file.  Erectile Dysfunction   ***  {Show patient history (optional):23778::" "}   Medications: Outpatient Medications Prior to Visit  Medication Sig  . atorvastatin (LIPITOR) 80 MG tablet Take 1 tablet (80 mg total) by mouth daily.  . colchicine 0.6 MG tablet Take 1.2 mg or two tablets at first. Then one hour later, take 0.6 mg. (Patient not taking: Reported on 02/21/2019)  . ELIQUIS 5 MG TABS tablet TAKE 1 TABLET BY MOUTH TWICE A DAY  . gabapentin (NEURONTIN) 600 MG tablet Take 1 tablet (600 mg total) by mouth 3 (three) times daily.  . hydrochlorothiazide (HYDRODIURIL) 25 MG tablet TAKE 1 TABLET BY MOUTH EVERY DAY  . losartan (COZAAR) 50 MG tablet Take 1 tablet (50 mg total) by mouth daily.  . metoprolol succinate (TOPROL-XL) 50 MG 24 hr tablet TAKE 1 TABLET BY MOUTH ONCE DAILY--TAKE WITH OR IMMEDIATELY FOLLOWING A MEAL  . NON FORMULARY CPAP everynight  . triamcinolone cream (KENALOG) 0.1 % APPLY TO AFFECTED AREA TWICE A DAY  . TRULICITY 1.5 MG/0.5ML SOPN INJECT 1.5 MG INTO THE SKIN ONCE A WEEK.  . Vardenafil HCl 10 MG TBDP Use 1 tablet an hour before sexual activity.   No facility-administered medications prior to visit.    Review of Systems  {Show previous labs (optional):23779::" "}    Objective:    There were no vitals taken for this visit. {Show previous vital signs (optional):23777::" "}  Physical Exam  ***  No results found for any visits on 08/15/19.    Assessment &  Plan:    ***  No follow-ups on file.     {provider attestation***:1}   Maryella Shivers  [facility] [phone] (phone) [phone] (fax)  [facility]

## 2019-08-15 ENCOUNTER — Ambulatory Visit: Payer: BC Managed Care – PPO | Admitting: Physician Assistant

## 2019-08-15 ENCOUNTER — Telehealth: Payer: Self-pay

## 2019-08-15 NOTE — Telephone Encounter (Signed)
Copied from CRM [id number]. Topic: General - Other >> Aug 15, 2019 11:58 AM Elliot Gault wrote: Reason for CRM: patient had to [facility] follow up appointment schedule for today due to car trouble, patient [facility] appt to Monday 4/26.

## 2019-08-16 NOTE — Progress Notes (Signed)
Established patient visit    Patient: [name] Walker   DOB: October 13, 1975   44 y.o. Male  MRN: [id number] Visit Date: 08/22/2019  Today's healthcare provider: Trinna Post, PA-C   Chief Complaint  Patient presents with  . Erectile Dysfunction  . Diabetes  . Hypertension  . Hyperlipidemia   Subjective    HPI Follow up for Erectile Dysfunction  The patient was last seen for this 02/21/202, 2 months ago. Changes made at last visit include sildenafil (VIAGRA) 50 MG tablet; Take 1 tablet (50 mg total) by mouth daily as needed for erectile dysfunction   He reports good compliance with treatment. He feels that condition is Improved. He is not having side effects.   -----------------------------------------------------------------------------------------  Diabetes Mellitus Type II, follow-up  Lab Results  Component Value Date   HGBA1C 6.2 (H) 04/11/2019   HGBA1C 6.5 (H) 01/17/2019   HGBA1C 7.4 (A) 12/09/2018   Last seen for diabetes 4 months ago.  Management since then includes continuing the same treatment. He reports good compliance with treatment. He is not having side effects.   Home blood sugar records: not checked  Episodes of hypoglycemia? No    Current insulin regiment: Trulicity 1.5 BJ/6.2 ML  Most Recent Eye Exam: UTD  ----------------------------------------------------------------------------------------- Hypertension, follow-up  BP Readings from Last 3 Encounters:  08/22/19 (!) 158/98  04/11/19 (!) 148/82  03/21/19 (!) 114/92   He was last seen for hypertension 4 months ago.  BP at that visit was 148/82. Management since that visit includes none. Patient states he has not been taking the Losartan 50 mg for a few months. He reports good compliance with treatment. He is not having side effects.  He is not exercising. He is not adherent to low salt diet.   Outside blood pressures are not being checked.  He does not smoke.  Use of agents  associated with hypertension: none.   ----------------------------------------------------------------------------------------- Lipid/Cholesterol, follow-up  Last Lipid Panel: Lab Results  Component Value Date   CHOL 107 12/15/2018   LDLCALC 62 12/15/2018   HDL 31 (L) 12/15/2018   TRIG 72 12/15/2018   ALT 22 03/21/2019   AST 23 03/21/2019   PLT 133 (L) 12/15/2018    He was last seen for this 4 months ago.  Management since that visit includes none.  He reports good compliance with treatment. He is not having side effects.   Symptoms: No appetite changes No foot ulcerations No chest pain No chest pressure/discomfort No dyspnea No fatigue No lower extremity edema No nausea No numbness or tingling of extremity No orthopnea No palpitations No paroxysmal nocturnal dyspnea No polydipsia No polyuria No speech difficulty No syncope No visual disturbances   He is following a Regular, unhealthy  diet. Current exercise: none  Wt Readings from Last 3 Encounters:  08/22/19 (!) 345 lb 8 oz (156.7 kg)  04/11/19 (!) 321 lb (145.6 kg)  03/21/19 (!) 317 lb 6.4 oz (144 kg)   Last metabolic panel Lab Results  Component Value Date   GLUCOSE 92 04/11/2019   NA 142 04/11/2019   K 3.8 04/11/2019   BUN 26 (H) 04/11/2019   CREATININE 1.52 (H) 04/11/2019   GFRNONAA 56 (L) 04/11/2019   GFRAA 65 04/11/2019   CALCIUM 9.9 04/11/2019   AST 23 03/21/2019   ALT 22 03/21/2019   The ASCVD Risk score (Goff DC Jr., et al., 2013) failed to calculate for the following reasons:   The valid total cholesterol range is 130  to 320 mg/dL  Patient being seen for lumbar radiculopathy. He is on gabapentin 600 mg TID currently and he has seen Dr. Yves Dill at [facility] and got a nerve ablation in 05/2019. Still reports lumbar pain.  -----------------------------------------------------------------------------------------        Medications: Outpatient Medications Prior to Visit    Medication Sig  . NON FORMULARY CPAP everynight  . [DISCONTINUED] atorvastatin (LIPITOR) 80 MG tablet Take 1 tablet (80 mg total) by mouth daily.  . [DISCONTINUED] gabapentin (NEURONTIN) 600 MG tablet Take 1 tablet (600 mg total) by mouth 3 (three) times daily.  . [DISCONTINUED] hydrochlorothiazide (HYDRODIURIL) 25 MG tablet TAKE 1 TABLET BY MOUTH EVERY DAY  . [DISCONTINUED] losartan (COZAAR) 50 MG tablet Take 1 tablet (50 mg total) by mouth daily.  . [DISCONTINUED] metoprolol succinate (TOPROL-XL) 50 MG 24 hr tablet TAKE 1 TABLET BY MOUTH ONCE DAILY--TAKE WITH OR IMMEDIATELY FOLLOWING A MEAL  . [DISCONTINUED] triamcinolone cream (KENALOG) 0.1 % APPLY TO AFFECTED AREA TWICE A DAY  . [DISCONTINUED] TRULICITY 1.5 MG/0.5ML SOPN INJECT 1.5 MG INTO THE SKIN ONCE A WEEK.  . [DISCONTINUED] Vardenafil HCl 10 MG TBDP Use 1 tablet an hour before sexual activity.  . colchicine 0.6 MG tablet Take 1.2 mg or two tablets at first. Then one hour later, take 0.6 mg. (Patient not taking: Reported on 02/21/2019)  . ELIQUIS 5 MG TABS tablet TAKE 1 TABLET BY MOUTH TWICE A DAY (Patient not taking: Reported on 08/22/2019)   No facility-administered medications prior to visit.    Review of Systems  Constitutional: Negative.   Respiratory: Negative.   Cardiovascular: Negative.   Hematological: Negative.        Objective    BP (!) 158/98 (BP Location: Left Arm, Patient Position: Sitting, Cuff Size: Large)   Pulse (!) 109   Temp (!) 96.9 F (36.1 C) (Temporal)   Wt (!) 345 lb 8 oz (156.7 kg)   SpO2 95%   BMI 51.02 kg/m    Physical Exam Constitutional:      Appearance: Normal appearance. He is obese.  Cardiovascular:     Rate and Rhythm: Normal rate and regular rhythm.     Heart sounds: Normal heart sounds.  Pulmonary:     Effort: Pulmonary effort is normal.     Breath sounds: Normal breath sounds.  Skin:    General: Skin is warm and dry.  Neurological:     Mental Status: He is alert and  oriented to person, place, and time. Mental status is at baseline.  Psychiatric:        Mood and Affect: Mood normal.        Behavior: Behavior normal.       No results found for any visits on 08/22/19.   Assessment & Plan    1. Essential (primary) hypertension  Not taking Losartan currently. Restart losartan and continue other medicines. Check labs as below.   - hydrochlorothiazide (HYDRODIURIL) 25 MG tablet; Take 1 tablet (25 mg total) by mouth daily.  Dispense: 90 tablet; Refill: 0 - losartan (COZAAR) 50 MG tablet; Take 1 tablet (50 mg total) by mouth daily.  Dispense: 90 tablet; Refill: 0 - metoprolol succinate (TOPROL-XL) 50 MG 24 hr tablet; TAKE 1 TABLET BY MOUTH ONCE DAILY--TAKE WITH OR IMMEDIATELY FOLLOWING A MEAL  Dispense: 90 tablet; Refill: 1 - Comprehensive Metabolic Panel (CMET)  2. Diabetes mellitus with nephropathy (HCC)  - HgB A1c  3. Mixed hyperlipidemia  - atorvastatin (LIPITOR) 80 MG tablet; Take 1 tablet (  80 mg total) by mouth daily.  Dispense: 90 tablet; Refill: 1 - Lipid Profile  4. Lumbar radiculopathy  Add cymbalta 20 mg. Advised to reach back out and schedule with Dr. Yves Dill.   - gabapentin (NEURONTIN) 600 MG tablet; Take 1 tablet (600 mg total) by mouth 3 (three) times daily.  Dispense: 270 tablet; Refill: 0  5. Rash  - triamcinolone cream (KENALOG) 0.1 %; APPLY TO AFFECTED AREA TWICE A DAY  Dispense: 30 g; Refill: 0  6. Diabetes mellitus without complication (HCC)  - Dulaglutide (TRULICITY) 1.5 MG/0.5ML SOPN; Inject 1.5 mg into the skin once a week.  Dispense: 2 mL; Refill: 2  7. Multiple subsegmental pulmonary emboli without acute cor pulmonale (HCC)   8. Morbid obesity (HCC)   9. Lumbar pain  - DULoxetine (CYMBALTA) 20 MG capsule; Take 1 capsule (20 mg total) by mouth daily.  Dispense: 90 capsule; Refill: 0  10. Erectile dysfunction, unspecified erectile dysfunction type  - vardenafil (LEVITRA) 10 MG tablet; Take 1 tablet (10 mg  total) by mouth daily as needed for erectile dysfunction.  Dispense: 10 tablet; Refill: 0   Return in about 3 months (around 11/21/2019) for chronic.      ITrey Sailors, PA-C, have reviewed all documentation for this visit. The documentation on 08/22/19 for the exam, diagnosis, procedures, and orders are all accurate and complete.    Maryella Shivers  [facility] [phone] (phone) [phone] (fax)  [facility] -----------------------------------------------------------------------------------------

## 2019-08-22 ENCOUNTER — Ambulatory Visit (INDEPENDENT_AMBULATORY_CARE_PROVIDER_SITE_OTHER): Payer: BC Managed Care – PPO | Admitting: Physician Assistant

## 2019-08-22 ENCOUNTER — Other Ambulatory Visit: Payer: Self-pay

## 2019-08-22 ENCOUNTER — Encounter: Payer: Self-pay | Admitting: Physician Assistant

## 2019-08-22 VITALS — BP 158/98 | HR 109 | Temp 96.9°F | Wt 345.5 lb

## 2019-08-22 DIAGNOSIS — M5416 Radiculopathy, lumbar region: Secondary | ICD-10-CM | POA: Diagnosis not present

## 2019-08-22 DIAGNOSIS — N529 Male erectile dysfunction, unspecified: Secondary | ICD-10-CM

## 2019-08-22 DIAGNOSIS — I10 Essential (primary) hypertension: Secondary | ICD-10-CM

## 2019-08-22 DIAGNOSIS — I2694 Multiple subsegmental pulmonary emboli without acute cor pulmonale: Secondary | ICD-10-CM

## 2019-08-22 DIAGNOSIS — E782 Mixed hyperlipidemia: Secondary | ICD-10-CM | POA: Diagnosis not present

## 2019-08-22 DIAGNOSIS — M545 Low back pain, unspecified: Secondary | ICD-10-CM

## 2019-08-22 DIAGNOSIS — E1121 Type 2 diabetes mellitus with diabetic nephropathy: Secondary | ICD-10-CM

## 2019-08-22 DIAGNOSIS — R21 Rash and other nonspecific skin eruption: Secondary | ICD-10-CM

## 2019-08-22 DIAGNOSIS — E119 Type 2 diabetes mellitus without complications: Secondary | ICD-10-CM

## 2019-08-22 MED ORDER — ATORVASTATIN CALCIUM 80 MG PO TABS: 80.0000 mg | ORAL_TABLET | Freq: Every day | ORAL | 1 refills | Status: DC

## 2019-08-22 MED ORDER — TRIAMCINOLONE ACETONIDE 0.1 % EX CREA: TOPICAL_CREAM | CUTANEOUS | 0 refills | Status: DC

## 2019-08-22 MED ORDER — HYDROCHLOROTHIAZIDE 25 MG PO TABS: 25.0000 mg | ORAL_TABLET | Freq: Every day | ORAL | 0 refills | Status: DC

## 2019-08-22 MED ORDER — METOPROLOL SUCCINATE ER 50 MG PO TB24: ORAL_TABLET | ORAL | 1 refills | Status: DC

## 2019-08-22 MED ORDER — GABAPENTIN 600 MG PO TABS: 600.0000 mg | ORAL_TABLET | Freq: Three times a day (TID) | ORAL | 0 refills | Status: DC

## 2019-08-22 MED ORDER — VARDENAFIL HCL 10 MG PO TABS: 10.0000 mg | ORAL_TABLET | Freq: Every day | ORAL | 0 refills | Status: DC | PRN

## 2019-08-22 MED ORDER — TRULICITY 1.5 MG/0.5ML ~~LOC~~ SOAJ: 1.5000 mg | SUBCUTANEOUS | 2 refills | Status: DC

## 2019-08-22 MED ORDER — DULOXETINE HCL 20 MG PO CPEP: 20.0000 mg | ORAL_CAPSULE | Freq: Every day | ORAL | 0 refills | Status: DC

## 2019-08-22 MED ORDER — LOSARTAN POTASSIUM 50 MG PO TABS: 50.0000 mg | ORAL_TABLET | Freq: Every day | ORAL | 0 refills | Status: DC

## 2019-08-22 NOTE — Patient Instructions (Addendum)
[facility]: for affordablecoverage of viagra Please schedule vaccine with MyChart for COVID Please call Dr. Sharlet Salina for your back.      Diabetes Mellitus and Exercise Exercising regularly is important for your overall health, especially when you have diabetes (diabetes mellitus). Exercising is not only about losing weight. It has many other health benefits, such as increasing muscle strength and bone density and reducing body fat and stress. This leads to improved fitness, flexibility, and endurance, all of which result in better overall health. Exercise has additional benefits for people with diabetes, including:  Reducing appetite.  Helping to lower and control blood glucose.  Lowering blood pressure.  Helping to control amounts of fatty substances (lipids) in the blood, such as cholesterol and triglycerides.  Helping the body to respond better to insulin (improving insulin sensitivity).  Reducing how much insulin the body needs.  Decreasing the risk for heart disease by: ? Lowering cholesterol and triglyceride levels. ? Increasing the levels of good cholesterol. ? Lowering blood glucose levels. What is my activity plan? Your health care provider or certified diabetes educator can help you make a plan for the type and frequency of exercise (activity plan) that works for you. Make sure that you:  Do at least 150 minutes of moderate-intensity or vigorous-intensity exercise each week. This could be brisk walking, biking, or water aerobics. ? Do stretching and strength exercises, such as yoga or weightlifting, at least 2 times a week. ? Spread out your activity over at least 3 days of the week.  Get some form of physical activity every day. ? Do not go more than 2 days in a row without some kind of physical activity. ? Avoid being inactive for more than 30 minutes at a time. Take frequent breaks to walk or stretch.  Choose a type of exercise or activity that you enjoy, and set  realistic goals.  Start slowly, and gradually increase the intensity of your exercise over time. What do I need to know about managing my diabetes?   Check your blood glucose before and after exercising. ? If your blood glucose is 240 mg/dL (13.3 mmol/L) or higher before you exercise, check your urine for ketones. If you have ketones in your urine, do not exercise until your blood glucose returns to normal. ? If your blood glucose is 100 mg/dL (5.6 mmol/L) or lower, eat a snack containing 15-20 grams of carbohydrate. Check your blood glucose 15 minutes after the snack to make sure that your level is above 100 mg/dL (5.6 mmol/L) before you start your exercise.  Know the symptoms of low blood glucose (hypoglycemia) and how to treat it. Your risk for hypoglycemia increases during and after exercise. Common symptoms of hypoglycemia can include: ? Hunger. ? Anxiety. ? Sweating and feeling clammy. ? Confusion. ? Dizziness or feeling light-headed. ? Increased heart rate or palpitations. ? Blurry vision. ? Tingling or numbness around the mouth, lips, or tongue. ? Tremors or shakes. ? Irritability.  Keep a rapid-acting carbohydrate snack available before, during, and after exercise to help prevent or treat hypoglycemia.  Avoid injecting insulin into areas of the body that are going to be exercised. For example, avoid injecting insulin into: ? The arms, when playing tennis. ? The legs, when jogging.  Keep records of your exercise habits. Doing this can help you and your health care provider adjust your diabetes management plan as needed. Write down: ? Food that you eat before and after you exercise. ? Blood glucose levels before  and after you exercise. ? The type and amount of exercise you have done. ? When your insulin is expected to peak, if you use insulin. Avoid exercising at times when your insulin is peaking.  When you start a new exercise or activity, work with your health care  provider to make sure the activity is safe for you, and to adjust your insulin, medicines, or food intake as needed.  Drink plenty of water while you exercise to prevent dehydration or heat stroke. Drink enough fluid to keep your urine clear or pale yellow. Summary  Exercising regularly is important for your overall health, especially when you have diabetes (diabetes mellitus).  Exercising has many health benefits, such as increasing muscle strength and bone density and reducing body fat and stress.  Your health care provider or certified diabetes educator can help you make a plan for the type and frequency of exercise (activity plan) that works for you.  When you start a new exercise or activity, work with your health care provider to make sure the activity is safe for you, and to adjust your insulin, medicines, or food intake as needed. This information is not intended to replace advice given to you by your health care provider. Make sure you discuss any questions you have with your health care provider. Document Revised: 11/06/2016 Document Reviewed: 09/24/2015 Elsevier Patient Education  2020 [employer].

## 2019-08-23 ENCOUNTER — Encounter: Payer: Self-pay | Admitting: Physician Assistant

## 2019-08-23 LAB — LIPID PANEL
Chol/HDL Ratio: 3.6 ratio (ref 0.0–5.0)
Cholesterol, Total: 169 mg/dL (ref 100–199)
HDL: 47 mg/dL (ref >39)
LDL Chol Calc (NIH): 97 mg/dL (ref 0–99)
Triglycerides: 141 mg/dL (ref 0–149)
VLDL Cholesterol Cal: 25 mg/dL (ref 5–40)

## 2019-08-23 LAB — COMPREHENSIVE METABOLIC PANEL
ALT: 23 IU/L (ref 0–44)
AST: 25 IU/L (ref 0–40)
Albumin/Globulin Ratio: 1.3 (ref 1.2–2.2)
Albumin: 4 g/dL (ref 4.0–5.0)
Alkaline Phosphatase: 81 IU/L (ref 39–117)
BUN/Creatinine Ratio: 12 (ref 9–20)
BUN: 18 mg/dL (ref 6–24)
Bilirubin Total: <0.2 mg/dL (ref 0.0–1.2)
CO2: 27 mmol/L (ref 20–29)
Calcium: 9.5 mg/dL (ref 8.7–10.2)
Chloride: 100 mmol/L (ref 96–106)
Creatinine, Ser: 1.5 mg/dL — ABNORMAL HIGH (ref 0.76–1.27)
GFR calc Af Amer: 65 mL/min/{1.73_m2} (ref >59)
GFR calc non Af Amer: 57 mL/min/{1.73_m2} — ABNORMAL LOW (ref >59)
Globulin, Total: 3 g/dL (ref 1.5–4.5)
Glucose: 98 mg/dL (ref 65–99)
Potassium: 4 mmol/L (ref 3.5–5.2)
Sodium: 145 mmol/L — ABNORMAL HIGH (ref 134–144)
Total Protein: 7 g/dL (ref 6.0–8.5)

## 2019-08-23 LAB — HEMOGLOBIN A1C
Est. average glucose Bld gHb Est-mCnc: 140 mg/dL
Hgb A1c MFr Bld: 6.5 % — ABNORMAL HIGH (ref 4.8–5.6)

## 2019-09-06 ENCOUNTER — Encounter: Payer: Self-pay | Admitting: Physician Assistant

## 2019-09-17 ENCOUNTER — Other Ambulatory Visit: Payer: Self-pay | Admitting: Physician Assistant

## 2019-09-17 DIAGNOSIS — E119 Type 2 diabetes mellitus without complications: Secondary | ICD-10-CM

## 2019-09-17 NOTE — Telephone Encounter (Signed)
Requested Prescriptions  Pending Prescriptions Disp Refills  . TRULICITY 1.5 MG/0.5ML SOPN [Pharmacy Med Name: TRULICITY 1.5 MG/0.5 ML PEN] 2 pen 2    Sig: INJECT 1.5 MG INTO THE SKIN ONCE A WEEK.     Endocrinology:  Diabetes - GLP-1 Receptor Agonists Passed - 09/17/2019  1:28 PM      Passed - HBA1C is between 0 and 7.9 and within 180 days    Hemoglobin A1C  Date Value Ref Range Status  02/25/2013 6.1  Final   Hgb A1c MFr Bld  Date Value Ref Range Status  08/22/2019 6.5 (H) 4.8 - 5.6 % Final    Comment:             Prediabetes: 5.7 - 6.4          Diabetes: >6.4          Glycemic control for adults with diabetes: <7.0          Passed - Valid encounter within last 6 months    Recent Outpatient Visits          3 weeks ago Essential (primary) hypertension   [employer], Adriana M, PA-C   2 months ago Erectile dysfunction, unspecified erectile dysfunction type   [facility] [place], Adriana M, PA-C   5 months ago Essential (primary) hypertension   [employer], [place], PA-C   6 months ago Essential (primary) hypertension   [employer], Eula Fried, FNP   7 months ago Bilateral carpal tunnel syndrome   [facility] Trey Sailors, PA-C      Future Appointments            In 2 months Jodi Marble, Lavella Hammock, PA-C [employer]

## 2019-10-19 ENCOUNTER — Telehealth: Payer: Self-pay | Admitting: Physician Assistant

## 2019-10-19 NOTE — Telephone Encounter (Signed)
[place], with [facility] calling to inquire if patient has had renal panel or metabolic panel in the past few months. If so, she inquired if those could be faxed to them.    Fax# [phone]

## 2019-10-19 NOTE — Telephone Encounter (Signed)
Hello! Is it ok if we send over lab results to this office? Please advise. Thanks!

## 2019-10-19 NOTE — Telephone Encounter (Signed)
It's OK by me, can we run it by Joni Reining just to check? If so, then OK to fax.

## 2019-10-19 NOTE — Telephone Encounter (Signed)
Patient had a met c drawn in 07/2019. Ok to fax?

## 2019-11-03 ENCOUNTER — Other Ambulatory Visit: Payer: Self-pay | Admitting: Physician Assistant

## 2019-11-03 ENCOUNTER — Encounter: Payer: Self-pay | Admitting: Physician Assistant

## 2019-11-03 DIAGNOSIS — M545 Low back pain, unspecified: Secondary | ICD-10-CM

## 2019-11-03 NOTE — Telephone Encounter (Signed)
Call to triage? Please see mychart message. If no red flag signs and it is more of a chronic thing we can address it at follow up. If he wants to be seen sooner please schedule.

## 2019-11-13 ENCOUNTER — Other Ambulatory Visit: Payer: Self-pay | Admitting: Physician Assistant

## 2019-11-13 DIAGNOSIS — I10 Essential (primary) hypertension: Secondary | ICD-10-CM

## 2019-11-13 NOTE — Telephone Encounter (Signed)
Requested Prescriptions  Pending Prescriptions Disp Refills  . losartan (COZAAR) 50 MG tablet [Pharmacy Med Name: LOSARTAN POTASSIUM 50 MG TAB] 90 tablet 0    Sig: TAKE 1 TABLET BY MOUTH EVERY DAY     Cardiovascular:  Angiotensin Receptor Blockers Failed - 11/13/2019  9:07 AM      Failed - Cr in normal range and within 180 days    Creatinine  Date Value Ref Range Status  03/31/2013 0.83 0.60 - 1.30 mg/dL Final   Creatinine, Ser  Date Value Ref Range Status  08/22/2019 1.50 (H) 0.76 - 1.27 mg/dL Final         Failed - Last BP in normal range    BP Readings from Last 1 Encounters:  08/22/19 (!) 158/98         Passed - K in normal range and within 180 days    Potassium  Date Value Ref Range Status  08/22/2019 4.0 3.5 - 5.2 mmol/L Final  03/31/2013 3.3 (L) 3.5 - 5.1 mmol/L Final         Passed - Patient is not pregnant      Passed - Valid encounter within last 6 months    Recent Outpatient Visits          2 months ago Essential (primary) hypertension   [employer], Adriana M, PA-C   4 months ago Erectile dysfunction, unspecified erectile dysfunction type   [facility] [place], Adriana M, PA-C   7 months ago Essential (primary) hypertension   [employer], [place], PA-C   7 months ago Essential (primary) hypertension   [employer], Eula Fried, FNP   9 months ago Bilateral carpal tunnel syndrome   [facility] [place], Lavella Hammock, [place]      Future Appointments            In 1 month Jodi Marble, Lavella Hammock, PA-C [employer]

## 2019-11-21 ENCOUNTER — Encounter: Payer: Self-pay | Admitting: Physician Assistant

## 2019-11-21 ENCOUNTER — Ambulatory Visit: Payer: Self-pay | Admitting: Physician Assistant

## 2019-11-21 DIAGNOSIS — K469 Unspecified abdominal hernia without obstruction or gangrene: Secondary | ICD-10-CM

## 2019-11-24 ENCOUNTER — Encounter: Payer: Self-pay | Admitting: Surgery

## 2019-11-24 ENCOUNTER — Ambulatory Visit (INDEPENDENT_AMBULATORY_CARE_PROVIDER_SITE_OTHER): Payer: BC Managed Care – PPO | Admitting: Surgery

## 2019-11-24 ENCOUNTER — Other Ambulatory Visit: Payer: Self-pay

## 2019-11-24 VITALS — BP 188/114 | HR 102 | Temp 98.2°F | Ht 69.0 in | Wt 353.0 lb

## 2019-11-24 DIAGNOSIS — K430 Incisional hernia with obstruction, without gangrene: Secondary | ICD-10-CM | POA: Diagnosis not present

## 2019-11-24 DIAGNOSIS — R109 Unspecified abdominal pain: Secondary | ICD-10-CM

## 2019-11-24 NOTE — Patient Instructions (Addendum)
Dr.Rodenberg advised and discussed patient to lose weight before proceeding with surgical treatment. Patient has been scheduled for a CT Abdomen Pelvis with Contrast at [facility] for 12/05/19 with arrival time at 10:45am for scan at 11:00 am. Prep: NPO 4 hours prior and pick up prep kit. Patient verbalizes understanding. Patient is to have lab work at [facility] prior to scheduled CT scan.  Umbilical Hernia, Adult  A hernia is a bulge of tissue that pushes through an opening between muscles. An umbilical hernia happens in the abdomen, near the belly button (umbilicus). The hernia may contain tissues from the small intestine, large intestine, or fatty tissue covering the intestines (omentum). Umbilical hernias in adults tend to get worse over time, and they require surgical treatment. There are several types of umbilical hernias. You may have:  A hernia located just above or below the umbilicus (indirect hernia). This is the most common type of umbilical hernia in adults.  A hernia that forms through an opening formed by the umbilicus (direct hernia).  A hernia that comes and goes (reducible hernia). A reducible hernia may be visible only when you strain, lift something heavy, or cough. This type of hernia can be pushed back into the abdomen (reduced).  A hernia that traps abdominal tissue inside the hernia (incarcerated hernia). This type of hernia cannot be reduced.  A hernia that cuts off blood flow to the tissues inside the hernia (strangulated hernia). The tissues can start to die if this happens. This type of hernia requires emergency treatment. What are the causes? An umbilical hernia happens when tissue inside the abdomen presses on a weak area of the abdominal muscles. What increases the risk? You may have a greater risk of this condition if you:  Are obese.  Have had several pregnancies.  Have a buildup of fluid inside your abdomen (ascites).  Have had  surgery that weakens the abdominal muscles. What are the signs or symptoms? The main symptom of this condition is a painless bulge at or near the belly button. A reducible hernia may be visible only when you strain, lift something heavy, or cough. Other symptoms may include:  Dull pain.  A feeling of pressure. Symptoms of a strangulated hernia may include:  Pain that gets increasingly worse.  Nausea and vomiting.  Pain when pressing on the hernia.  Skin over the hernia becoming red or purple.  Constipation.  Blood in the stool. How is this diagnosed? This condition may be diagnosed based on:  A physical exam. You may be asked to cough or strain while standing. These actions increase the pressure inside your abdomen and force the hernia through the opening in your muscles. Your health care provider may try to reduce the hernia by pressing on it.  Your symptoms and medical history. How is this treated? Surgery is the only treatment for an umbilical hernia. Surgery for a strangulated hernia is done as soon as possible. If you have a small hernia that is not incarcerated, you may need to lose weight before having surgery. Follow these instructions at home:  Lose weight, if told by your health care provider.  Do not try to push the hernia back in.  Watch your hernia for any changes in color or size. Tell your health care provider if any changes occur.  You may need to avoid activities that increase pressure on your hernia.  Do not lift anything that is heavier than 10 lb (4.5 kg) until your health  care provider says that this is safe.  Take over-the-counter and prescription medicines only as told by your health care provider.  Keep all follow-up visits as told by your health care provider. This is important. Contact a health care provider if:  Your hernia gets larger.  Your hernia becomes painful. Get help right away if:  You develop sudden, severe pain near the area of  your hernia.  You have pain as well as nausea or vomiting.  You have pain and the skin over your hernia changes color.  You develop a fever. This information is not intended to replace advice given to you by your health care provider. Make sure you discuss any questions you have with your health care provider. Document Revised: 05/27/2017 Document Reviewed: 10/13/2016 Elsevier Patient Education  [place].

## 2019-11-24 NOTE — Progress Notes (Signed)
Patient ID: [name] A Tillis, male   DOB: 22-Feb-1976, 43 y.o.   MRN: [id number]  Chief Complaint: Recurrent umbilical hernia  History of Present Illness [name] Walker is a 44 y.o. male with a history of obstructive sleep apnea, noncompliant with CPAP wearing, with history of pulmonary emboli, currently taking anticoagulants.  Who presents with a BMI of 52, a prior history of umbilical hernia repair.  He describes recently having trouble resting due to cold symptoms, having coughing spasms that have created some left-sided rib pain.  He denies any particular pain involving the umbilical region, or the known bulge cephalad to his umbilicus.  He denies any changes in bowel activity having regular bowel movements.  During our conversation he is apparently falling asleep, and I believe this reflects his poor compliance with utilizing his CPAP and his sleep apnea at night.  As this is an afternoon visit.  Past Medical History Past Medical History:  Diagnosis Date  . Diabetes mellitus without complication (HCC)    hgb A!C 6.0  . Hypertension   . Pulmonary embolism (HCC)   . Sleep apnea       Past Surgical History:  Procedure Laterality Date  . TONSILLECTOMY  1985  . UMBILICAL HERNIA REPAIR      Allergies  Allergen Reactions  . Ace Inhibitors Cough  . Amlodipine     Pedal edema  . Daptomycin Other (See Comments)    Makes CK levels go very high  . Metformin And Related     diarrhea  . Other     Seasonal allergies     Current Outpatient Medications  Medication Sig Dispense Refill  . atorvastatin (LIPITOR) 80 MG tablet Take 1 tablet (80 mg total) by mouth daily. 90 tablet 1  . DULoxetine (CYMBALTA) 20 MG capsule TAKE 1 CAPSULE BY MOUTH EVERY DAY 90 capsule 0  . gabapentin (NEURONTIN) 600 MG tablet Take 1 tablet (600 mg total) by mouth 3 (three) times daily. 270 tablet 0  . hydrochlorothiazide (HYDRODIURIL) 25 MG tablet Take 1 tablet (25 mg total) by mouth daily. 90 tablet 0  . losartan  (COZAAR) 50 MG tablet TAKE 1 TABLET BY MOUTH EVERY DAY 90 tablet 0  . metoprolol succinate (TOPROL-XL) 50 MG 24 hr tablet TAKE 1 TABLET BY MOUTH ONCE DAILY--TAKE WITH OR IMMEDIATELY FOLLOWING A MEAL 90 tablet 1  . NON FORMULARY CPAP everynight    . TRULICITY 1.5 MG/0.5ML SOPN INJECT 1.5 MG INTO THE SKIN ONCE A WEEK. 2 pen 2  . vardenafil (LEVITRA) 10 MG tablet Take 1 tablet (10 mg total) by mouth daily as needed for erectile dysfunction. 10 tablet 0  . colchicine 0.6 MG tablet Take 1.2 mg or two tablets at first. Then one hour later, take 0.6 mg. (Patient not taking: Reported on 02/21/2019) 30 tablet 0  . ELIQUIS 5 MG TABS tablet TAKE 1 TABLET BY MOUTH TWICE A DAY (Patient not taking: Reported on 08/22/2019) 60 tablet 2  . triamcinolone cream (KENALOG) 0.1 % APPLY TO AFFECTED AREA TWICE A DAY (Patient not taking: Reported on 11/24/2019) 30 g 0   No current facility-administered medications for this visit.    Family History Family History  Problem Relation Age of Onset  . Diabetes Mother   . Cancer Mother        lung      Social History Social History   Tobacco Use  . Smoking status: Former Smoker    Types: Cigarettes    Quit date: 2020  Years since quitting: 1.5  . Smokeless tobacco: Never Used  Vaping Use  . Vaping Use: Never used  Substance Use Topics  . Alcohol use: Not Currently  . Drug use: Never        Review of Systems  Constitutional: Positive for malaise/fatigue (Fatigue/Afternoon somnolence. ).  HENT: Negative.   Eyes: Negative.   Respiratory: Positive for cough.   Cardiovascular: Positive for leg swelling.  Gastrointestinal: Positive for abdominal pain.  Genitourinary: Negative.   Musculoskeletal: Negative.   Skin: Negative.   Neurological: Negative.   Psychiatric/Behavioral: Negative.       Physical Exam Blood pressure (!) 188/114, pulse 102, temperature 98.2 F (36.8 C), temperature source Oral, height 5\' 9"  (1.753 m), weight (!) 353 lb (160.1 kg),  SpO2 90 %. Last Weight  Most recent update: 11/24/2019  3:04 PM   Weight  160.1 kg (353 lb)              CONSTITUTIONAL: Well developed, severely obese, appropriately responsive and aware without distress.  During my interview process and also sharing my recommendations and concerns for him, he would be frequently falling asleep with his eyes rolling back in his head and him losing his focus. EYES: Sclera non-icteric.   EARS, NOSE, MOUTH AND THROAT: Mask worn.   Hearing is intact to voice.  NECK: Trachea is midline, and there is no jugular venous distension.  LYMPH NODES:  Lymph nodes in the neck are not enlarged. RESPIRATORY:  Lungs are clear, and breath sounds are equal bilaterally. Normal respiratory effort without pathologic use of accessory muscles. CARDIOVASCULAR: Heart is regular in rate and rhythm. GI: The abdomen is  soft, nontender, and nondistended. There is a large supraumbilical mass.  Consistent with a chronically incarcerated recurrent umbilical hernia.  I could not appreciate hepatosplenomegaly. There were normal bowel sounds. MUSCULOSKELETAL:  Symmetrical muscle tone appreciated in all four extremities.    SKIN: Skin turgor is normal. No pathologic skin lesions appreciated.  NEUROLOGIC:  Motor and sensation appear grossly normal.  Cranial nerves are grossly without defect. PSYCH:  Alert and oriented to person, place and time. Affect is appropriate for situation.  Data Reviewed I have personally reviewed what is currently available of the patient's imaging, recent labs and medical records.   Labs:  CBC Latest Ref Rng & Units 12/15/2018 12/02/2018 11/22/2018  WBC 3.4 - 10.8 x10E3/uL 4.9 4.8 5.6  Hemoglobin 13.0 - 17.7 g/dL 11.6(L) 11.3(L) 11.4(L)  Hematocrit 37.5 - 51.0 % 34.8(L) 34.6(L) 34.0(L)  Platelets 150 - 450 x10E3/uL 133(L) CANCELED 150   CMP Latest Ref Rng & Units 08/22/2019 04/11/2019 03/21/2019  Glucose 65 - 99 mg/dL 98 92 86  BUN 6 - 24 mg/dL 18 03/23/2019) 23    Creatinine 0.76 - 1.27 mg/dL 23(R) 0.07(M) 2.26(J)  Sodium 134 - 144 mmol/L 145(H) 142 142  Potassium 3.5 - 5.2 mmol/L 4.0 3.8 3.9  Chloride 96 - 106 mmol/L 100 101 99  CO2 20 - 29 mmol/L 27 27 27   Calcium 8.7 - 10.2 mg/dL 9.5 9.9 3.35(K  Total Protein 6.0 - 8.5 g/dL 7.0 - 7.2  Total Bilirubin 0.0 - 1.2 mg/dL - 0.3  Alkaline Phos 39 - 117 IU/L 81 - 84  AST 0 - 40 IU/L 25 - 23  ALT 0 - 44 IU/L 23 - 22      Imaging: Within last 24 hrs: No results found.  Assessment    Recurrent ventral hernia. Patient Active Problem List   Diagnosis Date  Noted  . Multiple subsegmental pulmonary emboli without acute cor pulmonale (HCC) 08/22/2019  . BMI 45.0-49.9, adult (HCC) 02/14/2019  . Tobacco use disorder 11/10/2018  . Disease characterized by destruction of skeletal muscle 10/29/2018  . Cellulitis and abscess of left lower extremity 10/26/2018  . Morbid obesity (HCC) 09/10/2018  . History of gout 03/11/2018  . Hip pain 08/18/2016  . Lumbar radiculopathy 08/18/2016  . Chronic bilateral low back pain without sciatica 08/05/2016  . Acute anxiety 08/05/2016  . Essential (primary) hypertension 10/31/2015  . Diabetes mellitus with nephropathy (HCC) 10/31/2015  . HLD (hyperlipidemia) 10/31/2015  . OSA (obstructive sleep apnea) 10/31/2015  . Exomphalos 10/31/2015    Plan    Will obtain CT scan for baseline evaluation of fascial defect and hernia contents.  I am anticipating this is likely incarcerated omentum. It would be in his best interest to pursue lifestyle/bariatric surgery changes in regard to weight loss as he remains high risk for hernia recurrence/complications involving general anesthetic and surgery.  There is no immediate plan for any elective repair.  We have made referrals for nutritional counseling and for bariatric surgery consultation.  This was discussed in detail with him and I believe he understands the gravity of his situation and desires to pursue it.  We have also  discussed his failure to be compliant with his CPAP and how critical this is at him maintaining energy levels throughout the day to be functional and helpful in his personal care and for the care of his children.  I believe this is also understood and anticipate his responsiveness in correcting this path for his life.  Face-to-face time spent with the patient and accompanying care providers(if present) was 40 minutes, with more than 50% of the time spent counseling, educating, and coordinating care of the patient.      Campbell Lerner M.D., FACS 11/24/2019, 3:26 PM

## 2019-12-05 ENCOUNTER — Ambulatory Visit: Admission: RE | Admit: 2019-12-05 | Payer: BC Managed Care – PPO | Source: Ambulatory Visit

## 2019-12-20 ENCOUNTER — Other Ambulatory Visit: Payer: Self-pay | Admitting: Physician Assistant

## 2019-12-20 DIAGNOSIS — E119 Type 2 diabetes mellitus without complications: Secondary | ICD-10-CM

## 2019-12-22 ENCOUNTER — Other Ambulatory Visit: Payer: Self-pay | Admitting: Physician Assistant

## 2019-12-22 DIAGNOSIS — M5416 Radiculopathy, lumbar region: Secondary | ICD-10-CM

## 2019-12-22 NOTE — Telephone Encounter (Signed)
Requested  medications are  due for refill today yes  Requested medications are on the active medication list yes  Last refill 4/26  Last visit 11 months ago  Future visit scheduled yes, 8/30  Notes to clinic Needs dx code.

## 2019-12-23 NOTE — Progress Notes (Deleted)
Established patient visit   Patient: [name] Walker   DOB: July 28, 1975   44 y.o. Male  MRN: [id number] Visit Date: 12/26/2019  Today's healthcare provider: Trey Sailors, PA-C   No chief complaint on file.  Subjective    HPI  Diabetes Mellitus Type II, follow-up  Lab Results  Component Value Date   HGBA1C 6.5 (H) 08/22/2019   HGBA1C 6.2 (H) 04/11/2019   HGBA1C 6.5 (H) 01/17/2019   Last seen for diabetes 4 months ago.  Management since then includes continuing the same treatment. He reports {excellent/good/fair/poor:19665} compliance with treatment. He {is/is not:21021397} having side effects. {document side effects if present:1}  Home blood sugar records: {diabetes glucometry results:16657}  Episodes of hypoglycemia? {Yes/No:20286} {enter details if yes:1}   Current insulin regiment: {***Type 'None' if not taking insulin                                                otherwise enter complete                                                 details of insulin regiment:1} Most Recent Eye Exam: ***  --------------------------------------------------------------------------------------------------- Hypertension, follow-up  BP Readings from Last 3 Encounters:  11/24/19 (!) 188/114  08/22/19 (!) 158/98  04/11/19 (!) 148/82   Wt Readings from Last 3 Encounters:  11/24/19 (!) 353 lb (160.1 kg)  08/22/19 (!) 345 lb 8 oz (156.7 kg)  04/11/19 (!) 321 lb (145.6 kg)     He was last seen for hypertension 4 months ago.  BP at that visit was 158/98. Management since that visit includes restarted Losartan 50 MG. He reports {excellent/good/fair/poor:19665} compliance with treatment. He {is/is not:9024} having side effects. {document side effects if present:1} He {is/is not:9024} exercising. He {is/is not:9024} adherent to low salt diet.   Outside blood pressures are {enter patient reported home BP, or 'not being checked':1}.  He {does/does not:200015} smoke.  Use of  agents associated with hypertension: {bp agents assoc with hypertension:511::"none"}.   --------------------------------------------------------------------------------------------------- Lipid/Cholesterol, follow-up  Last Lipid Panel: Lab Results  Component Value Date   CHOL 169 08/22/2019   LDLCALC 97 08/22/2019   HDL 47 08/22/2019   TRIG 141 08/22/2019    He was last seen for this 4 months ago.  Management since that visit includes continue current medication.  He reports {excellent/good/fair/poor:19665} compliance with treatment. He {is/is not:9024} having side effects. {document side effects if present:1}  Symptoms: {Yes/No:20286} appetite changes {Yes/No:20286} foot ulcerations  {Yes/No:20286} chest pain {Yes/No:20286} chest pressure/discomfort  {Yes/No:20286} dyspnea {Yes/No:20286} orthopnea  {Yes/No:20286} fatigue {Yes/No:20286} lower extremity edema  {Yes/No:20286} palpitations {Yes/No:20286} paroxysmal nocturnal dyspnea  {Yes/No:20286} nausea {Yes/No:20286} numbness or tingling of extremity  {Yes/No:20286} polydipsia {Yes/No:20286} polyuria  {Yes/No:20286} speech difficulty {Yes/No:20286} syncope   He is following a {diet:21022986} diet. Current exercise: {exercise types:16438}  Last metabolic panel Lab Results  Component Value Date   GLUCOSE 98 08/22/2019   NA 145 (H) 08/22/2019   K 4.0 08/22/2019   BUN 18 08/22/2019   CREATININE 1.50 (H) 08/22/2019   GFRNONAA 57 (L) 08/22/2019   GFRAA 65 08/22/2019   CALCIUM 9.5 08/22/2019   AST 25 08/22/2019   ALT 23 08/22/2019   The  10-year ASCVD risk score Denman George DC Montez Hageman., et al., 2013) is: 20.6%  ---------------------------------------------------------------------------------------------------   {Show patient history (optional):23778::" "}   Medications: Outpatient Medications Prior to Visit  Medication Sig  . atorvastatin (LIPITOR) 80 MG tablet Take 1 tablet (80 mg total) by mouth daily.  . colchicine 0.6 MG  tablet Take 1.2 mg or two tablets at first. Then one hour later, take 0.6 mg. (Patient not taking: Reported on 02/21/2019)  . DULoxetine (CYMBALTA) 20 MG capsule TAKE 1 CAPSULE BY MOUTH EVERY DAY  . ELIQUIS 5 MG TABS tablet TAKE 1 TABLET BY MOUTH TWICE A DAY (Patient not taking: Reported on 08/22/2019)  . gabapentin (NEURONTIN) 600 MG tablet TAKE 1 TABLET BY MOUTH THREE TIMES A DAY  . hydrochlorothiazide (HYDRODIURIL) 25 MG tablet Take 1 tablet (25 mg total) by mouth daily.  Marland Kitchen losartan (COZAAR) 50 MG tablet TAKE 1 TABLET BY MOUTH EVERY DAY  . metoprolol succinate (TOPROL-XL) 50 MG 24 hr tablet TAKE 1 TABLET BY MOUTH ONCE DAILY--TAKE WITH OR IMMEDIATELY FOLLOWING A MEAL  . NON FORMULARY CPAP everynight  . triamcinolone cream (KENALOG) 0.1 % APPLY TO AFFECTED AREA TWICE A DAY (Patient not taking: Reported on 11/24/2019)  . TRULICITY 1.5 MG/0.5ML SOPN INJECT 1.5 MG INTO THE SKIN ONCE A WEEK.  . vardenafil (LEVITRA) 10 MG tablet Take 1 tablet (10 mg total) by mouth daily as needed for erectile dysfunction.   No facility-administered medications prior to visit.    Review of Systems  {Heme  Chem  Endocrine  Serology  Results Review (optional):23779::" "}  Objective    There were no vitals taken for this visit. {Show previous vital signs (optional):23777::" "}  Physical Exam  ***  No results found for any visits on 12/26/19.  Assessment & Plan     ***  No follow-ups on file.      {provider attestation***:1}   Maryella Shivers  [place] [phone] (phone) [phone] (fax)  [facility]

## 2019-12-24 ENCOUNTER — Other Ambulatory Visit: Payer: Self-pay | Admitting: Physician Assistant

## 2019-12-24 DIAGNOSIS — I10 Essential (primary) hypertension: Secondary | ICD-10-CM

## 2019-12-26 ENCOUNTER — Ambulatory Visit: Payer: Self-pay | Admitting: Physician Assistant

## 2020-01-13 ENCOUNTER — Other Ambulatory Visit: Payer: Self-pay | Admitting: Physician Assistant

## 2020-01-13 DIAGNOSIS — M545 Low back pain, unspecified: Secondary | ICD-10-CM

## 2020-02-15 ENCOUNTER — Other Ambulatory Visit: Payer: Self-pay | Admitting: Physician Assistant

## 2020-02-15 DIAGNOSIS — M5416 Radiculopathy, lumbar region: Secondary | ICD-10-CM

## 2020-02-15 DIAGNOSIS — I10 Essential (primary) hypertension: Secondary | ICD-10-CM

## 2020-02-24 ENCOUNTER — Ambulatory Visit: Payer: BC Managed Care – PPO | Admitting: Physician Assistant

## 2020-02-24 ENCOUNTER — Other Ambulatory Visit: Payer: Self-pay

## 2020-02-24 ENCOUNTER — Other Ambulatory Visit: Payer: Self-pay | Admitting: Physician Assistant

## 2020-02-24 ENCOUNTER — Encounter: Payer: Self-pay | Admitting: Physician Assistant

## 2020-02-24 VITALS — BP 134/80 | HR 105 | Temp 98.6°F | Wt 338.5 lb

## 2020-02-24 DIAGNOSIS — M79671 Pain in right foot: Secondary | ICD-10-CM

## 2020-02-24 DIAGNOSIS — E1121 Type 2 diabetes mellitus with diabetic nephropathy: Secondary | ICD-10-CM

## 2020-02-24 DIAGNOSIS — R109 Unspecified abdominal pain: Secondary | ICD-10-CM

## 2020-02-24 DIAGNOSIS — I10 Essential (primary) hypertension: Secondary | ICD-10-CM

## 2020-02-24 DIAGNOSIS — E782 Mixed hyperlipidemia: Secondary | ICD-10-CM

## 2020-02-24 DIAGNOSIS — Z2821 Immunization not carried out because of patient refusal: Secondary | ICD-10-CM

## 2020-02-24 MED ORDER — LOSARTAN POTASSIUM 50 MG PO TABS: 50.0000 mg | ORAL_TABLET | Freq: Every day | ORAL | 0 refills | Status: DC

## 2020-02-24 MED ORDER — ATORVASTATIN CALCIUM 80 MG PO TABS: 80.0000 mg | ORAL_TABLET | Freq: Every day | ORAL | 1 refills | Status: DC

## 2020-02-24 MED ORDER — HYDROCODONE-ACETAMINOPHEN 5-325 MG PO TABS: ORAL_TABLET | ORAL | 0 refills | Status: DC

## 2020-02-24 NOTE — Patient Instructions (Signed)
Diabetes Mellitus and Exercise Exercising regularly is important for your overall health, especially when you have diabetes (diabetes mellitus). Exercising is not only about losing weight. It has many other health benefits, such as increasing muscle strength and bone density and reducing body fat and stress. This leads to improved fitness, flexibility, and endurance, all of which result in better overall health. Exercise has additional benefits for people with diabetes, including:  Reducing appetite.  Helping to lower and control blood glucose.  Lowering blood pressure.  Helping to control amounts of fatty substances (lipids) in the blood, such as cholesterol and triglycerides.  Helping the body to respond better to insulin (improving insulin sensitivity).  Reducing how much insulin the body needs.  Decreasing the risk for heart disease by: ? Lowering cholesterol and triglyceride levels. ? Increasing the levels of good cholesterol. ? Lowering blood glucose levels. What is my activity plan? Your health care provider or certified diabetes educator can help you make a plan for the type and frequency of exercise (activity plan) that works for you. Make sure that you:  Do at least 150 minutes of moderate-intensity or vigorous-intensity exercise each week. This could be brisk walking, biking, or water aerobics. ? Do stretching and strength exercises, such as yoga or weightlifting, at least 2 times a week. ? Spread out your activity over at least 3 days of the week.  Get some form of physical activity every day. ? Do not go more than 2 days in a row without some kind of physical activity. ? Avoid being inactive for more than 30 minutes at a time. Take frequent breaks to walk or stretch.  Choose a type of exercise or activity that you enjoy, and set realistic goals.  Start slowly, and gradually increase the intensity of your exercise over time. What do I need to know about managing my  diabetes?   Check your blood glucose before and after exercising. ? If your blood glucose is 240 mg/dL (13.3 mmol/L) or higher before you exercise, check your urine for ketones. If you have ketones in your urine, do not exercise until your blood glucose returns to normal. ? If your blood glucose is 100 mg/dL (5.6 mmol/L) or lower, eat a snack containing 15-20 grams of carbohydrate. Check your blood glucose 15 minutes after the snack to make sure that your level is above 100 mg/dL (5.6 mmol/L) before you start your exercise.  Know the symptoms of low blood glucose (hypoglycemia) and how to treat it. Your risk for hypoglycemia increases during and after exercise. Common symptoms of hypoglycemia can include: ? Hunger. ? Anxiety. ? Sweating and feeling clammy. ? Confusion. ? Dizziness or feeling light-headed. ? Increased heart rate or palpitations. ? Blurry vision. ? Tingling or numbness around the mouth, lips, or tongue. ? Tremors or shakes. ? Irritability.  Keep a rapid-acting carbohydrate snack available before, during, and after exercise to help prevent or treat hypoglycemia.  Avoid injecting insulin into areas of the body that are going to be exercised. For example, avoid injecting insulin into: ? The arms, when playing tennis. ? The legs, when jogging.  Keep records of your exercise habits. Doing this can help you and your health care provider adjust your diabetes management plan as needed. Write down: ? Food that you eat before and after you exercise. ? Blood glucose levels before and after you exercise. ? The type and amount of exercise you have done. ? When your insulin is expected to peak, if you use   insulin. Avoid exercising at times when your insulin is peaking.  When you start a new exercise or activity, work with your health care provider to make sure the activity is safe for you, and to adjust your insulin, medicines, or food intake as needed.  Drink plenty of water while  you exercise to prevent dehydration or heat stroke. Drink enough fluid to keep your urine clear or pale yellow. Summary  Exercising regularly is important for your overall health, especially when you have diabetes (diabetes mellitus).  Exercising has many health benefits, such as increasing muscle strength and bone density and reducing body fat and stress.  Your health care provider or certified diabetes educator can help you make a plan for the type and frequency of exercise (activity plan) that works for you.  When you start a new exercise or activity, work with your health care provider to make sure the activity is safe for you, and to adjust your insulin, medicines, or food intake as needed. This information is not intended to replace advice given to you by your health care provider. Make sure you discuss any questions you have with your health care provider. Document Revised: 11/06/2016 Document Reviewed: 09/24/2015 Elsevier Patient Education  2020 [employer]  

## 2020-02-24 NOTE — Progress Notes (Signed)
Established patient visit   Patient: [name] Walker   DOB: 14-Jul-1975   44 y.o. Male  MRN: [id number] Visit Date: 02/24/2020  Today's healthcare provider: Trey Sailors, PA-C   Chief Complaint  Patient presents with  . Diabetes  . Hyperlipidemia  . Hypertension  I,Joren Rehm M Estle Huguley,acting as a scribe for [employer], PA-C.,have documented all relevant documentation on the behalf of Trey Sailors, PA-C,as directed by  Trey Sailors, PA-C while in the presence of Trey Sailors, PA-C.  Subjective    HPI  Diabetes Mellitus Type II, follow-up  Lab Results  Component Value Date   HGBA1C 6.5 (H) 02/24/2020   HGBA1C 6.5 (H) 08/22/2019   HGBA1C 6.2 (H) 04/11/2019   Last seen for diabetes 6 months ago.  Management since then includes continuing the same treatment. He reports good compliance with treatment. He is not having side effects.   Home blood sugar records: not checking  Episodes of hypoglycemia? No    Current insulin regiment: none Most Recent Eye Exam: 12/2018  --------------------------------------------------------------------------------------------------- Hypertension, follow-up  BP Readings from Last 3 Encounters:  02/24/20 134/80  11/24/19 (!) 188/114  08/22/19 (!) 158/98   Wt Readings from Last 3 Encounters:  02/24/20 (!) 338 lb 8 oz (153.5 kg)  11/24/19 (!) 353 lb (160.1 kg)  08/22/19 (!) 345 lb 8 oz (156.7 kg)     He was last seen for hypertension 6 months ago.  BP at that visit was 158/98. Management since that visit includes Restarted losartan and continue other medicines. He reports good compliance with treatment. He is not having side effects.  He is not exercising. He is adherent to low salt diet.   Outside blood pressures are .  He does not smoke.  Use of agents associated with hypertension: none.   --------------------------------------------------------------------------------------------------- Lipid/Cholesterol,  follow-up  Last Lipid Panel: Lab Results  Component Value Date   CHOL 169 08/22/2019   LDLCALC 97 08/22/2019   HDL 47 08/22/2019   TRIG 141 08/22/2019    He was last seen for this 6 months ago.  Management since that visit includes continue current medication.  He reports good compliance with treatment. He is not having side effects.   Symptoms: No appetite changes No foot ulcerations  No chest pain No chest pressure/discomfort  No dyspnea No orthopnea  No fatigue No lower extremity edema  No palpitations No paroxysmal nocturnal dyspnea  No nausea No numbness or tingling of extremity  No polydipsia No polyuria  No speech difficulty No syncope   He is following a Regular diet. Current exercise: no regular exercise  Last metabolic panel Lab Results  Component Value Date   GLUCOSE 87 02/24/2020   NA 139 02/24/2020   K 3.3 (L) 02/24/2020   BUN 16 02/24/2020   CREATININE 1.56 (H) 02/24/2020   GFRNONAA 54 (L) 02/24/2020   GFRAA 62 02/24/2020   CALCIUM 9.2 02/24/2020   AST 28 02/24/2020   ALT 30 02/24/2020   The 10-year ASCVD risk score Denman George DC Jr., et al., 2013) is: 11.3%  ---------------------------------------------------------------------------------------------------  Patient was seen in the ER yesterday for right foot pain. 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 However, xray was negative and it was not felt to be an infection. He would like a referral to podiatry.       Medications: Outpatient Medications Prior to Visit  Medication Sig  .  DULoxetine (CYMBALTA) 20 MG capsule TAKE 1 CAPSULE BY MOUTH EVERY DAY  . gabapentin (NEURONTIN) 600 MG tablet TAKE 1 TABLET BY MOUTH THREE TIMES A DAY  . hydrochlorothiazide (HYDRODIURIL) 25 MG tablet TAKE 1 TABLET BY MOUTH EVERY DAY  . metoprolol succinate (TOPROL-XL) 50 MG 24 hr tablet TAKE 1 TABLET BY MOUTH ONCE DAILY--TAKE WITH OR IMMEDIATELY FOLLOWING A MEAL   . NON FORMULARY CPAP everynight  . triamcinolone cream (KENALOG) 0.1 % APPLY TO AFFECTED AREA TWICE A DAY  . TRULICITY 1.5 MG/0.5ML SOPN INJECT 1.5 MG INTO THE SKIN ONCE A WEEK.  . vardenafil (LEVITRA) 10 MG tablet Take 1 tablet (10 mg total) by mouth daily as needed for erectile dysfunction.  . [DISCONTINUED] atorvastatin (LIPITOR) 80 MG tablet Take 1 tablet (80 mg total) by mouth daily.  . [DISCONTINUED] losartan (COZAAR) 50 MG tablet TAKE 1 TABLET BY MOUTH EVERY DAY  . [DISCONTINUED] colchicine 0.6 MG tablet Take 1.2 mg or two tablets at first. Then one hour later, take 0.6 mg. (Patient not taking: Reported on 02/24/2020)  . [DISCONTINUED] ELIQUIS 5 MG TABS tablet TAKE 1 TABLET BY MOUTH TWICE A DAY (Patient not taking: Reported on 02/24/2020)   No facility-administered medications prior to visit.    Review of Systems    Objective    BP 134/80 (BP Location: Left Arm, Patient Position: Sitting, Cuff Size: Large)   Pulse (!) 105   Temp 98.6 F (37 C) (Oral)   Wt (!) 338 lb 8 oz (153.5 kg)   SpO2 98%   BMI 49.99 kg/m    Physical Exam Constitutional:      Appearance: Normal appearance. He is obese.  Cardiovascular:     Rate and Rhythm: Normal rate and regular rhythm.     Pulses: Normal pulses.     Heart sounds: Normal heart sounds.  Pulmonary:     Effort: Pulmonary effort is normal.     Breath sounds: Normal breath sounds.  Skin:    General: Skin is warm and dry.  Neurological:     General: No focal deficit present.     Mental Status: He is alert and oriented to person, place, and time.  Psychiatric:        Mood and Affect: Mood normal.        Behavior: Behavior normal.       Results for orders placed or performed in visit on 02/24/20  Comprehensive metabolic panel  Result Value Ref Range   Glucose 87 65 - 99 mg/dL   BUN 16 6 - 24 mg/dL   Creatinine, Ser 7.94 (H) 0.76 - 1.27 mg/dL   GFR calc non Af Amer 54 (L) >59 mL/min/1.73   GFR calc Af Amer 62 >59  mL/min/1.73   BUN/Creatinine Ratio 10 9 - 20   Sodium 139 134 - 144 mmol/L   Potassium 3.3 (L) 3.5 - 5.2 mmol/L   Chloride 98 96 - 106 mmol/L   CO2 28 20 - 29 mmol/L   Calcium 9.2 8.7 - 10.2 mg/dL   Total Protein 7.1 6.0 - 8.5 g/dL   Albumin 3.7 (L) 4.0 - 5.0 g/dL   Globulin, Total 3.4 1.5 - 4.5 g/dL   Albumin/Globulin Ratio 1.1 (L) 1.2 - 2.2   Bilirubin Total 0.5 0.0 - 1.2 mg/dL   Alkaline Phosphatase 96 44 - 121 IU/L   AST 28 0 - 40 IU/L   ALT 30 0 - 44 IU/L  Hemoglobin A1c  Result Value Ref Range   Hgb  A1c MFr Bld 6.5 (H) 4.8 - 5.6 %   Est. average glucose Bld gHb Est-mCnc 140 mg/dL  Tissue transglutaminase, IgA  Result Value Ref Range   Transglutaminase IgA <2 0 - 3 U/mL  IgA  Result Value Ref Range   IgA/Immunoglobulin A, Serum 363 90 - 386 mg/dL    Assessment & Plan    1. Right foot pain  - Ambulatory referral to Orthopedics - HYDROcodone-acetaminophen (NORCO/VICODIN) 5-325 MG tablet; Take 1/2 tablet every 12 hours for pain.  Dispense: 5 tablet; Refill: 0  2. Essential (primary) hypertension  Stable, continue current medications.   - Comprehensive Metabolic Panel (CMET) - losartan (COZAAR) 50 MG tablet; Take 1 tablet (50 mg total) by mouth daily.  Dispense: 90 tablet; Refill: 0  3. Diabetes mellitus with nephropathy (HCC)  Counseled he is overdue for his eye exams. Continue current medications.  - HgB A1c  4. Mixed hyperlipidemia  Continue statins.  - atorvastatin (LIPITOR) 80 MG tablet; Take 1 tablet (80 mg total) by mouth daily.  Dispense: 90 tablet; Refill: 1  5. Abdominal pain, unspecified abdominal location  - IgA - Tissue transglutaminase, IgA  6. COVID-19 vaccination declined  Counseled extensively that he is at extremely high risk for complications and death from COVID.    Return in about 3 months (around 05/26/2020) for diabetes .      ITrey Sailors, PA-C, have reviewed all documentation for this visit. The documentation on 03/01/20  for the exam, diagnosis, procedures, and orders are all accurate and complete.  The entirety of the information documented in the History of Present Illness, Review of Systems and Physical Exam were personally obtained by me. Portions of this information were initially documented by [facility] and reviewed by me for thoroughness and accuracy.     Maryella Shivers  [facility] [phone] (phone) [phone] (fax)  [facility]

## 2020-02-26 LAB — IGA: IgA/Immunoglobulin A, Serum: 363 mg/dL (ref 90–386)

## 2020-02-26 LAB — COMPREHENSIVE METABOLIC PANEL
ALT: 30 IU/L (ref 0–44)
AST: 28 IU/L (ref 0–40)
Albumin/Globulin Ratio: 1.1 — ABNORMAL LOW (ref 1.2–2.2)
Albumin: 3.7 g/dL — ABNORMAL LOW (ref 4.0–5.0)
Alkaline Phosphatase: 96 IU/L (ref 44–121)
BUN/Creatinine Ratio: 10 (ref 9–20)
BUN: 16 mg/dL (ref 6–24)
Bilirubin Total: 0.5 mg/dL (ref 0.0–1.2)
CO2: 28 mmol/L (ref 20–29)
Calcium: 9.2 mg/dL (ref 8.7–10.2)
Chloride: 98 mmol/L (ref 96–106)
Creatinine, Ser: 1.56 mg/dL — ABNORMAL HIGH (ref 0.76–1.27)
GFR calc Af Amer: 62 mL/min/{1.73_m2} (ref >59)
GFR calc non Af Amer: 54 mL/min/{1.73_m2} — ABNORMAL LOW (ref >59)
Globulin, Total: 3.4 g/dL (ref 1.5–4.5)
Glucose: 87 mg/dL (ref 65–99)
Potassium: 3.3 mmol/L — ABNORMAL LOW (ref 3.5–5.2)
Sodium: 139 mmol/L (ref 134–144)
Total Protein: 7.1 g/dL (ref 6.0–8.5)

## 2020-02-26 LAB — HEMOGLOBIN A1C
Est. average glucose Bld gHb Est-mCnc: 140 mg/dL
Hgb A1c MFr Bld: 6.5 % — ABNORMAL HIGH (ref 4.8–5.6)

## 2020-02-26 LAB — TISSUE TRANSGLUTAMINASE, IGA: Transglutaminase IgA: <2 U/mL (ref 0–3)

## 2020-02-27 ENCOUNTER — Telehealth: Payer: Self-pay

## 2020-02-27 NOTE — Telephone Encounter (Signed)
Patient was advised and saw results on Mychart as well.

## 2020-02-27 NOTE — Telephone Encounter (Signed)
-----   Message from Trey Sailors, [place] sent at 02/27/2020  1:50 PM EDT ----- Hi [place],   Your kidney function is stable. Your diabetes is stable. Your gluten allergy testing is negative.   Best, Osvaldo Angst, PA-C

## 2020-03-26 ENCOUNTER — Other Ambulatory Visit: Payer: Self-pay | Admitting: Physician Assistant

## 2020-03-26 DIAGNOSIS — M545 Low back pain, unspecified: Secondary | ICD-10-CM

## 2020-04-12 ENCOUNTER — Other Ambulatory Visit: Payer: Self-pay | Admitting: Physician Assistant

## 2020-04-12 DIAGNOSIS — I10 Essential (primary) hypertension: Secondary | ICD-10-CM

## 2020-05-28 ENCOUNTER — Ambulatory Visit: Payer: Self-pay | Admitting: Physician Assistant

## 2020-06-25 ENCOUNTER — Ambulatory Visit: Payer: Self-pay | Admitting: Physician Assistant

## 2020-07-09 ENCOUNTER — Telehealth (INDEPENDENT_AMBULATORY_CARE_PROVIDER_SITE_OTHER): Payer: BC Managed Care – PPO | Admitting: Physician Assistant

## 2020-07-09 DIAGNOSIS — I10 Essential (primary) hypertension: Secondary | ICD-10-CM

## 2020-07-09 DIAGNOSIS — E782 Mixed hyperlipidemia: Secondary | ICD-10-CM

## 2020-07-09 DIAGNOSIS — M545 Low back pain, unspecified: Secondary | ICD-10-CM

## 2020-07-09 DIAGNOSIS — E11319 Type 2 diabetes mellitus with unspecified diabetic retinopathy without macular edema: Secondary | ICD-10-CM

## 2020-07-09 DIAGNOSIS — E1121 Type 2 diabetes mellitus with diabetic nephropathy: Secondary | ICD-10-CM | POA: Diagnosis not present

## 2020-07-09 DIAGNOSIS — E119 Type 2 diabetes mellitus without complications: Secondary | ICD-10-CM

## 2020-07-09 DIAGNOSIS — M5416 Radiculopathy, lumbar region: Secondary | ICD-10-CM

## 2020-07-09 MED ORDER — ATORVASTATIN CALCIUM 80 MG PO TABS: 80.0000 mg | ORAL_TABLET | Freq: Every day | ORAL | 1 refills | Status: DC

## 2020-07-09 MED ORDER — LOSARTAN POTASSIUM 50 MG PO TABS: 50.0000 mg | ORAL_TABLET | Freq: Every day | ORAL | 1 refills | Status: DC

## 2020-07-09 MED ORDER — TRULICITY 1.5 MG/0.5ML ~~LOC~~ SOAJ: 1.5000 mg | SUBCUTANEOUS | 2 refills | Status: DC

## 2020-07-09 MED ORDER — HYDROCHLOROTHIAZIDE 25 MG PO TABS: 25.0000 mg | ORAL_TABLET | Freq: Every day | ORAL | 1 refills | Status: DC

## 2020-07-09 MED ORDER — DULOXETINE HCL 20 MG PO CPEP: ORAL_CAPSULE | ORAL | 1 refills | Status: DC

## 2020-07-09 MED ORDER — GABAPENTIN 600 MG PO TABS: 600.0000 mg | ORAL_TABLET | Freq: Three times a day (TID) | ORAL | 0 refills | Status: DC

## 2020-07-09 NOTE — Progress Notes (Signed)
MyChart Video Visit    Virtual Visit via Video Note   This visit type was conducted due to national recommendations for restrictions regarding the COVID-19 Pandemic (e.g. social distancing) in an effort to limit this patient's exposure and mitigate transmission in our community. This patient is at least at moderate risk for complications without adequate follow up. This format is felt to be most appropriate for this patient at this time. Physical exam was limited by quality of the video and audio technology used for the visit.   Patient location: Home Provider location: Office   I discussed the limitations of evaluation and management by telemedicine and the availability of in person appointments. The patient expressed understanding and agreed to proceed.  Patient: [name] Walker   DOB: 1975-05-29   45 y.o. Male  MRN: [id number] Visit Date: 07/09/2020  Today's healthcare provider: Trey Sailors, PA-C   Chief Complaint  Patient presents with  . Diabetes  . Hyperlipidemia  . Hypertension  . Obesity   Subjective    HPI HPI    Diabetes    Onset: in the past 7 days   Disease course: stable   Current treatment: Trulicity   Compliance with treatment: good    Blurred vision: Absent   Chest pain: Absent   Fatigue: Absent   Foot Ulcerations: Absent   Nausea: Absent   Paresthesia of the feet : Present   Polydipsia : Absent   Polyuria : Absent   Visual changes: Absent   Vomiting: Absent   Weight loss: Absent   Episodes of Hypoglycemia : Absent   Home glucose fasting ranges: Not checked    Blood glucose trend: fluctuating minimally   Eye exam current: is not current (01/21/2019 Retinopathy present )   See podiatrist: does not see a podiatrist   Exercise: rarely exercises   Diet: generally unhealty          Hyperlipidemia    Onset: in the past 7 days   Chronicity: chronic problem   Condition status: controlled   Current therapy: statins   Compliance with treatment:  good    Chest pain: Absent   Chest pressure/discomfort: Absent   Dyspnea: Absent   Focal weakness: Absent   Lower extremity edema: Present   Numbness or tingling of extremity: Present   Orthopnea : Absent   Palpitations: Absent   Paroxysmal nocturnal dyspnea : Absent   Speech difficulty: Absent   Syncope: Absent   Exercise: rarely exercises   Diet: generally unhealty          Hypertension    Chronicity: chronic problem   Onset: in the past 7 days   Condition status: controlled   Agents associated with hypertension: no associated agents   Anxiety: Absent   Blurred vision: Absent   Chest pain: Absent   Chest pressure/discomfort: Absent   Dyspnea: Absent   Headaches: Absent   Lower extremity edema: Present   Orthopnea : Absent   Palpitations: Absent   Paroxysmal nocturnal dyspnea : Absent   Syncope: Absent   Exercise: rarely exercises   Diet: generally unhealty       Last edited by Trey Sailors, PA-C on 07/09/2020  1:19 PM. (History)       Lipid/Cholesterol, Follow-up  Last lipid panel Other pertinent labs  Lab Results  Component Value Date   CHOL 169 08/22/2019   HDL 47 08/22/2019   LDLCALC 97 08/22/2019   TRIG 141 08/22/2019   CHOLHDL 3.6 08/22/2019  Lab Results  Component Value Date   ALT 30 02/24/2020   AST 28 02/24/2020   PLT 133 (L) 12/15/2018   TSH 3.22 03/01/2012      The 10-year ASCVD risk score Denman George DC Jr., et al., 2013) is: 12%  --------------------------------------------------------------------------------------------------- Diabetes Mellitus Type II, Follow-up  Lab Results  Component Value Date   HGBA1C 6.5 (H) 02/24/2020   HGBA1C 6.5 (H) 08/22/2019   HGBA1C 6.2 (H) 04/11/2019   Wt Readings from Last 3 Encounters:  02/24/20 (!) 338 lb 8 oz (153.5 kg)  11/24/19 (!) 353 lb (160.1 kg)  08/22/19 (!) 345 lb 8 oz (156.7 kg)    Pertinent Labs: Lab Results  Component Value Date   CHOL 169 08/22/2019   HDL 47 08/22/2019    LDLCALC 97 08/22/2019   TRIG 141 08/22/2019   CHOLHDL 3.6 08/22/2019   Lab Results  Component Value Date   NA 139 02/24/2020   K 3.3 (L) 02/24/2020   CREATININE 1.56 (H) 02/24/2020   GFRNONAA 54 (L) 02/24/2020   GFRAA 62 02/24/2020   GLUCOSE 87 02/24/2020     --------------------------------------------------------------------------------------------------- Hypertension, follow-up  BP Readings from Last 3 Encounters:  02/24/20 134/80  11/24/19 (!) 188/114  08/22/19 (!) 158/98   Wt Readings from Last 3 Encounters:  02/24/20 (!) 338 lb 8 oz (153.5 kg)  11/24/19 (!) 353 lb (160.1 kg)  08/22/19 (!) 345 lb 8 oz (156.7 kg)      The 10-year ASCVD risk score Denman George DC Jr., et al., 2013) is: 12%   ---------------------------------------------------------------------------------------------------     Medications: Outpatient Medications Prior to Visit  Medication Sig  . atorvastatin (LIPITOR) 80 MG tablet Take 1 tablet (80 mg total) by mouth daily.  . DULoxetine (CYMBALTA) 20 MG capsule TAKE 1 CAPSULE BY MOUTH EVERY DAY  . gabapentin (NEURONTIN) 600 MG tablet TAKE 1 TABLET BY MOUTH THREE TIMES A DAY  . hydrochlorothiazide (HYDRODIURIL) 25 MG tablet TAKE 1 TABLET BY MOUTH EVERY DAY  . HYDROcodone-acetaminophen (NORCO/VICODIN) 5-325 MG tablet Take 1/2 tablet every 12 hours for pain.  Marland Kitchen losartan (COZAAR) 50 MG tablet Take 1 tablet (50 mg total) by mouth daily.  . metoprolol succinate (TOPROL-XL) 50 MG 24 hr tablet TAKE 1 TABLET BY MOUTH ONCE DAILY--TAKE WITH OR IMMEDIATELY FOLLOWING A MEAL  . NON FORMULARY CPAP everynight  . triamcinolone cream (KENALOG) 0.1 % APPLY TO AFFECTED AREA TWICE A DAY  . TRULICITY 1.5 MG/0.5ML SOPN INJECT 1.5 MG INTO THE SKIN ONCE A WEEK.  . vardenafil (LEVITRA) 10 MG tablet Take 1 tablet (10 mg total) by mouth daily as needed for erectile dysfunction.   No facility-administered medications prior to visit.    Review of Systems    Objective    There  were no vitals taken for this visit.   Physical Exam Constitutional:      Appearance: Normal appearance.  Pulmonary:     Effort: Pulmonary effort is normal. No respiratory distress.  Neurological:     Mental Status: He is alert.  Psychiatric:        Mood and Affect: Mood normal.        Behavior: Behavior normal.        Assessment & Plan    1. Diabetes mellitus with nephropathy (HCC)  Continue trulicity. Recheck labs.   - HgB A1c - Lipid Profile - Comprehensive Metabolic Panel (CMET) - Ambulatory referral to Ophthalmology  2. Essential (primary) hypertension  Continue medications.   - HgB A1c - Lipid Profile -  Comprehensive Metabolic Panel (CMET)  3. Morbid obesity (HCC)  Placed bariatric referral one year ago, however patient ultimately did not schedule follow up. Wants to work on lifestyle changes.  4. Mixed hyperlipidemia  Continue statin.  - HgB A1c - Lipid Profile - Comprehensive Metabolic Panel (CMET)  5. Diabetic retinopathy associated with type 2 diabetes mellitus, macular edema presence unspecified, unspecified laterality, unspecified retinopathy severity (HCC)  Refer to ophthalmology.    Return in about 4 weeks (around 08/06/2020).     I discussed the assessment and treatment plan with the patient. The patient was provided an opportunity to ask questions and all were answered. The patient agreed with the plan and demonstrated an understanding of the instructions.   The patient was advised to call back or seek an in-person evaluation if the symptoms worsen or if the condition fails to improve as anticipated.   ITrey Sailors, PA-C, have reviewed all documentation for this visit. The documentation on 07/09/20 for the exam, diagnosis, procedures, and orders are all accurate and complete.  The entirety of the information documented in the History of Present Illness, Review of Systems and Physical Exam were personally obtained by me. Portions of  this information were initially documented by Kavin Leech, CMA and reviewed by me for thoroughness and accuracy.    Maryella Shivers [facility] [phone] (phone) [phone] (fax)  [facility]

## 2020-08-20 ENCOUNTER — Ambulatory Visit: Payer: BC Managed Care – PPO | Admitting: Family Medicine

## 2020-09-27 ENCOUNTER — Ambulatory Visit: Payer: Self-pay | Admitting: *Deleted

## 2020-09-27 NOTE — Telephone Encounter (Signed)
Patient is calling with concerns that he is having trouble staying awake. 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 He states he can be leaning against something at work and he will about fall to sleep. Patient states he sleeps at night- he does use a CPAP and he gets up once to use the bathroom normally. Patient is concerned because he feels he is becoming unsafe. Patient requested appointment today- but there is no availability offered tomorrow- but he states he is working. Advised patient if he feels he is becoming dangerous he needs to be seen- UC advised- patient states he will go.   Reason for Disposition . Nursing judgment  Protocols used: NO GUIDELINE OR REFERENCE AVAILABLE-A-AH

## 2020-10-15 ENCOUNTER — Telehealth: Payer: Self-pay | Admitting: Family Medicine

## 2020-10-15 DIAGNOSIS — I10 Essential (primary) hypertension: Secondary | ICD-10-CM

## 2020-10-15 MED ORDER — METOPROLOL SUCCINATE ER 50 MG PO TB24: ORAL_TABLET | ORAL | 1 refills | Status: DC

## 2020-10-15 NOTE — Telephone Encounter (Signed)
[facility] faxed refill request for the following medications:  metoprolol succinate (TOPROL-XL) 50 MG 24 hr tablet  Last Rx: 02/15/20 Qty: 90 Refills: 1 LOV: 07/09/20 NOV: 11/05/20 with Dr. Leonard Schwartz Please advise. Thanks TNP

## 2020-10-16 ENCOUNTER — Telehealth: Payer: Self-pay | Admitting: Family Medicine

## 2020-10-16 DIAGNOSIS — E119 Type 2 diabetes mellitus without complications: Secondary | ICD-10-CM

## 2020-10-16 MED ORDER — TRULICITY 1.5 MG/0.5ML ~~LOC~~ SOAJ: 1.5000 mg | SUBCUTANEOUS | 0 refills | Status: DC

## 2020-10-16 NOTE — Telephone Encounter (Signed)
[facility] faxed refill request for the following medications:  Dulaglutide (TRULICITY) 1.5 MG/0.5ML SOPN  Last Rx: 07/09/20 Refills: 2 LOV: 07/09/20 Please advise. Thanks TNP

## 2020-10-30 ENCOUNTER — Telehealth: Payer: Self-pay

## 2020-10-30 DIAGNOSIS — M5416 Radiculopathy, lumbar region: Secondary | ICD-10-CM

## 2020-10-30 MED ORDER — GABAPENTIN 600 MG PO TABS: 600.0000 mg | ORAL_TABLET | Freq: Three times a day (TID) | ORAL | 0 refills | Status: DC

## 2020-10-30 NOTE — Telephone Encounter (Signed)
[facility] faxed refill request for the following medications:  gabapentin (NEURONTIN) 600 MG tablet   Please advise.  

## 2020-11-02 ENCOUNTER — Telehealth: Payer: Self-pay

## 2020-11-02 NOTE — Telephone Encounter (Signed)
Contacted patient to cancel his schedule appt with Dr. Beryle Flock on 11/05/20 for follow up, patient states that he only wants to be seen by MD in office, Dr. Beryle Flock has no openings in the month of July or August. Dr. Sullivan Lone does have openings in August and patient states that he can only be seen on Mondays. Would Dr. Sullivan Lone agree to see patient for follow up until he can be seen by Dr. Beryle Flock? KW

## 2020-11-05 ENCOUNTER — Other Ambulatory Visit: Payer: Self-pay

## 2020-11-05 ENCOUNTER — Ambulatory Visit: Payer: Self-pay | Admitting: *Deleted

## 2020-11-05 ENCOUNTER — Encounter: Payer: Self-pay | Admitting: Family Medicine

## 2020-11-05 ENCOUNTER — Ambulatory Visit: Payer: BC Managed Care – PPO | Admitting: Family Medicine

## 2020-11-05 VITALS — BP 137/76 | HR 99 | Temp 98.0°F | Resp 16 | Wt 371.8 lb

## 2020-11-05 DIAGNOSIS — E782 Mixed hyperlipidemia: Secondary | ICD-10-CM

## 2020-11-05 DIAGNOSIS — F321 Major depressive disorder, single episode, moderate: Secondary | ICD-10-CM

## 2020-11-05 DIAGNOSIS — G4733 Obstructive sleep apnea (adult) (pediatric): Secondary | ICD-10-CM

## 2020-11-05 DIAGNOSIS — E119 Type 2 diabetes mellitus without complications: Secondary | ICD-10-CM

## 2020-11-05 DIAGNOSIS — M5416 Radiculopathy, lumbar region: Secondary | ICD-10-CM

## 2020-11-05 LAB — POCT GLYCOSYLATED HEMOGLOBIN (HGB A1C)
Est. average glucose Bld gHb Est-mCnc: 128
Hemoglobin A1C: 6.1 % — AB (ref 4.0–5.6)

## 2020-11-05 MED ORDER — VENLAFAXINE HCL 37.5 MG PO TABS: 37.5000 mg | ORAL_TABLET | Freq: Two times a day (BID) | ORAL | 1 refills | Status: DC

## 2020-11-05 NOTE — Telephone Encounter (Signed)
Left message to call back to advise patient of no other openings. Ok for [facility] to reschedule appt.

## 2020-11-05 NOTE — Progress Notes (Signed)
Depression, Follow-up  He  was last seen for this 4 months ago. Changes made at last visit include no changes.   He reports excellent compliance with treatment. He is not having side effects.   He reports excellent tolerance of treatment. Current symptoms include: depressed mood He feels he is Worse since last visit.  Depression screen [facility] 2/9 11/05/2020 02/24/2020 08/22/2019  Decreased Interest 2 0 0  Down, Depressed, Hopeless 1 0 0  PHQ - 2 Score 3 0 0  Altered sleeping 3 1 -  Tired, decreased energy 2 1 -  Change in appetite 1 0 -  Feeling bad or failure about yourself  3 0 -  Trouble concentrating 1 0 -  Moving slowly or fidgety/restless 1 0 -  Suicidal thoughts 0 0 -  PHQ-9 Score 14 2 -  Difficult doing work/chores Very difficult Not difficult at all -    -----------------------------------------------------------------------------------------      Established patient visit   Patient: [name] Walker   DOB: 02-27-1976   45 y.o. Male  MRN: [id number] Visit Date: 11/05/2020  Today's healthcare provider: Dortha Kern, PA-C   Chief Complaint  Patient presents with   Fatigue   Diabetes   Fall   Subjective    HPI  Depression, Follow-up  He  was last seen for this 2 years ago. Changes made at last visit include patient has been off of Duloxetine.  Current symptoms include: depressed mood, difficulty concentrating, fatigue, feelings of worthlessness/guilt, hopelessness, hypersomnia, and weight gain He feels he is Worse since last visit.  Depression screen [facility] 2/9 11/05/2020 02/24/2020 08/22/2019  Decreased Interest 2 0 0  Down, Depressed, Hopeless 1 0 0  PHQ - 2 Score 3 0 0  Altered sleeping 3 1 -  Tired, decreased energy 2 1 -  Change in appetite 1 0 -  Feeling bad or failure about yourself  3 0 -  Trouble concentrating 1 0 -  Moving slowly or fidgety/restless 1 0 -  Suicidal thoughts 0 0 -  PHQ-9 Score 14 2 -  Difficult doing work/chores Very difficult Not  difficult at all -    ----------------------------------------------------------------------------------------- Diabetes Mellitus Type II, Follow-up  Lab Results  Component Value Date   HGBA1C 6.1 (A) 11/05/2020   HGBA1C 6.5 (H) 02/24/2020   HGBA1C 6.5 (H) 08/22/2019   Wt Readings from Last 3 Encounters:  11/05/20 (!) 371 lb 12.8 oz (168.6 kg)  02/24/20 (!) 338 lb 8 oz (153.5 kg)  11/24/19 (!) 353 lb (160.1 kg)   Last seen for diabetes 4 months ago.  Management since then includes no changes. He reports excellent compliance with treatment. He is not having side effects.  Symptoms: Yes fatigue No foot ulcerations  Yes appetite changes No nausea  Yes paresthesia of the feet  No polydipsia  No polyuria No visual disturbances   No vomiting     Home blood sugar records:  not being checked daily  Episodes of hypoglycemia? No    Current insulin regiment: none Most Recent Eye Exam: not up to date Current exercise: none Current diet habits: in general, an "unhealthy" diet  Pertinent Labs: Lab Results  Component Value Date   CHOL 169 08/22/2019   HDL 47 08/22/2019   LDLCALC 97 08/22/2019   TRIG 141 08/22/2019   CHOLHDL 3.6 08/22/2019   Lab Results  Component Value Date   NA 139 02/24/2020   K 3.3 (L) 02/24/2020   CREATININE 1.56 (H) 02/24/2020   GFRNONAA 54 (  L) 02/24/2020   GFRAA 62 02/24/2020   GLUCOSE 87 02/24/2020     ---------------------------------------------------------------------------------------------------   Patient Active Problem List   Diagnosis Date Noted   Recurrent ventral hernia with incarceration 11/24/2019   Multiple subsegmental pulmonary emboli without acute cor pulmonale (HCC) 08/22/2019   Class 3 severe obesity due to excess calories with body mass index (BMI) of 50.0 to 59.9 in adult (HCC) 02/14/2019   Tobacco use disorder 11/10/2018   Disease characterized by destruction of skeletal muscle 10/29/2018   Cellulitis and abscess of left  lower extremity 10/26/2018   Morbid obesity (HCC) 09/10/2018   History of gout 03/11/2018   Hip pain 08/18/2016   Lumbar radiculopathy 08/18/2016   Chronic bilateral low back pain without sciatica 08/05/2016   Acute anxiety 08/05/2016   Essential (primary) hypertension 10/31/2015   Diabetes mellitus with nephropathy (HCC) 10/31/2015   HLD (hyperlipidemia) 10/31/2015   OSA (obstructive sleep apnea) 10/31/2015   Exomphalos 10/31/2015   Social History   Tobacco Use   Smoking status: Former    Pack years: 0.00    Types: Cigarettes    Quit date: 2020    Years since quitting: 2.5   Smokeless tobacco: Never  Vaping Use   Vaping Use: Never used  Substance Use Topics   Alcohol use: Not Currently   Drug use: Never   Family History  Problem Relation Age of Onset   Diabetes Mother    Cancer Mother        lung   Allergies  Allergen Reactions   Ace Inhibitors Cough   Amlodipine     Pedal edema   Daptomycin Other (See Comments)    Makes CK levels go very high   Iodinated Diagnostic Agents     afftected his kidneys    Metformin And Related     diarrhea   Other     Seasonal allergies        Medications: Outpatient Medications Prior to Visit  Medication Sig   atorvastatin (LIPITOR) 80 MG tablet Take 1 tablet (80 mg total) by mouth daily.   Dulaglutide (TRULICITY) 1.5 MG/0.5ML SOPN Inject 1.5 mg into the skin once a week.   gabapentin (NEURONTIN) 600 MG tablet Take 1 tablet (600 mg total) by mouth 3 (three) times daily.   hydrochlorothiazide (HYDRODIURIL) 25 MG tablet Take 1 tablet (25 mg total) by mouth daily.   losartan (COZAAR) 50 MG tablet Take 1 tablet (50 mg total) by mouth daily.   metoprolol succinate (TOPROL-XL) 50 MG 24 hr tablet Take one tablet daily. Take with or immediately following a meal.   NON FORMULARY CPAP everynight   triamcinolone cream (KENALOG) 0.1 % APPLY TO AFFECTED AREA TWICE A DAY   DULoxetine (CYMBALTA) 20 MG capsule TAKE 1 CAPSULE BY MOUTH EVERY  DAY (Patient not taking: Reported on 11/05/2020)   HYDROcodone-acetaminophen (NORCO/VICODIN) 5-325 MG tablet Take 1/2 tablet every 12 hours for pain. (Patient not taking: Reported on 11/05/2020)   vardenafil (LEVITRA) 10 MG tablet Take 1 tablet (10 mg total) by mouth daily as needed for erectile dysfunction. (Patient not taking: Reported on 11/05/2020)   No facility-administered medications prior to visit.    Review of Systems  Constitutional:  Positive for activity change and fatigue.  Respiratory:  Positive for chest tightness and shortness of breath. Negative for cough.   Cardiovascular:  Negative for chest pain and palpitations.  Psychiatric/Behavioral:  Positive for sleep disturbance. The patient is nervous/anxious.        Objective  BP 137/76 (BP Location: Right Arm, Patient Position: Sitting, Cuff Size: Large)   Pulse 99   Temp 98 F (36.7 C) (Oral)   Resp 16   Wt (!) 371 lb 12.8 oz (168.6 kg)   SpO2 99%   BMI 54.91 kg/m  BP Readings from Last 3 Encounters:  11/05/20 137/76  02/24/20 134/80  11/24/19 (!) 188/114   Wt Readings from Last 3 Encounters:  11/05/20 (!) 371 lb 12.8 oz (168.6 kg)  02/24/20 (!) 338 lb 8 oz (153.5 kg)  11/24/19 (!) 353 lb (160.1 kg)   Physical Exam    Results for orders placed or performed in visit on 11/05/20  POCT glycosylated hemoglobin (Hb A1C)  Result Value Ref Range   Hemoglobin A1C 6.1 (A) 4.0 - 5.6 %   Est. average glucose Bld gHb Est-mCnc 128     Assessment & Plan     1. Depression, major, single episode, moderate (HCC) Spontaneous crying spells during interview. Feeling very fatigued and falls asleep quickly when sitting or standing still. No suicidal ideation. Will check labs for metabolic disorder. Given Venlafaxine 37.5 mg BID and need follow up pending lab reports. - CBC with Differential/Platelet - Comprehensive metabolic panel - TSH - venlafaxine (EFFEXOR) 37.5 MG tablet; Take 1 tablet (37.5 mg total) by mouth 2 (two)  times daily.  Dispense: 60 tablet; Refill: 1  2. Diabetes mellitus without complication (HCC) Hgb A1C 6.1% today. Still taking Trulicity 1.5 mg injection q week. Questionable neuropathy in the left lower leg from lumbar radiculopathy. Recheck labs. - POCT glycosylated hemoglobin (Hb A1C) - CBC with Differential/Platelet - Comprehensive metabolic panel - Lipid panel  3. Morbid obesity (HCC) BMI nearly 55 today. Continue to work on weight loss diet and recheck labs. - CBC with Differential/Platelet - Comprehensive metabolic panel - Lipid panel - TSH  4. Mixed hyperlipidemia Tolerating Atorvastatin 80 mg qd. Must lose some weight and follow low fat diet. Recheck labs. - CBC with Differential/Platelet - Comprehensive metabolic panel - Lipid panel - TSH  5. Lumbar radiculopathy Some sciatica into the left leg with low back pain. Severe obesity probable main cause. Must lose weight. Taking Tylenol Arthritis and Gabapentin for chronic pain. May need to consider follow up with Dr. Yves Dill (pain management).  6. OSA (obstructive sleep apnea) Must continue to use CPAP whenever he sleeps - day or night - to help with fatigue and daytime drowsiness. May need pulmonary referral and possible repeat sleep study if no better.   No follow-ups on file.      I, Jerrick Farve, PA-C, have reviewed all documentation for this visit. The documentation on 11/05/20 for the exam, diagnosis, procedures, and orders are all accurate and complete.    Dortha Kern, PA-C  [facility] [employer] [phone] (phone) [phone] (fax)  [facility]

## 2020-11-05 NOTE — Telephone Encounter (Signed)
Patient's wife is calling with concerns about husband- patient is falling asleep at work- while standing, in chair. Patient states he does not feel well- not active- always tired. Patient is there with her- patient states she has sleep apnea- not using his CPAP every night. Call to office- they took call to schedule.  Reason for Disposition  [1] MODERATE weakness (i.e., interferes with work, school, normal activities) AND [2] cause unknown  (Exceptions: weakness with acute minor illness, or weakness from poor fluid intake)  Answer Assessment - Initial Assessment Questions 1. DESCRIPTION: "Describe how you are feeling."     Fatigue- does not feel well 2. SEVERITY: "How bad is it?"  "Can you stand and walk?"   - MILD - Feels weak or tired, but does not interfere with work, school or normal activities   - MODERATE - Able to stand and walk; weakness interferes with work, school, or normal activities   - SEVERE - Unable to stand or walk     moderate 3. ONSET:  "When did the weakness begin?"     1 month ago 4. CAUSE: "What do you think is causing the weakness?"     unsure 5. MEDICINES: "Have you recently started a new medicine or had a change in the amount of a medicine?"     No changes 6. OTHER SYMPTOMS: "Do you have any other symptoms?" (e.g., chest pain, fever, cough, SOB, vomiting, diarrhea, bleeding, other areas of pain)     Stomach pain and bloating- 1 week ago 7. PREGNANCY: "Is there any chance you are pregnant?" "When was your last menstrual period?"     N/a  Protocols used: Weakness (Generalized) and Fatigue-A-AH

## 2020-11-05 NOTE — Telephone Encounter (Signed)
Please advise. Can you see patient? Dr. Leonard Schwartz has no availabilities in August and he can only do Mondays.

## 2020-11-06 NOTE — Telephone Encounter (Signed)
Patient was seen in office 11/05/20 and evaluated by Dortha Kern, PA-C

## 2020-11-08 LAB — COMPREHENSIVE METABOLIC PANEL
ALT: 17 IU/L (ref 0–44)
AST: 23 IU/L (ref 0–40)
Albumin/Globulin Ratio: 1 — ABNORMAL LOW (ref 1.2–2.2)
Albumin: 3 g/dL — ABNORMAL LOW (ref 4.0–5.0)
Alkaline Phosphatase: 100 IU/L (ref 44–121)
BUN/Creatinine Ratio: 6 — ABNORMAL LOW (ref 9–20)
BUN: 13 mg/dL (ref 6–24)
Bilirubin Total: 0.3 mg/dL (ref 0.0–1.2)
CO2: 28 mmol/L (ref 20–29)
Calcium: 8.9 mg/dL (ref 8.7–10.2)
Chloride: 99 mmol/L (ref 96–106)
Creatinine, Ser: 2.15 mg/dL — ABNORMAL HIGH (ref 0.76–1.27)
Globulin, Total: 3.1 g/dL (ref 1.5–4.5)
Glucose: 129 mg/dL — ABNORMAL HIGH (ref 65–99)
Potassium: 4.2 mmol/L (ref 3.5–5.2)
Sodium: 143 mmol/L (ref 134–144)
Total Protein: 6.1 g/dL (ref 6.0–8.5)
eGFR: 38 mL/min/{1.73_m2} — ABNORMAL LOW (ref >59)

## 2020-11-08 LAB — CBC WITH DIFFERENTIAL/PLATELET
Basophils Absolute: 0 10*3/uL (ref 0.0–0.2)
Basos: 0 %
EOS (ABSOLUTE): 0.1 10*3/uL (ref 0.0–0.4)
Eos: 2 %
Hematocrit: 49.3 % (ref 37.5–51.0)
Hemoglobin: 16.3 g/dL (ref 13.0–17.7)
Immature Grans (Abs): 0 10*3/uL (ref 0.0–0.1)
Immature Granulocytes: 0 %
Lymphocytes Absolute: 1.3 10*3/uL (ref 0.7–3.1)
Lymphs: 27 %
MCH: 30 pg (ref 26.6–33.0)
MCHC: 33.1 g/dL (ref 31.5–35.7)
MCV: 91 fL (ref 79–97)
Monocytes Absolute: 0.5 10*3/uL (ref 0.1–0.9)
Monocytes: 10 %
Neutrophils Absolute: 2.8 10*3/uL (ref 1.4–7.0)
Neutrophils: 61 %
Platelets: 177 10*3/uL (ref 150–450)
RBC: 5.43 x10E6/uL (ref 4.14–5.80)
RDW: 15.1 % (ref 11.6–15.4)
WBC: 4.7 10*3/uL (ref 3.4–10.8)

## 2020-11-08 LAB — LIPID PANEL
Chol/HDL Ratio: 3.4 ratio (ref 0.0–5.0)
Cholesterol, Total: 192 mg/dL (ref 100–199)
HDL: 56 mg/dL (ref >39)
LDL Chol Calc (NIH): 114 mg/dL — ABNORMAL HIGH (ref 0–99)
Triglycerides: 124 mg/dL (ref 0–149)
VLDL Cholesterol Cal: 22 mg/dL (ref 5–40)

## 2020-11-08 LAB — TSH: TSH: 0.842 u[IU]/mL (ref 0.450–4.500)

## 2020-11-08 IMAGING — CR LUMBAR SPINE - 2-3 VIEW
1 series · 3 of 3 positions shown · non-contrast
Comparison: Chest x-ray 01/19/2014

CLINICAL DATA: Leg pain, mid low back pain for 3-4 days

EXAM:
LUMBAR SPINE - 2-3 VIEW

[Series 1: dg lumbar spine 2-3 views · 0.14mm/px · 3 of 3 slices shown]
[im 1/3]
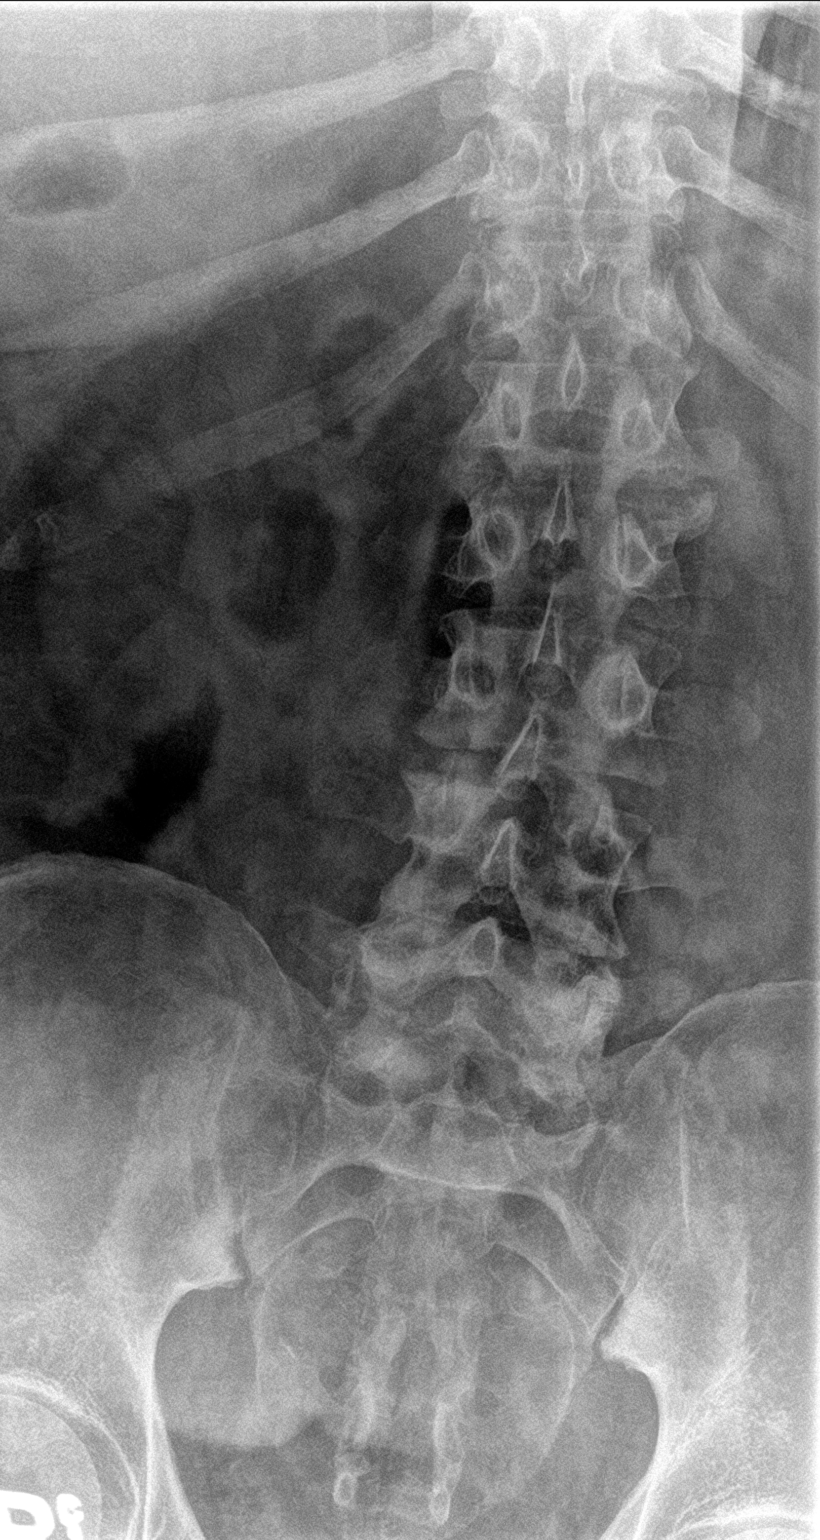
[im 2/3]
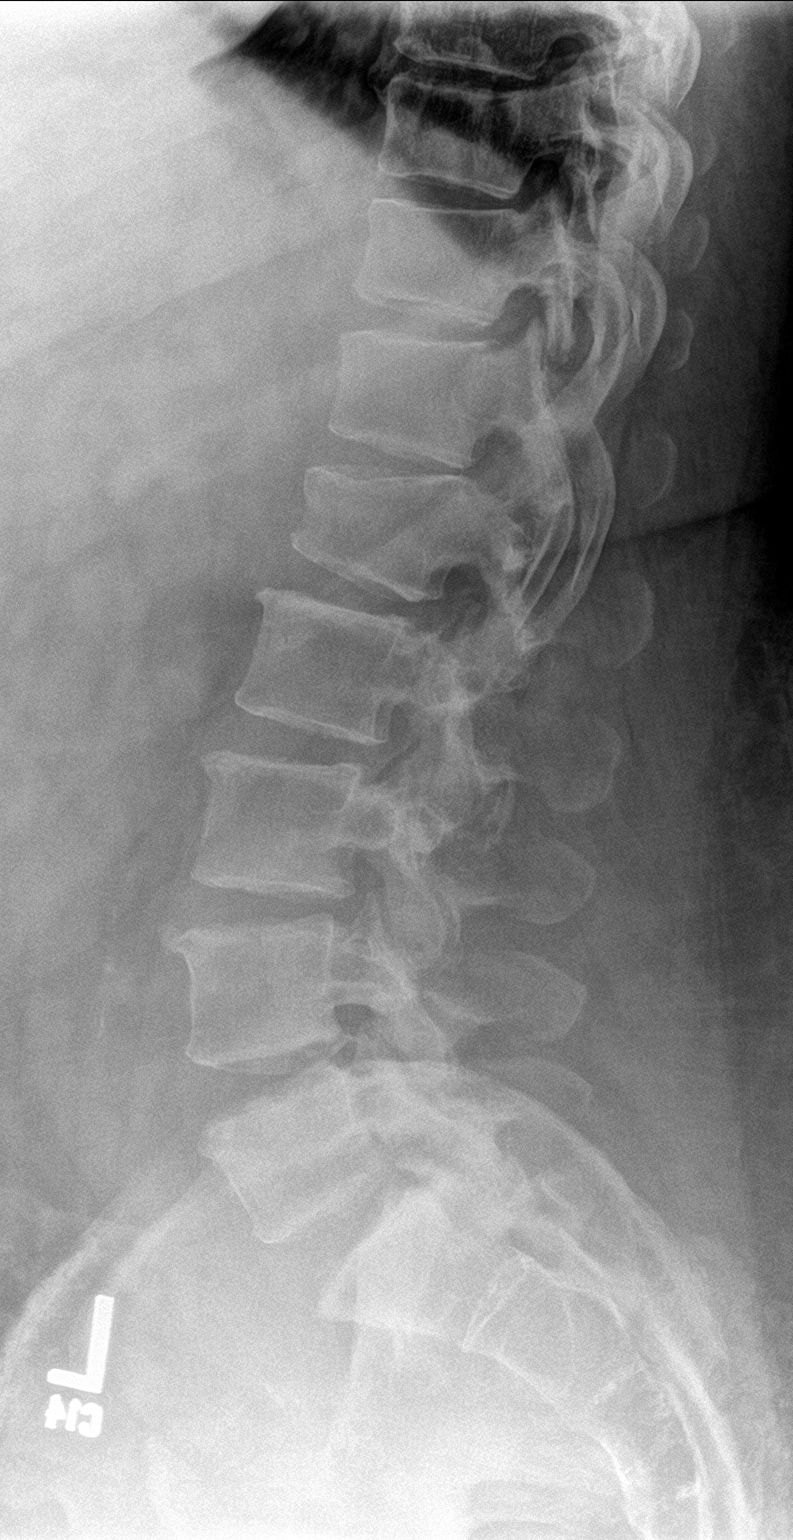
[im 3/3]
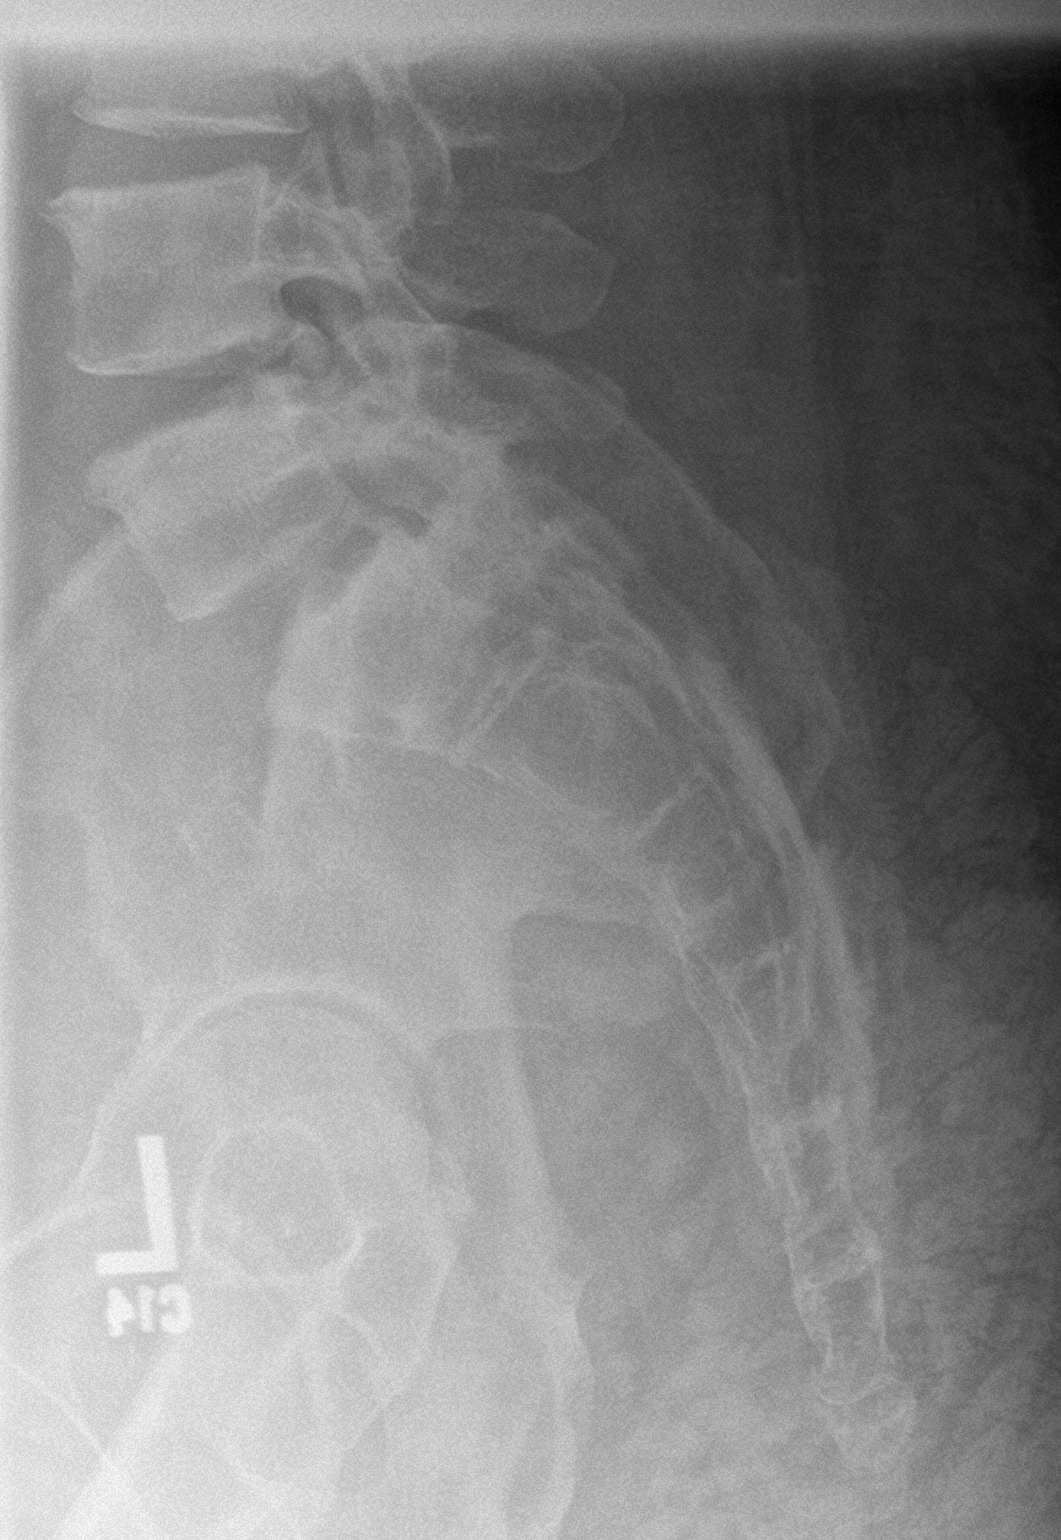

[3 of 3 positions shown; findings below may reference images not displayed]

FINDINGS: There are 5 nonrib bearing lumbar-type vertebral bodies.

There is an age-indeterminate L1 vertebral body compression fracture
with approximately 50% anterior height loss. The remainder the
vertebral body heights are maintained.

1-2 mm retrolisthesis of L2 on L3 and L3 on L4. There is no
spondylolysis.

There is degenerative disease with disc height loss at L3-4, L4-5
and L5-S1. There is bilateral facet arthropathy at L4-5 and L5-S1.

The SI joints are unremarkable.
IMPRESSION: 1. Age-indeterminate L1 vertebral body compression fracture with 50%
anterior height loss.

## 2020-11-08 IMAGING — CT CT LUMBAR SPINE WITHOUT CONTRAST
3 series · 13 of 33 positions shown, 16 images · non-contrast
Comparison: Radiography from earlier today

CLINICAL DATA: Back pain without red flag symptoms.

EXAM:
CT LUMBAR SPINE WITHOUT CONTRAST
TECHNIQUE: Multidetector CT imaging of the lumbar spine was performed without
intravenous contrast administration. Multiplanar CT image
reconstructions were also generated.

[Series 4: l spine soft · axial · 0.32mm/px · z∈[-1092,-920]mm · 5 of 126 slices shown, 7 images]
[im 20/126  soft-tissue]
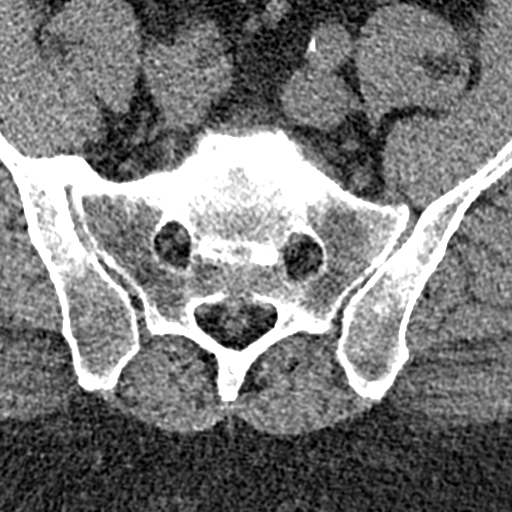
[im 20/126  bone]
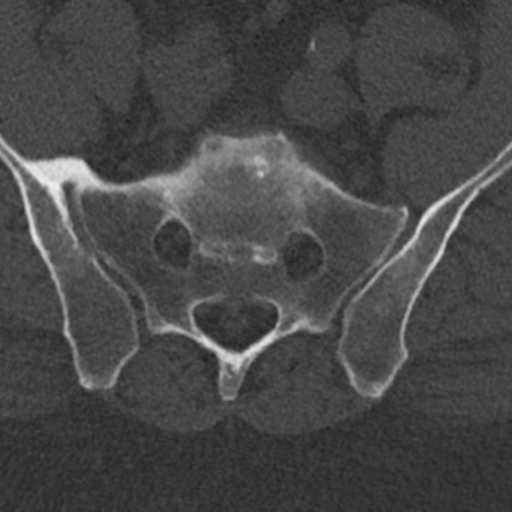
[im 39/126  bone]
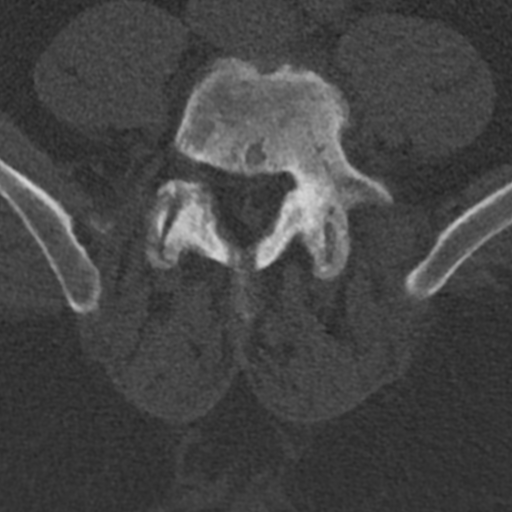
[im 68/126  bone]
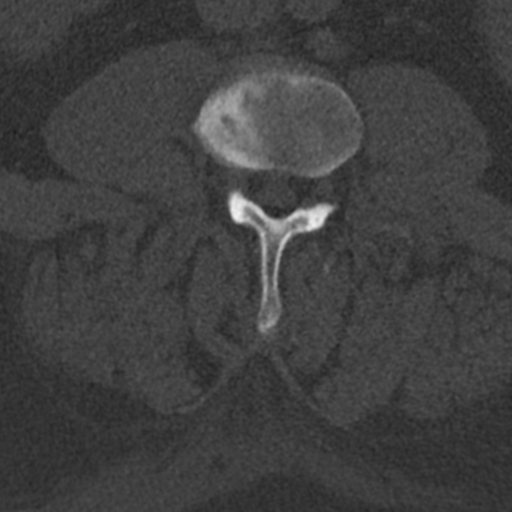
[im 87/126  bone]
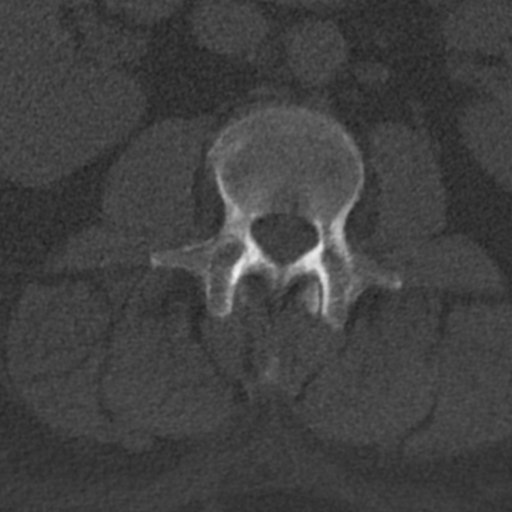
[im 106/126  soft-tissue]
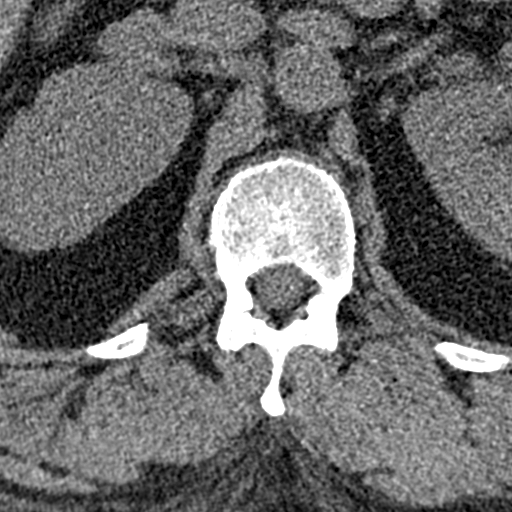
[im 106/126  bone]
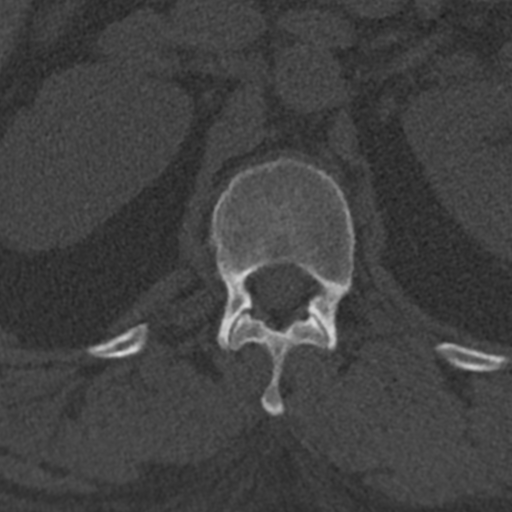

[Series 7: sagittal bone · sagittal · 0.35mm/px · 5 of 77 slices shown, 6 images]
[im 26/77  bone]
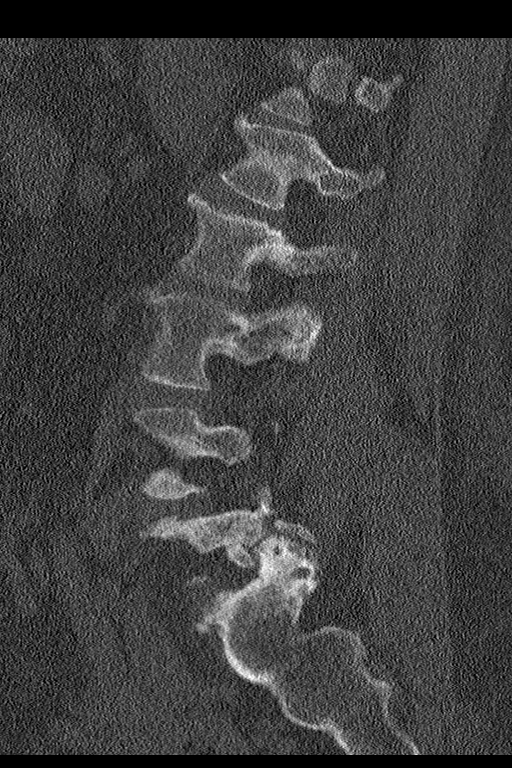
[im 32/77  bone]
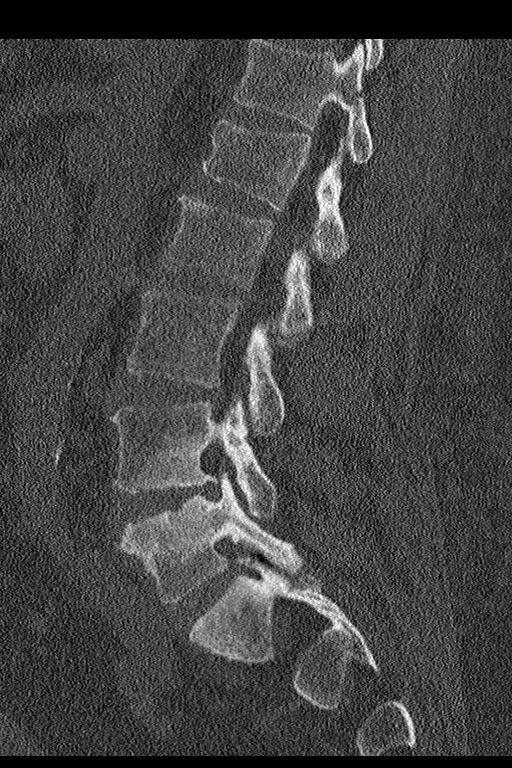
[im 39/77  soft-tissue]
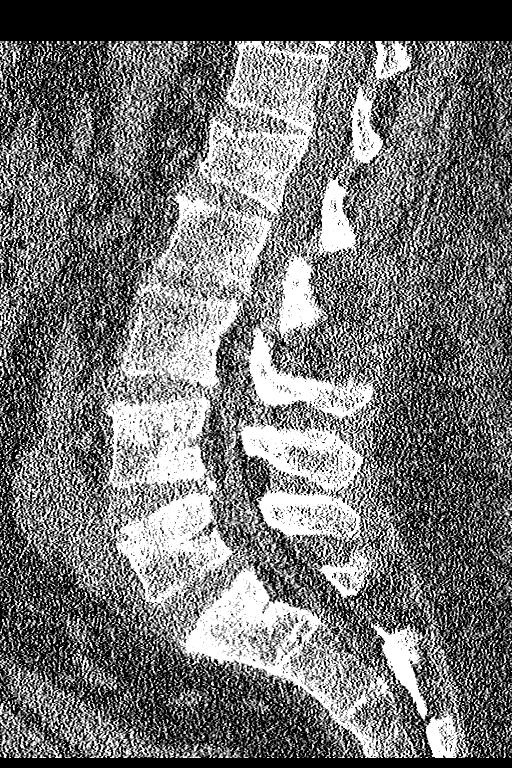
[im 39/77  bone]
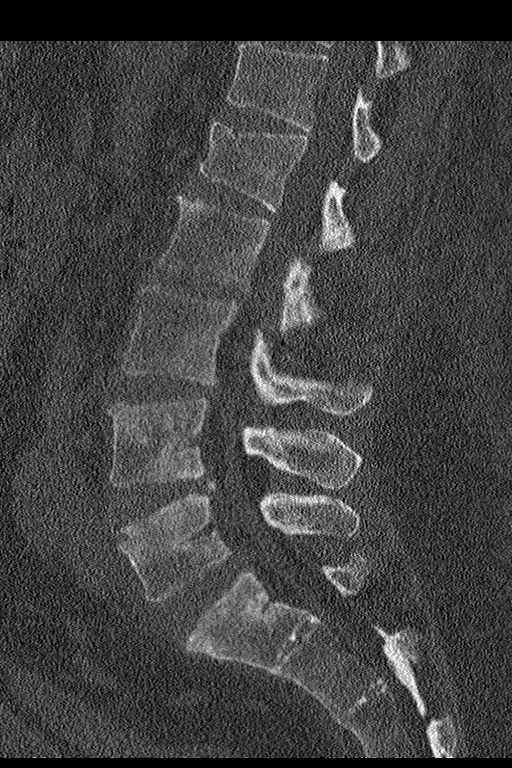
[im 45/77  bone]
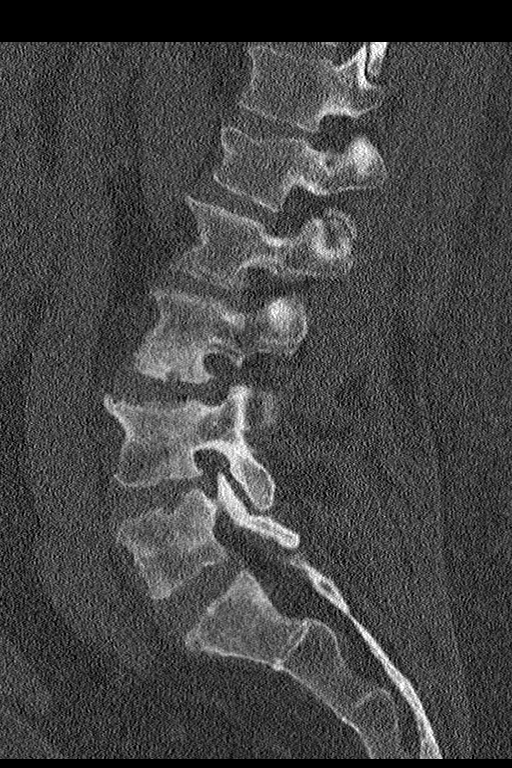
[im 51/77  bone]
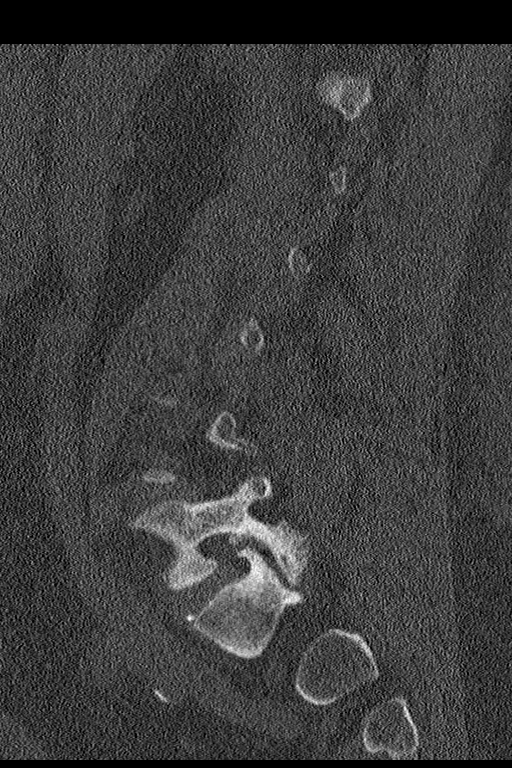

[Series 8: coronal bone · coronal · 0.51mm/px · 3 of 208 slices shown]
[im 70/208  bone]
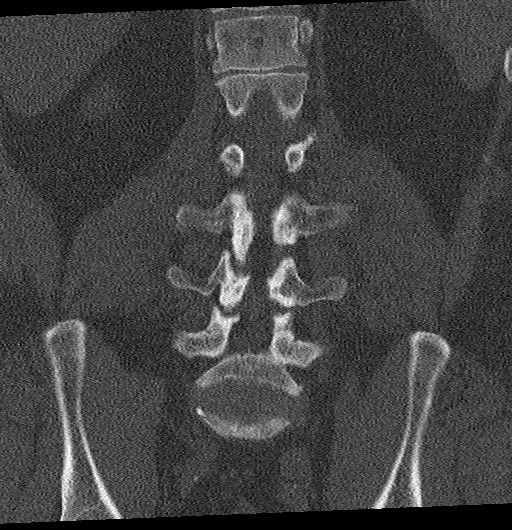
[im 93/208  bone]
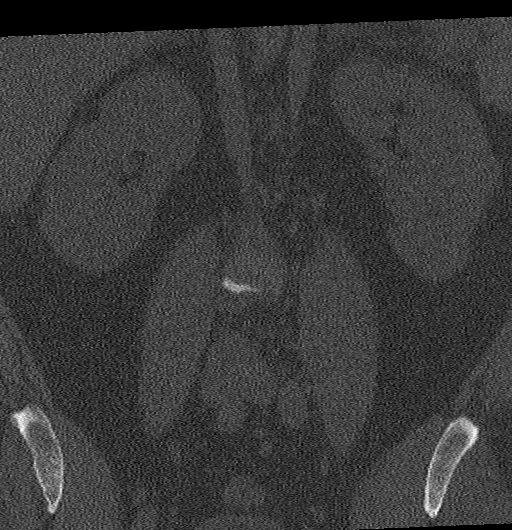
[im 116/208  bone]
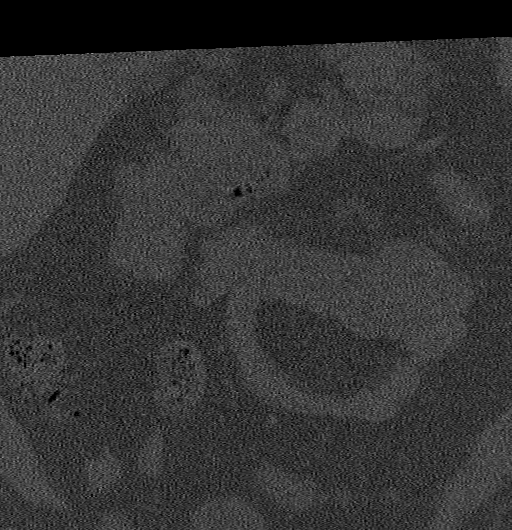

[13 of 33 positions shown; findings below may reference images not displayed]

FINDINGS: Segmentation: 5 lumbar type vertebrae

Alignment: Normal.

Vertebrae: Degenerative appearing sclerosis in the L3-S1 vertebrae.
Remote L1 compression fracture with mild anterior wedging. Bilateral
sacroiliac irregularity and sclerosis.

Paraspinal and other soft tissues: Arterial calcification.

Disc levels:

L1-L2: Facet spurring and endplate spurring.  No impingement

L2-L3: Facet spurring and disc bulging with small endplate spurs.

L3-L4: Disc narrowing and bulging with asymmetric right-sided
endplate sclerosis. There is epidural fat effacement with moderate
thecal sac narrowing. Mild or moderate right foraminal narrowing

L4-L5: Complete effacement of thecal sac by epidural fat. Asymmetric
leftward disc narrowing and endplate degeneration with bulge. Patent
foramina

L5-S1:Disc narrowing and bulging that is mild. Posterior element
hypertrophy. Thecal sac is completely effaced by epidural fat.
Moderate left foraminal narrowing.
IMPRESSION: 1. Zhe4X compression fracture is remote.  No acute finding.
2. Epidural lipomatosis with thecal sac effacement from L3-4 to
L5-S1.
3. Disc and facet degeneration as described.
4. Chronic/remote sacroiliitis on both sides.

## 2020-11-19 ENCOUNTER — Other Ambulatory Visit: Payer: Self-pay | Admitting: Family Medicine

## 2020-11-19 DIAGNOSIS — E119 Type 2 diabetes mellitus without complications: Secondary | ICD-10-CM

## 2020-11-28 ENCOUNTER — Other Ambulatory Visit: Payer: Self-pay | Admitting: Family Medicine

## 2020-11-28 DIAGNOSIS — F321 Major depressive disorder, single episode, moderate: Secondary | ICD-10-CM

## 2021-01-18 ENCOUNTER — Telehealth: Payer: Self-pay

## 2021-01-18 DIAGNOSIS — I10 Essential (primary) hypertension: Secondary | ICD-10-CM

## 2021-01-18 DIAGNOSIS — E782 Mixed hyperlipidemia: Secondary | ICD-10-CM

## 2021-01-18 MED ORDER — ATORVASTATIN CALCIUM 80 MG PO TABS: 80.0000 mg | ORAL_TABLET | Freq: Every day | ORAL | 1 refills | Status: DC

## 2021-01-18 MED ORDER — LOSARTAN POTASSIUM 50 MG PO TABS: 50.0000 mg | ORAL_TABLET | Freq: Every day | ORAL | 1 refills | Status: DC

## 2021-01-18 NOTE — Telephone Encounter (Signed)
[facility] faxed refill request for the following medications:  atorvastatin (LIPITOR) 80 MG tablet   losartan (COZAAR) 50 MG tablet  Please advise.

## 2021-01-21 ENCOUNTER — Other Ambulatory Visit: Payer: Self-pay

## 2021-01-21 DIAGNOSIS — I10 Essential (primary) hypertension: Secondary | ICD-10-CM

## 2021-01-21 MED ORDER — HYDROCHLOROTHIAZIDE 25 MG PO TABS: 25.0000 mg | ORAL_TABLET | Freq: Every day | ORAL | 1 refills | Status: DC

## 2021-01-21 NOTE — Telephone Encounter (Signed)
LOV: 11/05/20 Last refill: 07/09/2020 Future appt. none

## 2021-01-21 NOTE — Telephone Encounter (Signed)
[facility] faxed refill request for the following medications:  hydrochlorothiazide (HYDRODIURIL) 25 MG tablet  Please advise.  

## 2021-01-25 ENCOUNTER — Other Ambulatory Visit: Payer: Self-pay | Admitting: Family Medicine

## 2021-01-25 DIAGNOSIS — M5416 Radiculopathy, lumbar region: Secondary | ICD-10-CM

## 2021-04-06 ENCOUNTER — Other Ambulatory Visit: Payer: Self-pay | Admitting: Family Medicine

## 2021-04-06 DIAGNOSIS — I10 Essential (primary) hypertension: Secondary | ICD-10-CM

## 2021-04-06 NOTE — Telephone Encounter (Signed)
Requested Prescriptions  Pending Prescriptions Disp Refills  . metoprolol succinate (TOPROL-XL) 50 MG 24 hr tablet [Pharmacy Med Name: METOPROLOL SUCC ER 50 MG TAB] 30 tablet 0    Sig: TAKE ONE TABLET DAILY. TAKE WITH OR IMMEDIATELY FOLLOWING A MEAL.     Cardiovascular:  Beta Blockers Failed - 04/06/2021  4:14 PM      Failed - Valid encounter within last 6 months    Recent Outpatient Visits          5 months ago Depression, major, single episode, moderate (HCC)   [facility] Chrismon, Jodell Cipro, PA-C   9 months ago Diabetes mellitus with nephropathy [facility])   [facility] [place], [place], [place]   1 year ago Right foot pain   [facility] [place], [place], [place]   1 year ago Essential (primary) hypertension   [facility] Osvaldo Angst M, [place]   1 year ago Erectile dysfunction, unspecified erectile dysfunction type   [facility], Adriana M, PA-C             Passed - Last BP in normal range    BP Readings from Last 1 Encounters:  11/05/20 137/76         Passed - Last Heart Rate in normal range    Pulse Readings from Last 1 Encounters:  11/05/20 99

## 2021-04-22 ENCOUNTER — Other Ambulatory Visit: Payer: Self-pay | Admitting: Family Medicine

## 2021-04-22 DIAGNOSIS — I10 Essential (primary) hypertension: Secondary | ICD-10-CM

## 2021-04-23 NOTE — Telephone Encounter (Signed)
Requested Prescriptions  Pending Prescriptions Disp Refills   metoprolol succinate (TOPROL-XL) 50 MG 24 hr tablet [Pharmacy Med Name: METOPROLOL SUCC ER 50 MG TAB] 90 tablet 1    Sig: TAKE ONE TABLET DAILY. TAKE WITH OR IMMEDIATELY FOLLOWING A MEAL.     Cardiovascular:  Beta Blockers Failed - 04/22/2021 11:20 AM      Failed - Valid encounter within last 6 months    Recent Outpatient Visits          5 months ago Depression, major, single episode, moderate (HCC)   [facility] Chrismon, Jodell Cipro, PA-C   9 months ago Diabetes mellitus with nephropathy [facility])   [facility] [place], [place], [place]   1 year ago Right foot pain   [facility] [place], [place], [place]   1 year ago Essential (primary) hypertension   [facility] Osvaldo Angst M, [place]   1 year ago Erectile dysfunction, unspecified erectile dysfunction type   [facility], Adriana M, PA-C             Passed - Last BP in normal range    BP Readings from Last 1 Encounters:  11/05/20 137/76         Passed - Last Heart Rate in normal range    Pulse Readings from Last 1 Encounters:  11/05/20 99         Courtesy refill. Patient will need an office appointment for further refills.

## 2021-05-08 ENCOUNTER — Other Ambulatory Visit: Payer: Self-pay | Admitting: Family Medicine

## 2021-05-08 DIAGNOSIS — I10 Essential (primary) hypertension: Secondary | ICD-10-CM

## 2021-05-08 NOTE — Telephone Encounter (Signed)
Pharmacy request changes in Rx- courtesy RF Requested Prescriptions  Pending Prescriptions Disp Refills   metoprolol succinate (TOPROL-XL) 50 MG 24 hr tablet [Pharmacy Med Name: METOPROLOL SUCC ER 50 MG TAB] 90 tablet 1    Sig: TAKE ONE TABLET DAILY. TAKE WITH OR IMMEDIATELY FOLLOWING A MEAL.     Cardiovascular:  Beta Blockers Failed - 05/08/2021  8:42 AM      Failed - Valid encounter within last 6 months    Recent Outpatient Visits          6 months ago Depression, major, single episode, moderate (HCC)   [facility] Chrismon, Jodell Cipro, PA-C   10 months ago Diabetes mellitus with nephropathy [facility])   [facility] [place], [place], [place]   1 year ago Right foot pain   [facility] [place], [place], [place]   1 year ago Essential (primary) hypertension   [facility] Osvaldo Angst M, [place]   1 year ago Erectile dysfunction, unspecified erectile dysfunction type   [facility], Adriana M, PA-C             Passed - Last BP in normal range    BP Readings from Last 1 Encounters:  11/05/20 137/76         Passed - Last Heart Rate in normal range    Pulse Readings from Last 1 Encounters:  11/05/20 99

## 2021-05-24 ENCOUNTER — Other Ambulatory Visit: Payer: Self-pay | Admitting: Pulmonary Disease

## 2021-05-24 DIAGNOSIS — E662 Morbid (severe) obesity with alveolar hypoventilation: Secondary | ICD-10-CM

## 2021-05-27 ENCOUNTER — Ambulatory Visit: Payer: Self-pay | Attending: Pulmonary Disease

## 2021-05-27 DIAGNOSIS — Z6841 Body Mass Index (BMI) 40.0 and over, adult: Secondary | ICD-10-CM | POA: Insufficient documentation

## 2021-05-27 DIAGNOSIS — E662 Morbid (severe) obesity with alveolar hypoventilation: Secondary | ICD-10-CM | POA: Insufficient documentation

## 2021-05-27 LAB — BLOOD GAS, ARTERIAL
Acid-Base Excess: 10.6 mmol/L — ABNORMAL HIGH (ref 0.0–2.0)
Bicarbonate: 36.8 mmol/L — ABNORMAL HIGH (ref 20.0–28.0)
FIO2: 0.21
O2 Saturation: 91.8 %
Patient temperature: 37
pCO2 arterial: 53 mmHg — ABNORMAL HIGH (ref 32.0–48.0)
pH, Arterial: 7.45 (ref 7.350–7.450)
pO2, Arterial: 60 mmHg — ABNORMAL LOW (ref 83.0–108.0)

## 2021-06-30 ENCOUNTER — Other Ambulatory Visit: Payer: Self-pay | Admitting: Family Medicine

## 2021-06-30 DIAGNOSIS — I10 Essential (primary) hypertension: Secondary | ICD-10-CM

## 2021-07-17 ENCOUNTER — Other Ambulatory Visit: Payer: Self-pay | Admitting: Family Medicine

## 2021-07-17 DIAGNOSIS — I10 Essential (primary) hypertension: Secondary | ICD-10-CM

## 2021-07-22 ENCOUNTER — Other Ambulatory Visit: Payer: Self-pay | Admitting: Family Medicine

## 2021-07-22 DIAGNOSIS — I10 Essential (primary) hypertension: Secondary | ICD-10-CM

## 2021-08-07 ENCOUNTER — Other Ambulatory Visit: Payer: Self-pay | Admitting: Family Medicine

## 2021-08-07 DIAGNOSIS — I10 Essential (primary) hypertension: Secondary | ICD-10-CM

## 2021-08-08 NOTE — Telephone Encounter (Signed)
Refused metoprolol refill request.   No longer a pt with [facility].  Has established with Dr. Frazier Richards. ?

## 2021-09-02 ENCOUNTER — Other Ambulatory Visit: Payer: Self-pay | Admitting: Family Medicine

## 2021-09-02 DIAGNOSIS — I10 Essential (primary) hypertension: Secondary | ICD-10-CM

## 2021-09-26 ENCOUNTER — Other Ambulatory Visit: Payer: Self-pay | Admitting: Family Medicine

## 2021-09-26 DIAGNOSIS — I10 Essential (primary) hypertension: Secondary | ICD-10-CM

## 2021-10-14 ENCOUNTER — Other Ambulatory Visit: Payer: Self-pay | Admitting: Orthopedic Surgery

## 2021-10-14 ENCOUNTER — Other Ambulatory Visit: Payer: Self-pay | Admitting: Family Medicine

## 2021-10-14 DIAGNOSIS — I10 Essential (primary) hypertension: Secondary | ICD-10-CM

## 2021-10-14 DIAGNOSIS — S32010A Wedge compression fracture of first lumbar vertebra, initial encounter for closed fracture: Secondary | ICD-10-CM

## 2021-10-14 DIAGNOSIS — M5416 Radiculopathy, lumbar region: Secondary | ICD-10-CM

## 2021-10-14 DIAGNOSIS — M4306 Spondylolysis, lumbar region: Secondary | ICD-10-CM

## 2021-10-28 ENCOUNTER — Ambulatory Visit
Admission: RE | Admit: 2021-10-28 | Discharge: 2021-10-28 | Disposition: A | Discharge status: Home / Self Care | Payer: No Typology Code available for payment source | Source: Ambulatory Visit | Attending: Orthopedic Surgery | Admitting: Orthopedic Surgery

## 2021-10-28 DIAGNOSIS — S32010A Wedge compression fracture of first lumbar vertebra, initial encounter for closed fracture: Secondary | ICD-10-CM | POA: Diagnosis present

## 2021-10-28 DIAGNOSIS — M4306 Spondylolysis, lumbar region: Secondary | ICD-10-CM | POA: Diagnosis present

## 2021-10-28 DIAGNOSIS — M5416 Radiculopathy, lumbar region: Secondary | ICD-10-CM | POA: Insufficient documentation

## 2021-12-09 ENCOUNTER — Emergency Department
Admission: EM | Admit: 2021-12-09 | Discharge: 2021-12-09 | Discharge status: Against Medical Advice | Payer: No Typology Code available for payment source | Source: Home / Self Care

## 2021-12-25 ENCOUNTER — Other Ambulatory Visit: Payer: Self-pay | Admitting: Family Medicine

## 2021-12-25 DIAGNOSIS — I10 Essential (primary) hypertension: Secondary | ICD-10-CM

## 2022-01-17 ENCOUNTER — Other Ambulatory Visit: Payer: Self-pay | Admitting: Family Medicine

## 2022-01-17 DIAGNOSIS — I10 Essential (primary) hypertension: Secondary | ICD-10-CM

## 2022-02-05 ENCOUNTER — Other Ambulatory Visit: Payer: Self-pay | Admitting: Family Medicine

## 2022-02-05 DIAGNOSIS — M5416 Radiculopathy, lumbar region: Secondary | ICD-10-CM

## 2022-02-05 DIAGNOSIS — I10 Essential (primary) hypertension: Secondary | ICD-10-CM

## 2022-02-05 NOTE — Telephone Encounter (Signed)
Requested medication (s) are due for refill today: expired medication  Requested medication (s) are on the active medication list: yes  Last refill:  01/25/21 #270 2 refills  Future visit scheduled: no  Notes to clinic:  expired medication. Called patient to schedule appt for medication refills. No answer, LVMTCB do you want to renew Rx?     Requested Prescriptions  Pending Prescriptions Disp Refills   gabapentin (NEURONTIN) 600 MG tablet [Pharmacy Med Name: GABAPENTIN 600 MG TABLET] 90 tablet 8    Sig: TAKE 1 TABLET BY MOUTH THREE TIMES A DAY     Neurology: Anticonvulsants - gabapentin Failed - 02/05/2022 10:24 AM      Failed - Cr in normal range and within 360 days    Creatinine  Date Value Ref Range Status  03/31/2013 0.83 0.60 - 1.30 mg/dL Final   Creatinine, Ser  Date Value Ref Range Status  11/07/2020 2.15 (H) 0.76 - 1.27 mg/dL Final         Failed - Completed PHQ-2 or PHQ-9 in the last 360 days      Failed - Valid encounter within last 12 months    Recent Outpatient Visits           1 year ago Depression, major, single episode, moderate ([place])   [employer], Vickki Muff, PA-C   1 year ago Diabetes mellitus with nephropathy [facility])   [place], [place], [place]   1 year ago Right foot pain   [facility] [place], Fabio Bering M, [place]   2 years ago Essential (primary) hypertension   [employer], [place], [place]   2 years ago Erectile dysfunction, unspecified erectile dysfunction type   [facility] [place], Wendee [place], [place]

## 2022-02-05 NOTE — Telephone Encounter (Signed)
Called patient to schedule appt for medication refills/APE. No answer, LVMTCB [phone]

## 2022-02-06 NOTE — Telephone Encounter (Signed)
Requested medication (s) are due for refill today: yes  Requested medication (s) are on the active medication list: yes  Last refill:  01/17/21  Future visit scheduled: no  Notes to clinic:  Unable to refill per protocol, last refill by provider that is no longer with the practice, routing for review.     Requested Prescriptions  Pending Prescriptions Disp Refills   metoprolol succinate (TOPROL-XL) 50 MG 24 hr tablet [Pharmacy Med Name: METOPROLOL SUCC ER 50 MG TAB] 30 tablet 0    Sig: TAKE ONE TABLET DAILY. TAKE WITH OR IMMEDIATELY FOLLOWING A MEAL.     Cardiovascular:  Beta Blockers Failed - 02/05/2022 12:44 PM      Failed - Valid encounter within last 6 months    Recent Outpatient Visits           1 year ago Depression, major, single episode, moderate ([place])   [place], Vickki Muff, PA-C   1 year ago Diabetes mellitus with nephropathy [facility])   [facility] [place], [place], [place]   1 year ago Right foot pain   [facility] Carles Collet M, [place]   2 years ago Essential (primary) hypertension   [employer], Adriana M, [place]   2 years ago Erectile dysfunction, unspecified erectile dysfunction type   [facility], Adriana M, PA-C              Passed - Last BP in normal range    BP Readings from Last 1 Encounters:  11/05/20 137/76         Passed - Last Heart Rate in normal range    Pulse Readings from Last 1 Encounters:  11/05/20 99

## 2022-05-05 ENCOUNTER — Ambulatory Visit
Admission: RE | Admit: 2022-05-05 | Discharge: 2022-05-05 | Disposition: A | Discharge status: Home / Self Care | Payer: BLUE CROSS/BLUE SHIELD | Source: Ambulatory Visit | Attending: Internal Medicine | Admitting: Internal Medicine

## 2022-05-05 ENCOUNTER — Other Ambulatory Visit
Admission: RE | Admit: 2022-05-05 | Discharge: 2022-05-05 | Disposition: A | Discharge status: Home / Self Care | Payer: BLUE CROSS/BLUE SHIELD | Source: Ambulatory Visit | Attending: Internal Medicine | Admitting: Internal Medicine

## 2022-05-05 ENCOUNTER — Other Ambulatory Visit: Payer: Self-pay | Admitting: Internal Medicine

## 2022-05-05 DIAGNOSIS — R600 Localized edema: Secondary | ICD-10-CM

## 2022-05-05 DIAGNOSIS — R7989 Other specified abnormal findings of blood chemistry: Secondary | ICD-10-CM

## 2022-05-05 LAB — D-DIMER, QUANTITATIVE: D-Dimer, Quant: 1.63 ug/mL-FEU — ABNORMAL HIGH (ref 0.00–0.50)

## 2022-06-02 ENCOUNTER — Other Ambulatory Visit: Payer: Self-pay | Admitting: Internal Medicine

## 2022-06-02 DIAGNOSIS — R590 Localized enlarged lymph nodes: Secondary | ICD-10-CM

## 2022-06-03 ENCOUNTER — Ambulatory Visit
Admission: RE | Admit: 2022-06-03 | Discharge: 2022-06-03 | Disposition: A | Discharge status: Home / Self Care | Payer: BC Managed Care – PPO | Source: Ambulatory Visit | Attending: Internal Medicine | Admitting: Internal Medicine

## 2022-06-03 DIAGNOSIS — R590 Localized enlarged lymph nodes: Secondary | ICD-10-CM | POA: Insufficient documentation

## 2022-06-25 ENCOUNTER — Ambulatory Visit
Admission: RE | Admit: 2022-06-25 | Discharge: 2022-06-25 | Disposition: A | Discharge status: Home / Self Care | Payer: BC Managed Care – PPO | Attending: Vascular Surgery | Admitting: Vascular Surgery

## 2022-06-25 ENCOUNTER — Encounter
Admission: RE | Disposition: A | Discharge status: Home / Self Care | Payer: Self-pay | Source: Home / Self Care | Attending: Vascular Surgery

## 2022-06-25 ENCOUNTER — Telehealth (INDEPENDENT_AMBULATORY_CARE_PROVIDER_SITE_OTHER): Payer: Self-pay

## 2022-06-25 DIAGNOSIS — N186 End stage renal disease: Secondary | ICD-10-CM | POA: Diagnosis not present

## 2022-06-25 DIAGNOSIS — Z87891 Personal history of nicotine dependence: Secondary | ICD-10-CM | POA: Insufficient documentation

## 2022-06-25 DIAGNOSIS — R0602 Shortness of breath: Secondary | ICD-10-CM | POA: Diagnosis not present

## 2022-06-25 DIAGNOSIS — E1122 Type 2 diabetes mellitus with diabetic chronic kidney disease: Secondary | ICD-10-CM | POA: Insufficient documentation

## 2022-06-25 DIAGNOSIS — I120 Hypertensive chronic kidney disease with stage 5 chronic kidney disease or end stage renal disease: Secondary | ICD-10-CM | POA: Insufficient documentation

## 2022-06-25 DIAGNOSIS — N189 Chronic kidney disease, unspecified: Secondary | ICD-10-CM

## 2022-06-25 HISTORY — PX: DIALYSIS/PERMA CATHETER INSERTION: CATH118288

## 2022-06-25 LAB — POTASSIUM (ARMC VASCULAR LAB ONLY): Potassium (ARMC vascular lab): 4.3 mmol/L (ref 3.5–5.1)

## 2022-06-25 LAB — GLUCOSE, CAPILLARY: Glucose-Capillary: 105 mg/dL — ABNORMAL HIGH (ref 70–99)

## 2022-06-25 SURGERY — DIALYSIS/PERMA CATHETER INSERTION
Anesthesia: Moderate Sedation

## 2022-06-25 MED ORDER — HEPARIN SODIUM (PORCINE) 10000 UNIT/ML IJ SOLN
INTRAMUSCULAR | Status: AC
  Filled 2022-06-25: qty 1

## 2022-06-25 MED ORDER — CEFAZOLIN SODIUM-DEXTROSE 1-4 GM/50ML-% IV SOLN: 1.0000 g | INTRAVENOUS | Status: AC

## 2022-06-25 MED ORDER — MIDAZOLAM HCL 2 MG/ML PO SYRP: 8.0000 mg | ORAL_SOLUTION | Freq: Once | ORAL | Status: DC | PRN

## 2022-06-25 MED ORDER — FENTANYL CITRATE (PF) 100 MCG/2ML IJ SOLN
INTRAMUSCULAR | Status: AC
  Filled 2022-06-25: qty 2

## 2022-06-25 MED ORDER — FENTANYL CITRATE (PF) 100 MCG/2ML IJ SOLN
INTRAMUSCULAR | Status: DC | PRN
  Administered 2022-06-25 (×2): 25 ug via INTRAVENOUS

## 2022-06-25 MED ORDER — METHYLPREDNISOLONE SODIUM SUCC 125 MG IJ SOLR
125.0000 mg | Freq: Once | INTRAMUSCULAR | Status: DC | PRN
Start: 2022-06-25 — End: 2022-06-25

## 2022-06-25 MED ORDER — DIPHENHYDRAMINE HCL 50 MG/ML IJ SOLN: 50.0000 mg | Freq: Once | INTRAMUSCULAR | Status: DC | PRN

## 2022-06-25 MED ORDER — CEFAZOLIN SODIUM-DEXTROSE 1-4 GM/50ML-% IV SOLN
INTRAVENOUS | Status: AC
  Administered 2022-06-25: 1 g via INTRAVENOUS
  Filled 2022-06-25: qty 50

## 2022-06-25 MED ORDER — MIDAZOLAM HCL 2 MG/2ML IJ SOLN
INTRAMUSCULAR | Status: DC | PRN
  Administered 2022-06-25 (×2): 1 mg via INTRAVENOUS

## 2022-06-25 MED ORDER — ONDANSETRON HCL 4 MG/2ML IJ SOLN
4.0000 mg | Freq: Four times a day (QID) | INTRAMUSCULAR | Status: DC | PRN
Start: 2022-06-25 — End: 2022-06-25

## 2022-06-25 MED ORDER — SODIUM CHLORIDE 0.9 % IV SOLN
INTRAVENOUS | Status: DC
Start: 2022-06-25 — End: 2022-06-25

## 2022-06-25 MED ORDER — HYDROMORPHONE HCL 1 MG/ML IJ SOLN: 1.0000 mg | Freq: Once | INTRAMUSCULAR | Status: DC | PRN

## 2022-06-25 MED ORDER — FAMOTIDINE 20 MG PO TABS: 40.0000 mg | ORAL_TABLET | Freq: Once | ORAL | Status: DC | PRN

## 2022-06-25 MED ORDER — MIDAZOLAM HCL 5 MG/5ML IJ SOLN
INTRAMUSCULAR | Status: AC
  Filled 2022-06-25: qty 5

## 2022-06-25 SURGICAL SUPPLY — 8 items
ADH SKN CLS APL DERMABOND .7 (GAUZE/BANDAGES/DRESSINGS) ×1
BIOPATCH RED 1 DISK 7.0 (GAUZE/BANDAGES/DRESSINGS) IMPLANT
CATH PALIN MAXID VT KIT 19CM (CATHETERS) IMPLANT
COVER PROBE ULTRASOUND 5X96 (MISCELLANEOUS) IMPLANT
DERMABOND ADVANCED .7 DNX12 (GAUZE/BANDAGES/DRESSINGS) IMPLANT
PACK ANGIOGRAPHY (CUSTOM PROCEDURE TRAY) IMPLANT
SUT MNCRL AB 4-0 PS2 18 (SUTURE) IMPLANT
SUT PROLENE 0 CT 1 30 (SUTURE) IMPLANT

## 2022-06-25 NOTE — H&P (Signed)
[facility] Admission History & Physical  MRN : [id number]  [name] Walker is a 47 y.o. (October 30, 1975) male who presents with chief complaint of No chief complaint on file. Marland Kitchen  History of Present Illness: Patient is sent over from his nephrologist for an urgent PermCath placement to initiate dialysis at this time.  He has a history of chronic kidney disease with acute worsening recently.  No fevers or chills.  No chest pain.  Shortness of breath with exertion is present.  No current facility-administered medications for this encounter.    Past Medical History:  Diagnosis Date   Diabetes mellitus without complication (HCC)    hgb A!C 6.0   Hypertension    Pulmonary embolism (HCC)    Sleep apnea     Past Surgical History:  Procedure Laterality Date   TONSILLECTOMY  [id number]   UMBILICAL HERNIA REPAIR       Social History   Tobacco Use   Smoking status: Former    Types: Cigarettes    Quit date: 2020    Years since quitting: 4.1   Smokeless tobacco: Never  Vaping Use   Vaping Use: Never used  Substance Use Topics   Alcohol use: Not Currently   Drug use: Never     Family History  Problem Relation Age of Onset   Diabetes Mother    Cancer Mother        lung  No bleeding or clotting disorders  Allergies  Allergen Reactions   Ace Inhibitors Cough   Amlodipine     Pedal edema   Daptomycin Other (See Comments)    Makes CK levels go very high   Iodinated Contrast Media     afftected his kidneys    Metformin And Related     diarrhea   Other     Seasonal allergies      REVIEW OF SYSTEMS (Negative unless checked)  Constitutional: '[]'$ Weight loss  '[]'$ Fever  '[]'$ Chills Cardiac: '[]'$ Chest pain   '[]'$ Chest pressure   '[]'$ Palpitations   '[]'$ Shortness of breath when laying flat   '[]'$ Shortness of breath at rest   '[x]'$ Shortness of breath with exertion. Vascular:  '[]'$ Pain in legs with walking   '[]'$ Pain in legs at rest   '[]'$ Pain in legs when laying flat   '[]'$ Claudication    '[]'$ Pain in feet when walking  '[]'$ Pain in feet at rest  '[]'$ Pain in feet when laying flat   '[]'$ History of DVT   '[]'$ Phlebitis   '[]'$ Swelling in legs   '[]'$ Varicose veins   '[]'$ Non-healing ulcers Pulmonary:   '[]'$ Uses home oxygen   '[]'$ Productive cough   '[]'$ Hemoptysis   '[]'$ Wheeze  '[]'$ COPD   '[]'$ Asthma Neurologic:  '[]'$ Dizziness  '[]'$ Blackouts   '[]'$ Seizures   '[]'$ History of stroke   '[]'$ History of TIA  '[]'$ Aphasia   '[]'$ Temporary blindness   '[]'$ Dysphagia   '[]'$ Weakness or numbness in arms   '[]'$ Weakness or numbness in legs Musculoskeletal:  '[x]'$ Arthritis   '[]'$ Joint swelling   '[x]'$ Joint pain   '[]'$ Low back pain Hematologic:  '[]'$ Easy bruising  '[]'$ Easy bleeding   '[]'$ Hypercoagulable state   '[x]'$ Anemic  '[]'$ Hepatitis Gastrointestinal:  '[]'$ Blood in stool   '[]'$ Vomiting blood  '[]'$ Gastroesophageal reflux/heartburn   '[]'$ Difficulty swallowing. Genitourinary:  '[x]'$ Chronic kidney disease   '[]'$ Difficult urination  '[]'$ Frequent urination  '[]'$ Burning with urination   '[]'$ Blood in urine Skin:  '[]'$ Rashes   '[]'$ Ulcers   '[]'$ Wounds Psychological:  '[]'$ History of anxiety   '[]'$  History of major depression.  Physical Examination  There were no vitals filed for this visit.  There is no height or weight on file to calculate BMI. Gen: WD/WN, NAD Head: [place]/AT, No temporalis wasting.  Ear/Nose/Throat: Hearing grossly intact, nares w/o erythema or drainage, oropharynx w/o Erythema/Exudate,  Eyes: Conjunctiva clear, sclera non-icteric Neck: Trachea midline.  No JVD.  Pulmonary:  Good air movement, respirations not labored, no use of accessory muscles.  Cardiac: RRR, normal S1, S2. Vascular:  Vessel Right Left  Radial Palpable Palpable           Musculoskeletal: M/S 5/5 throughout.  Extremities without ischemic changes.  No deformity or atrophy.  Neurologic: Sensation grossly intact in extremities.  Symmetrical.  Speech is fluent. Motor exam as listed above. Psychiatric: Judgment intact, Mood & affect appropriate for pt's clinical situation. Dermatologic: No rashes or ulcers noted.  No  cellulitis or open wounds. Lymph : No Cervical, Axillary, or Inguinal lymphadenopathy.     CBC Lab Results  Component Value Date   WBC 4.7 11/07/2020   HGB 16.3 11/07/2020   HCT 49.3 11/07/2020   MCV 91 11/07/2020   PLT 177 11/07/2020    BMET    Component Value Date/Time   NA 143 11/07/2020 0937   NA 139 03/31/2013 1318   K 4.2 11/07/2020 0937   K 3.3 (L) 03/31/2013 1318   CL 99 11/07/2020 0937   CL 99 03/31/2013 1318   CO2 28 11/07/2020 0937   CO2 32 03/31/2013 1318   GLUCOSE 129 (H) 11/07/2020 0937   GLUCOSE 89 02/21/2019 1456   GLUCOSE 141 (H) 03/31/2013 1318   BUN 13 11/07/2020 0937   BUN 11 03/31/2013 1318   CREATININE 2.15 (H) 11/07/2020 0937   CREATININE 0.83 03/31/2013 1318   CALCIUM 8.9 11/07/2020 0937   CALCIUM 10.1 03/31/2013 1318   GFRNONAA 54 (L) 02/24/2020 1550   GFRNONAA >60 03/31/2013 1318   GFRAA 62 02/24/2020 1550   GFRAA >60 03/31/2013 1318   CrCl cannot be calculated (Patient's most recent lab result is older than the maximum 21 days allowed.).  COAG Lab Results  Component Value Date   INR 1.0 05/08/2011    Radiology [place] RT LOWER EXTREM LTD SOFT TISSUE NON VASCULAR  Result Date: 06/05/2022 CLINICAL DATA:  Inguinal adenopathy. EXAM: ULTRASOUND right LOWER EXTREMITY LIMITED TECHNIQUE: Ultrasound examination of the lower extremity soft tissues was performed in the area of clinical concern. COMPARISON:  May 05, 2022. FINDINGS: Three lymph nodes are noted in the right inguinal region. All 3 appear to have slightly decreased in size compared to prior exam, with the largest measuring 9 mm in minor axis. They all demonstrate fatty hila, and most likely are inflammatory or reactive in etiology. IMPRESSION: Right inguinal adenopathy noted on prior exam appears to be slightly decreased currently, with all 3 now less than 1 cm in minor axis and therefore most likely representing inflammatory or reactive etiology. Electronically Signed   By: Marijo Conception  M.D.   On: 06/05/2022 12:31     Assessment/Plan 1.  End-stage renal disease.  Sent over from his nephrologist for an urgent permacath.  Risks and benefits discussed. 2.  Diabetes.  Likely an underlying cause of his renal failure and blood glucose control important in reducing the progression of atherosclerotic disease. Also, involved in wound healing. On appropriate medications. 3.  Hypertension.  Likely an underlying cause of his renal failure and blood pressure control important in reducing the progression of atherosclerotic disease. On appropriate oral medications.    Leotis Pain, MD  06/25/2022 2:06 PM

## 2022-06-25 NOTE — Telephone Encounter (Signed)
Spoke with the patient and he is scheduled with Dr. Lucky Cowboy for a urgent permcath insertion today with a 12:30 pm arrival time to [facility]. Patient was advised to not eat or drink anything and he stated he had not eaten or drank this morning.

## 2022-06-25 NOTE — Op Note (Signed)
OPERATIVE NOTE    PRE-OPERATIVE DIAGNOSIS: 1. ESRD   POST-OPERATIVE DIAGNOSIS: same as above  PROCEDURE: Ultrasound guidance for vascular access to the right internal jugular vein Fluoroscopic guidance for placement of catheter Placement of a 19 cm tip to cuff tunneled hemodialysis catheter via the right internal jugular vein  SURGEON: Leotis Pain, MD  ANESTHESIA:  Local with Moderate conscious sedation for approximately 22 minutes using 2 mg of Versed and 50 mcg of Fentanyl  ESTIMATED BLOOD LOSS: 10 cc  FLUORO TIME: less than one minute  CONTRAST: none  FINDING(S): 1.  Patent right internal jugular vein  SPECIMEN(S):  None  INDICATIONS:   [name] Walker is a 47 y.o.male who presents with renal failure.  The patient needs long term dialysis access for their ESRD, and a Permcath is necessary.  Risks and benefits are discussed and informed consent is obtained.    DESCRIPTION: After obtaining full informed written consent, the patient was brought back to the vascular suited. The patient's right neck and chest were sterilely prepped and draped in a sterile surgical field was created. Moderate conscious sedation was administered during a face to face encounter with the patient throughout the procedure with my supervision of the RN administering medicines and monitoring the patient's vital signs, pulse oximetry, telemetry and mental status throughout from the start of the procedure until the patient was taken to the recovery room.  The right internal jugular vein was visualized with ultrasound and found to be patent. It was then accessed under direct ultrasound guidance and a permanent image was recorded. A wire was placed. After skin nick and dilatation, the peel-away sheath was placed over the wire. I then turned my attention to an area under the clavicle. Approximately 1-2 fingerbreadths below the clavicle a small counterincision was created and tunneled from the subclavicular incision to  the access site. Using fluoroscopic guidance, a 19 centimeter tip to cuff tunneled hemodialysis catheter was selected, and tunneled from the subclavicular incision to the access site. It was then placed through the peel-away sheath and the peel-away sheath was removed. Using fluoroscopic guidance the catheter tips were parked in the right atrium. The appropriate distal connectors were placed. It withdrew blood well and flushed easily with heparinized saline and a concentrated heparin solution was then placed. It was secured to the chest wall with 2 Prolene sutures. The access incision was closed single 4-0 Monocryl. A 4-0 Monocryl pursestring suture was placed around the exit site. Sterile dressings were placed. The patient tolerated the procedure well and was taken to the recovery room in stable condition.  COMPLICATIONS: None  CONDITION: Stable  Leotis Pain, MD 06/25/2022 3:27 PM   This note was created with Dragon Medical transcription system. Any errors in dictation are purely unintentional.

## 2022-06-26 ENCOUNTER — Encounter: Payer: Self-pay | Admitting: Vascular Surgery

## 2022-08-06 ENCOUNTER — Other Ambulatory Visit (INDEPENDENT_AMBULATORY_CARE_PROVIDER_SITE_OTHER): Payer: Self-pay | Admitting: Vascular Surgery

## 2022-08-06 DIAGNOSIS — N186 End stage renal disease: Secondary | ICD-10-CM

## 2022-08-17 DIAGNOSIS — Z992 Dependence on renal dialysis: Secondary | ICD-10-CM | POA: Insufficient documentation

## 2022-08-17 DIAGNOSIS — N186 End stage renal disease: Secondary | ICD-10-CM | POA: Insufficient documentation

## 2022-08-17 NOTE — Progress Notes (Deleted)
MRN : [id number]  [name] Walker is a 47 y.o. (May 27, 1975) male who presents with chief complaint of check access.  History of Present Illness:    The patient is seen for evaluation of dialysis access.    Current access is via a catheter which is functioning adequately.  He is status postplacement of a right IJ tunneled catheter June 25, 2022.  There have not been episodes of catheter infection.  The patient denies fever and chills while on dialysis.  No tenderness or drainage at the exit site.  No recent shortening of the patient's walking distance or new symptoms consistent with claudication.  No history of rest pain symptoms. No new ulcers or wounds of the lower extremities have occurred.  The patient denies amaurosis fugax or recent TIA symptoms. There are no recent neurological changes noted. There is no history of DVT, PE or superficial thrombophlebitis. No recent episodes of angina or shortness of breath documented.   No outpatient medications have been marked as taking for the 08/18/22 encounter (Appointment) with Gilda Crease, Latina Craver, MD.    Past Medical History:  Diagnosis Date   Diabetes mellitus without complication (HCC)    hgb A!C 6.0   Hypertension    Pulmonary embolism (HCC)    Sleep apnea     Past Surgical History:  Procedure Laterality Date   DIALYSIS/PERMA CATHETER INSERTION N/A 06/25/2022   Procedure: DIALYSIS/PERMA CATHETER INSERTION;  Surgeon: Annice Needy, MD;  Location: [facility] INVASIVE CV LAB;  Service: Cardiovascular;  Laterality: N/A;   TONSILLECTOMY  1985   UMBILICAL HERNIA REPAIR      Social History Social History   Tobacco Use   Smoking status: Former    Types: Cigarettes    Quit date: 2020    Years since quitting: 4.3   Smokeless tobacco: Never  Vaping Use   Vaping Use: Never used  Substance Use Topics   Alcohol use: Not Currently   Drug use: Never    Family History Family History  Problem Relation Age of  Onset   Diabetes Mother    Cancer Mother        lung    Allergies  Allergen Reactions   Ace Inhibitors Cough   Amlodipine     Pedal edema   Daptomycin Other (See Comments)    Makes CK levels go very high   Iodinated Contrast Media     afftected his kidneys    Metformin And Related     diarrhea   Other     Seasonal allergies      REVIEW OF SYSTEMS (Negative unless checked)  Constitutional: Weight loss  Fever  Chills Cardiac: Chest pain   Chest pressure   Palpitations   Shortness of breath when laying flat   Shortness of breath with exertion. Vascular:  Pain in legs with walking   Pain in legs at rest  History of DVT   Phlebitis   Swelling in legs   Varicose veins   Non-healing ulcers Pulmonary:   Uses home oxygen   Productive cough   Hemoptysis   Wheeze  COPD   Asthma Neurologic:  Dizziness   Seizures   History of stroke   History of TIA  Aphasia   Vissual changes   Weakness or numbness in arm   Weakness or numbness in leg Musculoskeletal:   Joint swelling     Joint pain   Low back pain Hematologic:  Easy bruising  Easy bleeding   Hypercoagulable state   Anemic Gastrointestinal:  Diarrhea   Vomiting  Gastroesophageal reflux/heartburn   Difficulty swallowing. Genitourinary:  Chronic kidney disease   Difficult urination  Frequent urination   Blood in urine Skin:  Rashes   Ulcers  Psychological:  History of anxiety    History of major depression.  Physical Examination  There were no vitals filed for this visit. There is no height or weight on file to calculate BMI. Gen: WD/WN, NAD Head: [place]/AT, No temporalis wasting.  Ear/Nose/Throat: Hearing grossly intact, nares w/o erythema or drainage Eyes: PER, EOMI, sclera nonicteric.  Neck: Supple, no gross masses or lesions.  No JVD.  Pulmonary:  Good air movement, no audible wheezing, no use of accessory muscles.  Cardiac: RRR, precordium  non-hyperdynamic. Vascular:   *** Vessel Right Left  Radial Palpable Palpable  Brachial Palpable Palpable  Gastrointestinal: soft, non-distended. No guarding/no peritoneal signs.  Musculoskeletal: M/S 5/5 throughout.  No deformity.  Neurologic: CN 2-12 intact. Pain and light touch intact in extremities.  Symmetrical.  Speech is fluent. Motor exam as listed above. Psychiatric: Judgment intact, Mood & affect appropriate for pt's clinical situation. Dermatologic: No rashes or ulcers noted.  No changes consistent with cellulitis.   CBC Lab Results  Component Value Date   WBC 4.7 11/07/2020   HGB 16.3 11/07/2020   HCT 49.3 11/07/2020   MCV 91 11/07/2020   PLT 177 11/07/2020    BMET    Component Value Date/Time   NA 143 11/07/2020 0937   NA 139 03/31/2013 1318   K 4.2 11/07/2020 0937   K 3.3 (L) 03/31/2013 1318   CL 99 11/07/2020 0937   CL 99 03/31/2013 1318   CO2 28 11/07/2020 0937   CO2 32 03/31/2013 1318   GLUCOSE 129 (H) 11/07/2020 0937   GLUCOSE 89 02/21/2019 1456   GLUCOSE 141 (H) 03/31/2013 1318   BUN 13 11/07/2020 0937   BUN 11 03/31/2013 1318   CREATININE 2.15 (H) 11/07/2020 0937   CREATININE 0.83 03/31/2013 1318   CALCIUM 8.9 11/07/2020 0937   CALCIUM 10.1 03/31/2013 1318   GFRNONAA 54 (L) 02/24/2020 1550   GFRNONAA >60 03/31/2013 1318   GFRAA 62 02/24/2020 1550   GFRAA >60 03/31/2013 1318   CrCl cannot be calculated (Patient's most recent lab result is older than the maximum 21 days allowed.).  COAG Lab Results  Component Value Date   INR 1.0 05/08/2011    Radiology No results found.   Assessment/Plan There are no diagnoses linked to this encounter.   Levora Dredge, MD  08/17/2022 11:16 AM

## 2022-08-18 ENCOUNTER — Encounter (INDEPENDENT_AMBULATORY_CARE_PROVIDER_SITE_OTHER): Payer: BC Managed Care – PPO | Admitting: Vascular Surgery

## 2022-08-18 ENCOUNTER — Encounter (INDEPENDENT_AMBULATORY_CARE_PROVIDER_SITE_OTHER): Payer: BC Managed Care – PPO

## 2022-08-18 ENCOUNTER — Other Ambulatory Visit (INDEPENDENT_AMBULATORY_CARE_PROVIDER_SITE_OTHER): Payer: BC Managed Care – PPO

## 2022-08-18 DIAGNOSIS — N186 End stage renal disease: Secondary | ICD-10-CM

## 2022-08-18 DIAGNOSIS — I10 Essential (primary) hypertension: Secondary | ICD-10-CM

## 2022-08-18 DIAGNOSIS — M5416 Radiculopathy, lumbar region: Secondary | ICD-10-CM

## 2022-08-18 DIAGNOSIS — E1121 Type 2 diabetes mellitus with diabetic nephropathy: Secondary | ICD-10-CM

## 2022-08-18 DIAGNOSIS — E782 Mixed hyperlipidemia: Secondary | ICD-10-CM

## 2022-09-18 ENCOUNTER — Ambulatory Visit (INDEPENDENT_AMBULATORY_CARE_PROVIDER_SITE_OTHER): Payer: BC Managed Care – PPO | Admitting: Nurse Practitioner

## 2022-09-18 ENCOUNTER — Encounter (INDEPENDENT_AMBULATORY_CARE_PROVIDER_SITE_OTHER): Payer: Self-pay | Admitting: Nurse Practitioner

## 2022-09-18 ENCOUNTER — Ambulatory Visit (INDEPENDENT_AMBULATORY_CARE_PROVIDER_SITE_OTHER): Payer: BC Managed Care – PPO

## 2022-09-18 VITALS — BP 133/97 | HR 98 | Resp 16 | Ht 69.0 in | Wt 262.0 lb

## 2022-09-18 DIAGNOSIS — E1121 Type 2 diabetes mellitus with diabetic nephropathy: Secondary | ICD-10-CM

## 2022-09-18 DIAGNOSIS — I10 Essential (primary) hypertension: Secondary | ICD-10-CM | POA: Diagnosis not present

## 2022-09-18 DIAGNOSIS — N186 End stage renal disease: Secondary | ICD-10-CM

## 2022-09-19 ENCOUNTER — Encounter (INDEPENDENT_AMBULATORY_CARE_PROVIDER_SITE_OTHER): Payer: Self-pay | Admitting: Nurse Practitioner

## 2022-09-19 NOTE — H&P (View-Only) (Signed)
 Subjective:    Patient ID: [name] Walker, male    DOB: 03/04/1976, 47 y.o.   MRN: [id number] Chief Complaint  Patient presents with   New Patient (Initial Visit)    Ref Singh consult perm cath access and vein mapp    The patient is seen for evaluation for dialysis access.  The patient is currently on dialysis.  He is maintained via PermCath.  He denies that there currently any issues with his PermCath.  He has been on dialysis since February.  The patient is followed by nephrology.    The patient is right-handed.  The patient has been considering the various methods of dialysis and wishes to proceed with hemodialysis and therefore creation of AV access.  No recent shortening of the patient's walking distance or new symptoms consistent with claudication.  No history of rest pain symptoms. No new ulcers or wounds of the lower extremities have occurred.  The patient denies amaurosis fugax or recent TIA symptoms. There are no recent neurological changes noted. There is no history of DVT, PE or superficial thrombophlebitis. No recent episodes of angina or shortness of breath documented.   Noninvasive studies show adequate vein diameters for creation of a left radiocephalic AV fistula    Review of Systems  All other systems reviewed and are negative.      Objective:   Physical Exam Vitals reviewed.  HENT:     Head: Normocephalic.  Cardiovascular:     Rate and Rhythm: Normal rate.     Pulses:          Radial pulses are 2+ on the right side and 2+ on the left side.  Pulmonary:     Effort: Pulmonary effort is normal.  Skin:    General: Skin is warm and dry.  Neurological:     Mental Status: He is alert and oriented to person, place, and time.  Psychiatric:        Mood and Affect: Mood normal.        Behavior: Behavior normal.        Thought Content: Thought content normal.        Judgment: Judgment normal.     BP (!) 133/97 (BP Location: Left Arm)   Pulse 98   Resp 16    Ht 5' 9" (1.753 m)   Wt 262 lb (118.8 kg)   BMI 38.69 kg/m   Past Medical History:  Diagnosis Date   Diabetes mellitus without complication (HCC)    hgb A!C 6.0   Hypertension    Pulmonary embolism (HCC)    Sleep apnea     Social History   Socioeconomic History   Marital status: Married    Spouse name: Not on file   Number of children: Not on file   Years of education: Not on file   Highest education level: Not on file  Occupational History   Occupation: Barber  Tobacco Use   Smoking status: Former    Types: Cigarettes    Quit date: 2020    Years since quitting: 4.3   Smokeless tobacco: Never  Vaping Use   Vaping Use: Never used  Substance and Sexual Activity   Alcohol use: Not Currently   Drug use: Never   Sexual activity: Yes    Birth control/protection: None  Other Topics Concern   Not on file  Social History Narrative   Not on file   Social Determinants of Health   Financial Resource Strain: Not on file    Food Insecurity: Not on file  Transportation Needs: Not on file  Physical Activity: Insufficiently Active (09/14/2018)   Exercise Vital Sign    Days of Exercise per Week: 1 day    Minutes of Exercise per Session: 10 min  Stress: No Stress Concern Present (09/14/2018)   [employer] - Occupational Stress Questionnaire    Feeling of Stress : Only a little  Social Connections: Moderately Integrated (09/14/2018)   Social Connection and Isolation Panel [NHANES]    Frequency of Communication with Friends and Family: Twice a week    Frequency of Social Gatherings with Friends and Family: Twice a week    Attends Religious Services: 1 to 4 times per year    Active Member of Clubs or Organizations: No    Attends Club or Organization Meetings: Never    Marital Status: Married  Intimate Partner Violence: Not At Risk (09/14/2018)   Humiliation, Afraid, Rape, and Kick questionnaire    Fear of Current or Ex-Partner: No    Emotionally  Abused: No    Physically Abused: No    Sexually Abused: No    Past Surgical History:  Procedure Laterality Date   DIALYSIS/PERMA CATHETER INSERTION N/A 06/25/2022   Procedure: DIALYSIS/PERMA CATHETER INSERTION;  Surgeon: Dew, Jason S, MD;  Location: [facility];  Service: Cardiovascular;  Laterality: N/A;   TONSILLECTOMY  1985   UMBILICAL HERNIA REPAIR      Family History  Problem Relation Age of Onset   Diabetes Mother    Cancer Mother        lung    Allergies  Allergen Reactions   Ace Inhibitors Cough   Amlodipine     Pedal edema   Daptomycin Other (See Comments)    Makes CK levels go very high   Iodinated Contrast Media     afftected his kidneys    Metformin And Related     diarrhea   Other     Seasonal allergies        Latest Ref Rng & Units 11/07/2020    9:37 AM 12/15/2018   10:48 AM 12/02/2018   10:11 AM  CBC  WBC 3.4 - 10.8 x10E3/uL 4.7  4.9  4.8   Hemoglobin 13.0 - 17.7 g/dL 16.3  11.6  11.3   Hematocrit 37.5 - 51.0 % 49.3  34.8  34.6   Platelets 150 - 450 x10E3/uL 177  133  CANCELED       CMP     Component Value Date/Time   NA 143 11/07/2020 0937   NA 139 03/31/2013 1318   K 4.2 11/07/2020 0937   K 3.3 (L) 03/31/2013 1318   CL 99 11/07/2020 0937   CL 99 03/31/2013 1318   CO2 28 11/07/2020 0937   CO2 32 03/31/2013 1318   GLUCOSE 129 (H) 11/07/2020 0937   GLUCOSE 89 02/21/2019 1456   GLUCOSE 141 (H) 03/31/2013 1318   BUN 13 11/07/2020 0937   BUN 11 03/31/2013 1318   CREATININE 2.15 (H) 11/07/2020 0937   CREATININE 0.83 03/31/2013 1318   CALCIUM 8.9 11/07/2020 0937   CALCIUM 10.1 03/31/2013 1318   PROT 6.1 11/07/2020 0937   PROT 8.1 03/01/2012 0205   ALBUMIN 3.0 (L) 11/07/2020 0937   ALBUMIN 3.5 03/01/2012 0205   AST 23 11/07/2020 0937   AST 163 (H) 03/01/2012 0205   ALT 17 11/07/2020 0937   ALT 181 (H) 03/01/2012 0205   ALKPHOS 100 11/07/2020 0937   ALKPHOS 74 03/01/2012   0205   BILITOT 0.3 11/07/2020 0937   BILITOT 0.3  03/01/2012 0205   GFRNONAA 54 (L) 02/24/2020 1550   GFRNONAA >60 03/31/2013 1318   GFRAA 62 02/24/2020 1550   GFRAA >60 03/31/2013 1318     No results found.     Assessment & Plan:   1. End stage renal disease (HCC) Recommend:  At this time the patient does not have appropriate extremity access for dialysis  Patient should have a left radiocephalic AV fistula created.  The risks, benefits and alternative therapies were reviewed in detail with the patient.  All questions were answered.  The patient agrees to proceed with surgery.   The patient will follow up with me in the office after the surgery.  2. Diabetes mellitus with nephropathy (HCC) Continue hypoglycemic medications as already ordered, these medications have been reviewed and there are no changes at this time.  Hgb A1C to be monitored as already arranged by primary service  3. Essential (primary) hypertension Continue antihypertensive medications as already ordered, these medications have been reviewed and there are no changes at this time.   Current Outpatient Medications on File Prior to Visit  Medication Sig Dispense Refill   acetaminophen (TYLENOL) 500 MG tablet Take 500 mg by mouth every 6 (six) hours as needed.     atorvastatin (LIPITOR) 80 MG tablet Take 1 tablet (80 mg total) by mouth daily. 90 tablet 1   carvedilol (COREG) 25 MG tablet Take 25 mg by mouth 2 (two) times daily with a meal.     losartan (COZAAR) 50 MG tablet Take 1 tablet (50 mg total) by mouth daily. 90 tablet 1   NON FORMULARY CPAP everynight     torsemide (DEMADEX) 20 MG tablet Take 20 mg by mouth. Tues, Thursday, Saturday,Sunday     triamcinolone cream (KENALOG) 0.1 % APPLY TO AFFECTED AREA TWICE A DAY 30 g 0   venlafaxine (EFFEXOR) 37.5 MG tablet TAKE 1 TABLET BY MOUTH 2 TIMES DAILY. 180 tablet 1   gabapentin (NEURONTIN) 600 MG tablet TAKE 1 TABLET BY MOUTH THREE TIMES A DAY (Patient not taking: Reported on 09/18/2022) 270 tablet 2    hydrochlorothiazide (HYDRODIURIL) 25 MG tablet Take 1 tablet (25 mg total) by mouth daily. (Patient not taking: Reported on 09/18/2022) 90 tablet 1   HYDROcodone-acetaminophen (NORCO/VICODIN) 5-325 MG tablet Take 1/2 tablet every 12 hours for pain. (Patient not taking: Reported on 11/05/2020) 5 tablet 0   metoprolol succinate (TOPROL-XL) 50 MG 24 hr tablet TAKE ONE TABLET DAILY. TAKE WITH OR IMMEDIATELY FOLLOWING A MEAL. (Patient not taking: Reported on 09/18/2022) 30 tablet 0   TRULICITY 1.5 MG/0.5ML SOPN INJECT 1.5 MG INTO THE SKIN ONCE A WEEK. (Patient not taking: Reported on 09/18/2022) 15 mL 0   vardenafil (LEVITRA) 10 MG tablet Take 1 tablet (10 mg total) by mouth daily as needed for erectile dysfunction. (Patient not taking: Reported on 11/05/2020) 10 tablet 0   [DISCONTINUED] sildenafil (VIAGRA) 50 MG tablet Take 1 tablet (50 mg total) by mouth daily as needed for erectile dysfunction. 10 tablet 0   [DISCONTINUED] tadalafil (CIALIS) 10 MG tablet Take 1 tablet (10 mg total) by mouth daily as needed for erectile dysfunction. 10 tablet 0   No current facility-administered medications on file prior to visit.    There are no Patient Instructions on file for this visit. No follow-ups on file.   Jasime Westergren E Chatwin, NP   

## 2022-09-19 NOTE — Progress Notes (Signed)
Subjective:    Patient ID: [name] Walker, male    DOB: 1976-02-06, 47 y.o.   MRN: [id number] Chief Complaint  Patient presents with   New Patient (Initial Visit)    Ref Thedore Mins consult perm cath access and vein mapp    The patient is seen for evaluation for dialysis access.  The patient is currently on dialysis.  He is maintained via PermCath.  He denies that there currently any issues with his PermCath.  He has been on dialysis since February.  The patient is followed by nephrology.    The patient is right-handed.  The patient has been considering the various methods of dialysis and wishes to proceed with hemodialysis and therefore creation of AV access.  No recent shortening of the patient's walking distance or new symptoms consistent with claudication.  No history of rest pain symptoms. No new ulcers or wounds of the lower extremities have occurred.  The patient denies amaurosis fugax or recent TIA symptoms. There are no recent neurological changes noted. There is no history of DVT, PE or superficial thrombophlebitis. No recent episodes of angina or shortness of breath documented.   Noninvasive studies show adequate vein diameters for creation of a left radiocephalic AV fistula    Review of Systems  All other systems reviewed and are negative.      Objective:   Physical Exam Vitals reviewed.  HENT:     Head: Normocephalic.  Cardiovascular:     Rate and Rhythm: Normal rate.     Pulses:          Radial pulses are 2+ on the right side and 2+ on the left side.  Pulmonary:     Effort: Pulmonary effort is normal.  Skin:    General: Skin is warm and dry.  Neurological:     Mental Status: He is alert and oriented to person, place, and time.  Psychiatric:        Mood and Affect: Mood normal.        Behavior: Behavior normal.        Thought Content: Thought content normal.        Judgment: Judgment normal.     BP (!) 133/97 (BP Location: Left Arm)   Pulse 98   Resp 16    Ht 5\' 9"  (1.753 m)   Wt 262 lb (118.8 kg)   BMI 38.69 kg/m   Past Medical History:  Diagnosis Date   Diabetes mellitus without complication (HCC)    hgb A!C 6.0   Hypertension    Pulmonary embolism (HCC)    Sleep apnea     Social History   Socioeconomic History   Marital status: Married    Spouse name: Not on file   Number of children: Not on file   Years of education: Not on file   Highest education level: Not on file  Occupational History   Occupation: Paediatric nurse  Tobacco Use   Smoking status: Former    Types: Cigarettes    Quit date: 2020    Years since quitting: 4.3   Smokeless tobacco: Never  Vaping Use   Vaping Use: Never used  Substance and Sexual Activity   Alcohol use: Not Currently   Drug use: Never   Sexual activity: Yes    Birth control/protection: None  Other Topics Concern   Not on file  Social History Narrative   Not on file   Social Determinants of Health   Financial Resource Strain: Not on file  Food Insecurity: Not on file  Transportation Needs: Not on file  Physical Activity: Insufficiently Active (09/14/2018)   Exercise Vital Sign    Days of Exercise per Week: 1 day    Minutes of Exercise per Session: 10 min  Stress: No Stress Concern Present (09/14/2018)   [employer] of Occupational Health - Occupational Stress Questionnaire    Feeling of Stress : Only a little  Social Connections: Moderately Integrated (09/14/2018)   Social Connection and Isolation Panel [NHANES]    Frequency of Communication with Friends and Family: Twice a week    Frequency of Social Gatherings with Friends and Family: Twice a week    Attends Religious Services: 1 to 4 times per year    Active Member of [employer] or Organizations: No    Attends Banker Meetings: Never    Marital Status: Married  Catering manager Violence: Not At Risk (09/14/2018)   Humiliation, Afraid, Rape, and Kick questionnaire    Fear of Current or Ex-Partner: No    Emotionally  Abused: No    Physically Abused: No    Sexually Abused: No    Past Surgical History:  Procedure Laterality Date   DIALYSIS/PERMA CATHETER INSERTION N/A 06/25/2022   Procedure: DIALYSIS/PERMA CATHETER INSERTION;  Surgeon: Annice Needy, MD;  Location: [facility];  Service: Cardiovascular;  Laterality: N/A;   TONSILLECTOMY  1985   UMBILICAL HERNIA REPAIR      Family History  Problem Relation Age of Onset   Diabetes Mother    Cancer Mother        lung    Allergies  Allergen Reactions   Ace Inhibitors Cough   Amlodipine     Pedal edema   Daptomycin Other (See Comments)    Makes CK levels go very high   Iodinated Contrast Media     afftected his kidneys    Metformin And Related     diarrhea   Other     Seasonal allergies        Latest Ref Rng & Units 11/07/2020    9:37 AM 12/15/2018   10:48 AM 12/02/2018   10:11 AM  CBC  WBC 3.4 - 10.8 x10E3/uL 4.7  4.9  4.8   Hemoglobin 13.0 - 17.7 g/dL 16.1  09.6  04.5   Hematocrit 37.5 - 51.0 % 49.3  34.8  34.6   Platelets 150 - 450 x10E3/uL 177  133  CANCELED       CMP     Component Value Date/Time   NA 143 11/07/2020 0937   NA 139 03/31/2013 1318   K 4.2 11/07/2020 0937   K 3.3 (L) 03/31/2013 1318   CL 99 11/07/2020 0937   CL 99 03/31/2013 1318   CO2 28 11/07/2020 0937   CO2 32 03/31/2013 1318   GLUCOSE 129 (H) 11/07/2020 0937   GLUCOSE 89 02/21/2019 1456   GLUCOSE 141 (H) 03/31/2013 1318   BUN 13 11/07/2020 0937   BUN 11 03/31/2013 1318   CREATININE 2.15 (H) 11/07/2020 0937   CREATININE 0.83 03/31/2013 1318   CALCIUM 8.9 11/07/2020 0937   CALCIUM 10.1 03/31/2013 1318   PROT 6.1 11/07/2020 0937   PROT 8.1 03/01/2012 0205   ALBUMIN 3.0 (L) 11/07/2020 0937   ALBUMIN 3.5 03/01/2012 0205   AST 23 11/07/2020 0937   AST 163 (H) 03/01/2012 0205   ALT 17 11/07/2020 0937   ALT 181 (H) 03/01/2012 0205   ALKPHOS 100 11/07/2020 0937   ALKPHOS 74 03/01/2012  0205   BILITOT 0.3 11/07/2020 0937   BILITOT 0.3  03/01/2012 0205   GFRNONAA 54 (L) 02/24/2020 1550   GFRNONAA >60 03/31/2013 1318   GFRAA 62 02/24/2020 1550   GFRAA >60 03/31/2013 1318     No results found.     Assessment & Plan:   1. End stage renal disease (HCC) Recommend:  At this time the patient does not have appropriate extremity access for dialysis  Patient should have a left radiocephalic AV fistula created.  The risks, benefits and alternative therapies were reviewed in detail with the patient.  All questions were answered.  The patient agrees to proceed with surgery.   The patient will follow up with me in the office after the surgery.  2. Diabetes mellitus with nephropathy (HCC) Continue hypoglycemic medications as already ordered, these medications have been reviewed and there are no changes at this time.  Hgb A1C to be monitored as already arranged by primary service  3. Essential (primary) hypertension Continue antihypertensive medications as already ordered, these medications have been reviewed and there are no changes at this time.   Current Outpatient Medications on File Prior to Visit  Medication Sig Dispense Refill   acetaminophen (TYLENOL) 500 MG tablet Take 500 mg by mouth every 6 (six) hours as needed.     atorvastatin (LIPITOR) 80 MG tablet Take 1 tablet (80 mg total) by mouth daily. 90 tablet 1   carvedilol (COREG) 25 MG tablet Take 25 mg by mouth 2 (two) times daily with a meal.     losartan (COZAAR) 50 MG tablet Take 1 tablet (50 mg total) by mouth daily. 90 tablet 1   NON FORMULARY CPAP everynight     torsemide (DEMADEX) 20 MG tablet Take 20 mg by mouth. Tues, Thursday, Saturday,Sunday     triamcinolone cream (KENALOG) 0.1 % APPLY TO AFFECTED AREA TWICE A DAY 30 g 0   venlafaxine (EFFEXOR) 37.5 MG tablet TAKE 1 TABLET BY MOUTH 2 TIMES DAILY. 180 tablet 1   gabapentin (NEURONTIN) 600 MG tablet TAKE 1 TABLET BY MOUTH THREE TIMES A DAY (Patient not taking: Reported on 09/18/2022) 270 tablet 2    hydrochlorothiazide (HYDRODIURIL) 25 MG tablet Take 1 tablet (25 mg total) by mouth daily. (Patient not taking: Reported on 09/18/2022) 90 tablet 1   HYDROcodone-acetaminophen (NORCO/VICODIN) 5-325 MG tablet Take 1/2 tablet every 12 hours for pain. (Patient not taking: Reported on 11/05/2020) 5 tablet 0   metoprolol succinate (TOPROL-XL) 50 MG 24 hr tablet TAKE ONE TABLET DAILY. TAKE WITH OR IMMEDIATELY FOLLOWING A MEAL. (Patient not taking: Reported on 09/18/2022) 30 tablet 0   TRULICITY 1.5 MG/0.5ML SOPN INJECT 1.5 MG INTO THE SKIN ONCE A WEEK. (Patient not taking: Reported on 09/18/2022) 15 mL 0   vardenafil (LEVITRA) 10 MG tablet Take 1 tablet (10 mg total) by mouth daily as needed for erectile dysfunction. (Patient not taking: Reported on 11/05/2020) 10 tablet 0   [DISCONTINUED] sildenafil (VIAGRA) 50 MG tablet Take 1 tablet (50 mg total) by mouth daily as needed for erectile dysfunction. 10 tablet 0   [DISCONTINUED] tadalafil (CIALIS) 10 MG tablet Take 1 tablet (10 mg total) by mouth daily as needed for erectile dysfunction. 10 tablet 0   No current facility-administered medications on file prior to visit.    There are no Patient Instructions on file for this visit. No follow-ups on file.   Georgiana Spinner, NP

## 2022-09-23 ENCOUNTER — Telehealth (INDEPENDENT_AMBULATORY_CARE_PROVIDER_SITE_OTHER): Payer: Self-pay

## 2022-09-23 NOTE — Telephone Encounter (Signed)
Spoke with the patient and he is scheduled with Dr. Wyn Quaker on 10/02/22 for a left radialcephalic AVF at the [facility]. Pre- op is on 09/30/22 at 11:00 am at the [facility]. Pre-surgical instructions were discussed  and will be sent to Mychart and mailed.

## 2022-09-30 ENCOUNTER — Encounter
Admission: RE | Admit: 2022-09-30 | Discharge: 2022-09-30 | Disposition: A | Discharge status: Home / Self Care | Payer: BLUE CROSS/BLUE SHIELD | Source: Ambulatory Visit | Attending: Vascular Surgery | Admitting: Vascular Surgery

## 2022-09-30 ENCOUNTER — Other Ambulatory Visit (INDEPENDENT_AMBULATORY_CARE_PROVIDER_SITE_OTHER): Payer: Self-pay | Admitting: Nurse Practitioner

## 2022-09-30 ENCOUNTER — Other Ambulatory Visit: Payer: Self-pay

## 2022-09-30 VITALS — BP 142/93 | HR 80 | Ht 69.0 in | Wt 263.2 lb

## 2022-09-30 DIAGNOSIS — E1121 Type 2 diabetes mellitus with diabetic nephropathy: Secondary | ICD-10-CM

## 2022-09-30 DIAGNOSIS — Z01812 Encounter for preprocedural laboratory examination: Secondary | ICD-10-CM

## 2022-09-30 DIAGNOSIS — Z7984 Long term (current) use of oral hypoglycemic drugs: Secondary | ICD-10-CM | POA: Diagnosis not present

## 2022-09-30 DIAGNOSIS — Z992 Dependence on renal dialysis: Secondary | ICD-10-CM | POA: Diagnosis not present

## 2022-09-30 DIAGNOSIS — E1122 Type 2 diabetes mellitus with diabetic chronic kidney disease: Secondary | ICD-10-CM | POA: Insufficient documentation

## 2022-09-30 DIAGNOSIS — Z01818 Encounter for other preprocedural examination: Secondary | ICD-10-CM | POA: Insufficient documentation

## 2022-09-30 DIAGNOSIS — Z833 Family history of diabetes mellitus: Secondary | ICD-10-CM | POA: Diagnosis not present

## 2022-09-30 DIAGNOSIS — I10 Essential (primary) hypertension: Secondary | ICD-10-CM

## 2022-09-30 DIAGNOSIS — N186 End stage renal disease: Secondary | ICD-10-CM

## 2022-09-30 DIAGNOSIS — E1140 Type 2 diabetes mellitus with diabetic neuropathy, unspecified: Secondary | ICD-10-CM | POA: Diagnosis not present

## 2022-09-30 DIAGNOSIS — I120 Hypertensive chronic kidney disease with stage 5 chronic kidney disease or end stage renal disease: Secondary | ICD-10-CM | POA: Insufficient documentation

## 2022-09-30 DIAGNOSIS — Z87891 Personal history of nicotine dependence: Secondary | ICD-10-CM | POA: Diagnosis not present

## 2022-09-30 HISTORY — DX: Myoneural disorder, unspecified: G70.9

## 2022-09-30 HISTORY — DX: Chronic kidney disease, unspecified: N18.9

## 2022-09-30 HISTORY — DX: Anxiety disorder, unspecified: F41.9

## 2022-09-30 HISTORY — DX: Family history of other specified conditions: Z84.89

## 2022-09-30 LAB — TYPE AND SCREEN
ABO/RH(D): A NEG
Antibody Screen: NEGATIVE

## 2022-09-30 LAB — CBC WITH DIFFERENTIAL/PLATELET
Abs Immature Granulocytes: 0.02 10*3/uL (ref 0.00–0.07)
Basophils Absolute: 0 10*3/uL (ref 0.0–0.1)
Basophils Relative: 1 %
Eosinophils Absolute: 0.1 10*3/uL (ref 0.0–0.5)
Eosinophils Relative: 2 %
HCT: 35.3 % — ABNORMAL LOW (ref 39.0–52.0)
Hemoglobin: 11.2 g/dL — ABNORMAL LOW (ref 13.0–17.0)
Immature Granulocytes: 1 %
Lymphocytes Relative: 23 %
Lymphs Abs: 0.9 10*3/uL (ref 0.7–4.0)
MCH: 29.6 pg (ref 26.0–34.0)
MCHC: 31.7 g/dL (ref 30.0–36.0)
MCV: 93.4 fL (ref 80.0–100.0)
Monocytes Absolute: 0.5 10*3/uL (ref 0.1–1.0)
Monocytes Relative: 11 %
Neutro Abs: 2.6 10*3/uL (ref 1.7–7.7)
Neutrophils Relative %: 62 %
Platelets: 173 10*3/uL (ref 150–400)
RBC: 3.78 MIL/uL — ABNORMAL LOW (ref 4.22–5.81)
RDW: 14.1 % (ref 11.5–15.5)
WBC: 4.1 10*3/uL (ref 4.0–10.5)
nRBC: 0 % (ref 0.0–0.2)

## 2022-09-30 LAB — BASIC METABOLIC PANEL
Anion gap: 16 — ABNORMAL HIGH (ref 5–15)
BUN: 40 mg/dL — ABNORMAL HIGH (ref 6–20)
CO2: 24 mmol/L (ref 22–32)
Calcium: 9.5 mg/dL (ref 8.9–10.3)
Chloride: 98 mmol/L (ref 98–111)
Creatinine, Ser: 7.76 mg/dL — ABNORMAL HIGH (ref 0.61–1.24)
GFR, Estimated: 8 mL/min — ABNORMAL LOW (ref >60)
Glucose, Bld: 88 mg/dL (ref 70–99)
Potassium: 4 mmol/L (ref 3.5–5.1)
Sodium: 138 mmol/L (ref 135–145)

## 2022-09-30 LAB — SURGICAL PCR SCREEN
MRSA, PCR: NEGATIVE
Staphylococcus aureus: NEGATIVE

## 2022-09-30 NOTE — Patient Instructions (Addendum)
Your procedure is scheduled on: 10/02/22 - Thursday Report to the Registration Desk on the 1st floor of the [facility]. To find out your arrival time, please call [phone] between 1PM - 3PM on: 10/01/22 - Wednesday If your arrival time is 6:00 am, do not arrive before that time as the [facility] entrance doors do not open until 6:00 am.  REMEMBER: Instructions that are not followed completely may result in serious medical risk, up to and including death; or upon the discretion of your surgeon and anesthesiologist your surgery may need to be rescheduled.  Do not eat food after midnight the night before surgery.  No gum chewing or hard candies.  You may however, water up to 2 hours before you are scheduled to arrive for your surgery. Do not drink anything within 2 hours of your scheduled arrival time.  One week prior to surgery: Stop Anti-inflammatories (NSAIDS) such as Advil, Aleve, Ibuprofen, Motrin, Naproxen, Naprosyn and Aspirin based products such as Excedrin, Goody's Powder, BC Powder.  Stop ANY OVER THE COUNTER supplements until after surgery.  You may however, continue to take Tylenol if needed for pain up until the day of surgery.   TAKE ONLY THESE MEDICATIONS THE MORNING OF SURGERY WITH A SIP OF WATER:  carvedilol (COREG)    No Alcohol for 24 hours before or after surgery.  No Smoking including e-cigarettes for 24 hours before surgery.  No chewable tobacco products for at least 6 hours before surgery.  No nicotine patches on the day of surgery.  Do not use any "recreational" drugs for at least a week (preferably 2 weeks) before your surgery.  Please be advised that the combination of cocaine and anesthesia may have negative outcomes, up to and including death. If you test positive for cocaine, your surgery will be cancelled.  On the morning of surgery brush your teeth with toothpaste and water, you may rinse your mouth with mouthwash if you wish. Do not swallow  any toothpaste or mouthwash.  Use CHG Soap or wipes as directed on instruction sheet.  Do not wear jewelry, make-up, hairpins, clips or nail polish.  Do not wear lotions, powders, or perfumes.   Do not shave body hair from the neck down 48 hours before surgery.  Contact lenses, hearing aids and dentures may not be worn into surgery.  Do not bring valuables to the hospital. [facility] is not responsible for any missing/lost belongings or valuables.   Bring your C-PAP to the hospital in case you may have to spend the night.   Notify your doctor if there is any change in your medical condition (cold, fever, infection).  Wear comfortable clothing (specific to your surgery type) to the hospital.  After surgery, you can help prevent lung complications by doing breathing exercises.  Take deep breaths and cough every 1-2 hours. Your doctor may order a device called an Incentive Spirometer to help you take deep breaths. When coughing or sneezing, hold a pillow firmly against your incision with both hands. This is called "splinting." Doing this helps protect your incision. It also decreases belly discomfort.  If you are being admitted to the hospital overnight, leave your suitcase in the car. After surgery it may be brought to your room.  In case of increased patient census, it may be necessary for you, the patient, to continue your postoperative care in the Same Day Surgery department.  If you are being discharged the day of surgery, you will not be allowed  to drive home. You will need a responsible individual to drive you home and stay with you for 24 hours after surgery.   If you are taking public transportation, you will need to have a responsible individual with you.  Please call the Pre-admissions Testing Dept. at [phone] if you have any questions about these instructions.  Surgery Visitation Policy:  Patients having surgery or a procedure may have two visitors.  Children  under the age of 25 must have an adult with them who is not the patient.  Inpatient Visitation:    Visiting hours are 7 a.m. to 8 p.m. Up to four visitors are allowed at one time in a patient room. The visitors may rotate out with other people during the day.  One visitor age 52 or older may stay with the patient overnight and must be in the room by 8 p.m. Preparing the Skin Before Surgery     To help prevent the risk of infection at your surgical site, we are now providing you with rinse-free Sage 2% Chlorhexidine Gluconate (CHG) disposable wipes.  Chlorhexidine Gluconate (CHG) Soap  o An antiseptic cleaner that kills germs and bonds with the skin to continue killing germs even after washing  o Used for showering the night before surgery and morning of surgery  The night before surgery: Shower or bathe with warm water. Do not apply perfume, lotions, powders. Wait one hour after shower. Skin should be dry and cool. Open Sage wipe package - use 6 disposable cloths. Wipe body using one cloth for the right arm, one cloth for the left arm, one cloth for the right leg, one cloth for the left leg, one cloth for the chest/abdomen area, and one cloth for the back. Do not use on open wounds or sores. Do not use on face or genitals (private parts). If you are breast feeding, do not use on breasts. 5. Do not rinse, allow to dry. 6. Skin may feel "tacky" for several minutes. 7. Dress in clean clothes. 8. Place clean sheets on your bed and do not sleep with pets.  REPEAT ABOVE ON THE MORNING OF SURGERY BEFORE ARRIVING TO THE HOSPITAL.

## 2022-10-01 MED ORDER — FAMOTIDINE 20 MG PO TABS
20.0000 mg | ORAL_TABLET | Freq: Once | ORAL | Status: AC
  Administered 2022-10-02: 20 mg via ORAL

## 2022-10-01 MED ORDER — CEFAZOLIN SODIUM-DEXTROSE 2-4 GM/100ML-% IV SOLN
2.0000 g | INTRAVENOUS | Status: AC
  Administered 2022-10-02: 2 g via INTRAVENOUS

## 2022-10-01 MED ORDER — CHLORHEXIDINE GLUCONATE 0.12 % MT SOLN
15.0000 mL | Freq: Once | OROMUCOSAL | Status: AC
  Administered 2022-10-02: 15 mL via OROMUCOSAL

## 2022-10-01 MED ORDER — CHLORHEXIDINE GLUCONATE CLOTH 2 % EX PADS: 6.0000 | MEDICATED_PAD | Freq: Once | CUTANEOUS | Status: DC

## 2022-10-01 MED ORDER — ORAL CARE MOUTH RINSE: 15.0000 mL | Freq: Once | OROMUCOSAL | Status: AC

## 2022-10-02 ENCOUNTER — Encounter: Payer: Self-pay | Admitting: Vascular Surgery

## 2022-10-02 ENCOUNTER — Ambulatory Visit: Payer: BLUE CROSS/BLUE SHIELD | Admitting: Urgent Care

## 2022-10-02 ENCOUNTER — Ambulatory Visit
Admission: RE | Admit: 2022-10-02 | Discharge: 2022-10-02 | Disposition: A | Discharge status: Home / Self Care | Payer: BLUE CROSS/BLUE SHIELD | Attending: Vascular Surgery | Admitting: Vascular Surgery

## 2022-10-02 ENCOUNTER — Other Ambulatory Visit: Payer: Self-pay

## 2022-10-02 ENCOUNTER — Encounter
Admission: RE | Disposition: A | Discharge status: Home / Self Care | Payer: Self-pay | Source: Home / Self Care | Attending: Vascular Surgery

## 2022-10-02 ENCOUNTER — Ambulatory Visit: Payer: BLUE CROSS/BLUE SHIELD

## 2022-10-02 ENCOUNTER — Ambulatory Visit: Payer: BLUE CROSS/BLUE SHIELD | Admitting: Anesthesiology

## 2022-10-02 DIAGNOSIS — N186 End stage renal disease: Secondary | ICD-10-CM | POA: Insufficient documentation

## 2022-10-02 DIAGNOSIS — E1122 Type 2 diabetes mellitus with diabetic chronic kidney disease: Secondary | ICD-10-CM | POA: Insufficient documentation

## 2022-10-02 DIAGNOSIS — Z833 Family history of diabetes mellitus: Secondary | ICD-10-CM | POA: Insufficient documentation

## 2022-10-02 DIAGNOSIS — I120 Hypertensive chronic kidney disease with stage 5 chronic kidney disease or end stage renal disease: Secondary | ICD-10-CM | POA: Diagnosis not present

## 2022-10-02 DIAGNOSIS — Z87891 Personal history of nicotine dependence: Secondary | ICD-10-CM | POA: Insufficient documentation

## 2022-10-02 DIAGNOSIS — Z01812 Encounter for preprocedural laboratory examination: Secondary | ICD-10-CM

## 2022-10-02 DIAGNOSIS — E1140 Type 2 diabetes mellitus with diabetic neuropathy, unspecified: Secondary | ICD-10-CM | POA: Insufficient documentation

## 2022-10-02 DIAGNOSIS — E1121 Type 2 diabetes mellitus with diabetic nephropathy: Secondary | ICD-10-CM

## 2022-10-02 DIAGNOSIS — Z992 Dependence on renal dialysis: Secondary | ICD-10-CM | POA: Insufficient documentation

## 2022-10-02 DIAGNOSIS — Z7984 Long term (current) use of oral hypoglycemic drugs: Secondary | ICD-10-CM | POA: Insufficient documentation

## 2022-10-02 HISTORY — PX: AV FISTULA PLACEMENT: SHX1204

## 2022-10-02 LAB — POCT I-STAT, CHEM 8
BUN: 23 mg/dL — ABNORMAL HIGH (ref 6–20)
Calcium, Ion: 1.18 mmol/L (ref 1.15–1.40)
Chloride: 99 mmol/L (ref 98–111)
Creatinine, Ser: 6.6 mg/dL — ABNORMAL HIGH (ref 0.61–1.24)
Glucose, Bld: 98 mg/dL (ref 70–99)
HCT: 34 % — ABNORMAL LOW (ref 39.0–52.0)
Hemoglobin: 11.6 g/dL — ABNORMAL LOW (ref 13.0–17.0)
Potassium: 4.4 mmol/L (ref 3.5–5.1)
Sodium: 139 mmol/L (ref 135–145)
TCO2: 29 mmol/L (ref 22–32)

## 2022-10-02 LAB — ABO/RH: ABO/RH(D): A NEG

## 2022-10-02 LAB — GLUCOSE, CAPILLARY: Glucose-Capillary: 105 mg/dL — ABNORMAL HIGH (ref 70–99)

## 2022-10-02 SURGERY — ARTERIOVENOUS (AV) FISTULA CREATION
Anesthesia: Monitor Anesthesia Care | Laterality: Left

## 2022-10-02 MED ORDER — PROPOFOL 500 MG/50ML IV EMUL
INTRAVENOUS | Status: DC | PRN
  Administered 2022-10-02: 150 ug/kg/min via INTRAVENOUS

## 2022-10-02 MED ORDER — GLYCOPYRROLATE 0.2 MG/ML IJ SOLN
INTRAMUSCULAR | Status: AC
  Filled 2022-10-02: qty 1

## 2022-10-02 MED ORDER — DEXMEDETOMIDINE HCL IN NACL 200 MCG/50ML IV SOLN
INTRAVENOUS | Status: DC | PRN
  Administered 2022-10-02 (×2): 20 ug via INTRAVENOUS

## 2022-10-02 MED ORDER — HYDROCODONE-ACETAMINOPHEN 5-325 MG PO TABS: 2.0000 | ORAL_TABLET | Freq: Four times a day (QID) | ORAL | 0 refills | Status: DC | PRN

## 2022-10-02 MED ORDER — SODIUM CHLORIDE 0.9 % IV SOLN
INTRAVENOUS | Status: DC | PRN
  Administered 2022-10-02: 501 mL via SURGICAL_CAVITY

## 2022-10-02 MED ORDER — HEPARIN SODIUM (PORCINE) 1000 UNIT/ML IJ SOLN
INTRAMUSCULAR | Status: DC | PRN
  Administered 2022-10-02: 4000 [IU] via INTRAVENOUS

## 2022-10-02 MED ORDER — PHENYLEPHRINE HCL (PRESSORS) 10 MG/ML IV SOLN
INTRAVENOUS | Status: DC | PRN
  Administered 2022-10-02 (×3): 160 ug via INTRAVENOUS
  Administered 2022-10-02: 240 ug via INTRAVENOUS
  Administered 2022-10-02 (×2): 160 ug via INTRAVENOUS

## 2022-10-02 MED ORDER — ONDANSETRON HCL 4 MG/2ML IJ SOLN
INTRAMUSCULAR | Status: AC
  Filled 2022-10-02: qty 2

## 2022-10-02 MED ORDER — CHLORHEXIDINE GLUCONATE 0.12 % MT SOLN
OROMUCOSAL | Status: AC
  Filled 2022-10-02: qty 15

## 2022-10-02 MED ORDER — VASOPRESSIN 20 UNIT/ML IV SOLN
INTRAVENOUS | Status: AC
  Filled 2022-10-02: qty 1

## 2022-10-02 MED ORDER — HEMOSTATIC AGENTS (NO CHARGE) OPTIME
TOPICAL | Status: DC | PRN
  Administered 2022-10-02: 1 via TOPICAL

## 2022-10-02 MED ORDER — ONDANSETRON HCL 4 MG/2ML IJ SOLN
INTRAMUSCULAR | Status: DC | PRN
  Administered 2022-10-02: 4 mg via INTRAVENOUS

## 2022-10-02 MED ORDER — GLYCOPYRROLATE 0.2 MG/ML IJ SOLN
INTRAMUSCULAR | Status: DC | PRN
  Administered 2022-10-02: .2 mg via INTRAVENOUS

## 2022-10-02 MED ORDER — LIDOCAINE HCL (CARDIAC) PF 100 MG/5ML IV SOSY
PREFILLED_SYRINGE | INTRAVENOUS | Status: DC | PRN
  Administered 2022-10-02: 100 mg via INTRAVENOUS

## 2022-10-02 MED ORDER — ONDANSETRON HCL 4 MG/2ML IJ SOLN: 4.0000 mg | Freq: Four times a day (QID) | INTRAMUSCULAR | Status: DC | PRN

## 2022-10-02 MED ORDER — BUPIVACAINE HCL (PF) 0.5 % IJ SOLN
INTRAMUSCULAR | Status: DC | PRN
  Administered 2022-10-02: 20 mL via PERINEURAL

## 2022-10-02 MED ORDER — VASOPRESSIN 20 UNIT/ML IV SOLN
INTRAVENOUS | Status: DC | PRN
  Administered 2022-10-02: 1 [IU] via INTRAVENOUS

## 2022-10-02 MED ORDER — PROPOFOL 1000 MG/100ML IV EMUL
INTRAVENOUS | Status: AC
  Filled 2022-10-02: qty 100

## 2022-10-02 MED ORDER — DEXAMETHASONE SODIUM PHOSPHATE 10 MG/ML IJ SOLN
INTRAMUSCULAR | Status: AC
  Filled 2022-10-02: qty 1

## 2022-10-02 MED ORDER — MIDAZOLAM HCL 2 MG/2ML IJ SOLN
INTRAMUSCULAR | Status: DC | PRN
  Administered 2022-10-02: 2 mg via INTRAVENOUS

## 2022-10-02 MED ORDER — SODIUM CHLORIDE 0.9 % IV SOLN: INTRAVENOUS | Status: DC

## 2022-10-02 MED ORDER — FAMOTIDINE 20 MG PO TABS
ORAL_TABLET | ORAL | Status: AC
  Filled 2022-10-02: qty 1

## 2022-10-02 MED ORDER — PROPOFOL 10 MG/ML IV BOLUS
INTRAVENOUS | Status: DC | PRN
  Administered 2022-10-02: 200 mg via INTRAVENOUS
  Administered 2022-10-02: 50 mg via INTRAVENOUS

## 2022-10-02 MED ORDER — HYDROMORPHONE HCL 1 MG/ML IJ SOLN: 1.0000 mg | Freq: Once | INTRAMUSCULAR | Status: DC | PRN

## 2022-10-02 MED ORDER — PROPOFOL 10 MG/ML IV BOLUS
INTRAVENOUS | Status: AC
  Filled 2022-10-02: qty 20

## 2022-10-02 MED ORDER — BUPIVACAINE HCL (PF) 0.5 % IJ SOLN
INTRAMUSCULAR | Status: AC
  Filled 2022-10-02: qty 20

## 2022-10-02 MED ORDER — DEXAMETHASONE SODIUM PHOSPHATE 10 MG/ML IJ SOLN
INTRAMUSCULAR | Status: DC | PRN
  Administered 2022-10-02: 5 mg via INTRAVENOUS
  Administered 2022-10-02: 150 mg via INTRAVENOUS

## 2022-10-02 MED ORDER — LIDOCAINE HCL (PF) 2 % IJ SOLN
INTRAMUSCULAR | Status: DC | PRN
  Administered 2022-10-02: 5 mL via PERINEURAL

## 2022-10-02 MED ORDER — HEPARIN SODIUM (PORCINE) 5000 UNIT/ML IJ SOLN
INTRAMUSCULAR | Status: AC
  Filled 2022-10-02: qty 1

## 2022-10-02 MED ORDER — PHENYLEPHRINE HCL-NACL 20-0.9 MG/250ML-% IV SOLN
INTRAVENOUS | Status: AC
  Filled 2022-10-02: qty 250

## 2022-10-02 MED ORDER — FENTANYL CITRATE (PF) 100 MCG/2ML IJ SOLN
INTRAMUSCULAR | Status: AC
  Filled 2022-10-02: qty 2

## 2022-10-02 MED ORDER — MIDAZOLAM HCL 2 MG/2ML IJ SOLN
INTRAMUSCULAR | Status: AC
  Filled 2022-10-02: qty 2

## 2022-10-02 MED ORDER — PHENYLEPHRINE HCL-NACL 20-0.9 MG/250ML-% IV SOLN
INTRAVENOUS | Status: DC | PRN
  Administered 2022-10-02: 30 ug/min via INTRAVENOUS

## 2022-10-02 MED ORDER — CEFAZOLIN SODIUM-DEXTROSE 2-4 GM/100ML-% IV SOLN
INTRAVENOUS | Status: AC
  Filled 2022-10-02: qty 100

## 2022-10-02 MED ORDER — SUCCINYLCHOLINE CHLORIDE 200 MG/10ML IV SOSY
PREFILLED_SYRINGE | INTRAVENOUS | Status: DC | PRN
  Administered 2022-10-02: 120 mg via INTRAVENOUS

## 2022-10-02 MED ORDER — LIDOCAINE HCL (PF) 2 % IJ SOLN
INTRAMUSCULAR | Status: AC
  Filled 2022-10-02: qty 5

## 2022-10-02 MED ORDER — FENTANYL CITRATE PF 50 MCG/ML IJ SOSY
PREFILLED_SYRINGE | INTRAMUSCULAR | Status: AC
  Filled 2022-10-02: qty 1

## 2022-10-02 MED ORDER — FENTANYL CITRATE (PF) 100 MCG/2ML IJ SOLN
INTRAMUSCULAR | Status: DC | PRN
  Administered 2022-10-02: 50 ug via INTRAVENOUS

## 2022-10-02 SURGICAL SUPPLY — 51 items
ADH SKN CLS APL DERMABOND .7 (GAUZE/BANDAGES/DRESSINGS) ×1
APL PRP STRL LF DISP 70% ISPRP (MISCELLANEOUS) ×1
BAG DECANTER FOR FLEXI CONT (MISCELLANEOUS) ×1 IMPLANT
BLADE SURG SZ11 CARB STEEL (BLADE) ×1 IMPLANT
BOOT SUTURE VASCULAR YLW (MISCELLANEOUS) ×1
BRUSH SCRUB EZ 4% CHG (MISCELLANEOUS) ×1 IMPLANT
CHLORAPREP W/TINT 26 (MISCELLANEOUS) ×1 IMPLANT
CLAMP SUTURE YELLOW 5 PAIRS (MISCELLANEOUS) ×1 IMPLANT
CLIP SPRNG 6 S-JAW DBL (CLIP) ×1 IMPLANT
CLIP SPRNG 6MM S-JAW DBL (CLIP) ×1
DERMABOND ADVANCED .7 DNX12 (GAUZE/BANDAGES/DRESSINGS) ×1 IMPLANT
DRESSING SURGICEL FIBRLLR 1X2 (HEMOSTASIS) ×1 IMPLANT
DRSG SURGICEL FIBRILLAR 1X2 (HEMOSTASIS) ×1
ELECT CAUTERY BLADE 6.4 (BLADE) ×1 IMPLANT
ELECT REM PT RETURN 9FT ADLT (ELECTROSURGICAL) ×1
ELECTRODE REM PT RTRN 9FT ADLT (ELECTROSURGICAL) ×1 IMPLANT
GLOVE BIO SURGEON STRL SZ7 (GLOVE) ×2 IMPLANT
GOWN STRL REUS W/ TWL LRG LVL3 (GOWN DISPOSABLE) ×2 IMPLANT
GOWN STRL REUS W/ TWL XL LVL3 (GOWN DISPOSABLE) ×2 IMPLANT
GOWN STRL REUS W/TWL LRG LVL3 (GOWN DISPOSABLE) ×2
GOWN STRL REUS W/TWL XL LVL3 (GOWN DISPOSABLE) ×2
IV NS 500ML (IV SOLUTION) ×1
IV NS 500ML BAXH (IV SOLUTION) ×1 IMPLANT
KIT TURNOVER KIT A (KITS) ×1 IMPLANT
LABEL OR SOLS (LABEL) ×1 IMPLANT
LOOP VESSEL MAXI 1X406 RED (MISCELLANEOUS) ×1 IMPLANT
LOOP VESSEL MINI 0.8X406 BLUE (MISCELLANEOUS) ×1 IMPLANT
MANIFOLD NEPTUNE II (INSTRUMENTS) ×1 IMPLANT
NDL FILTER BLUNT 18X1 1/2 (NEEDLE) ×1 IMPLANT
NEEDLE FILTER BLUNT 18X1 1/2 (NEEDLE) ×1 IMPLANT
NS IRRIG 500ML POUR BTL (IV SOLUTION) ×1 IMPLANT
PACK EXTREMITY ARMC (MISCELLANEOUS) ×1 IMPLANT
PAD PREP OB/GYN DISP 24X41 (PERSONAL CARE ITEMS) ×1 IMPLANT
SOLUTION CELL SAVER (CLIP) ×1 IMPLANT
STOCKINETTE 48X4 2 PLY STRL (GAUZE/BANDAGES/DRESSINGS) ×1 IMPLANT
STOCKINETTE STRL 4IN 9604848 (GAUZE/BANDAGES/DRESSINGS) ×1 IMPLANT
SUT MNCRL AB 4-0 PS2 18 (SUTURE) ×1 IMPLANT
SUT PROLENE 6 0 BV (SUTURE) ×4 IMPLANT
SUT SILK 2 0 (SUTURE) ×1
SUT SILK 2-0 18XBRD TIE 12 (SUTURE) ×1 IMPLANT
SUT SILK 3 0 (SUTURE) ×1
SUT SILK 3-0 18XBRD TIE 12 (SUTURE) ×1 IMPLANT
SUT SILK 4 0 (SUTURE) ×1
SUT SILK 4-0 18XBRD TIE 12 (SUTURE) ×1 IMPLANT
SUT VIC AB 3-0 SH 27 (SUTURE) ×1
SUT VIC AB 3-0 SH 27X BRD (SUTURE) ×1 IMPLANT
SYR 20ML LL LF (SYRINGE) ×1 IMPLANT
SYR 3ML LL SCALE MARK (SYRINGE) ×1 IMPLANT
TAG SUTURE CLAMP YLW 5PR (MISCELLANEOUS) ×1
TRAP FLUID SMOKE EVACUATOR (MISCELLANEOUS) ×1 IMPLANT
WATER STERILE IRR 500ML POUR (IV SOLUTION) ×1 IMPLANT

## 2022-10-02 NOTE — Interval H&P Note (Signed)
History and Physical Interval Note:  10/02/2022 7:23 AM  [name] Walker  has presented today for surgery, with the diagnosis of ESRD.  The various methods of treatment have been discussed with the patient and family. After consideration of risks, benefits and other options for treatment, the patient has consented to  Procedure(s): ARTERIOVENOUS (AV) FISTULA CREATION (RADIALCEPHALIC) (Left) as a surgical intervention.  The patient's history has been reviewed, patient examined, no change in status, stable for surgery.  I have reviewed the patient's chart and labs.  Questions were answered to the patient's satisfaction.     Festus Barren

## 2022-10-02 NOTE — Discharge Instructions (Signed)
AMBULATORY SURGERY  DISCHARGE INSTRUCTIONS   The drugs that you were given will stay in your system until tomorrow so for the next 24 hours you should not:  Drive an automobile Make any legal decisions Drink any alcoholic beverage   You may resume regular meals tomorrow.  Today it is better to start with liquids and gradually work up to solid foods.  You may eat anything you prefer, but it is better to start with liquids, then soup and crackers, and gradually work up to solid foods.   Please notify your doctor immediately if you have any unusual bleeding, trouble breathing, redness and pain at the surgery site, drainage, fever, or pain not relieved by medication.    Additional Instructions:   Please contact your physician with any problems or Same Day Surgery at [phone], Monday through Friday 6 am to 4 pm, or [facility] number at [phone]. Interscalene Nerve Block (ISNB) Discharge Instructions    For your surgery you have received an Interscalene Nerve Block. Nerve Blocks affect many types of nerves, including nerves that control movement, pain and normal sensation.  You may experience feelings such as numbness, tingling, heaviness, weakness or the inability to move your arm or the feeling or sensation that your arm has "fallen asleep". A nerve block can last for 2 - 36 hours or more depending on the medication used.  Usually the weakness wears off first.  The tingling and heaviness usually wear off next.  Finally you may start to notice pain.  Keep in mind that this may occur in any order.  once a nerve block starts to wear off it is usually completely gone within 60 minutes. ISNB may cause mild shortness of breath, a hoarse voice, blurry vision, unequal pupils, or drooping of the face on the same side as the nerve block.  These symptoms will usually go away within 12 hours.  Very rarely the procedure itself can cause mild seizures. If needed, your surgeon  will give you a prescription for pain medication.  It will take about 60 minutes for the oral pain medication to become fully effective.  So, it is recommended that you start taking this medication before the nerve block first begins to wear off, or when you first begin to feel discomfort. Keep in mind that nerve blocks often wear off in the middle of the night.   If you are going to bed and the block has not started to wear off or you have not started to have any discomfort, consider setting an alarm for 2 to 3 hours, so you can assess your block.  If you notice the block is wearing off or you are starting to have discomfort, you can take your pain medication. Take your pain medication only as prescribed.  Pain medication can cause sedation and decrease your breathing if you take more than you need for the level of pain that you have. Nausea is a common side effect of many pain medications.  You may want to eat something before taking your pain medicine to prevent nausea. After an Interscalene nerve block, you cannot feel pain, pressure or extremes in temperature in the effected arm.  Because your arm is numb it is at an increased risk for injury.  To decrease the possibility of injury, please practice the following:  While you are awake change the position of your arm frequently to prevent too much pressure on any one area for prolonged periods of time.    If you have a cast or tight dressing, check the color or your fingers every couple of hours.  Call your surgeon with the appearance of any discoloration (white or blue). If you are given a sling to wear before you go home, please wear it  at all times until the block has completely worn off.  Do not get up at night without your sling. If you experience any problems or concerns, please contact your surgeon's office. If you experience severe or prolonged shortness of breath go to the nearest emergency department.  

## 2022-10-02 NOTE — Transfer of Care (Signed)
Immediate Anesthesia Transfer of Care Note  Patient: [name] Walker  Procedure(s) Performed: Procedure(s): ARTERIOVENOUS (AV) FISTULA CREATION (RADIALCEPHALIC) (Left)  Patient Location: PACU  Anesthesia Type:General  Level of Consciousness: sedated  Airway & Oxygen Therapy: Patient Spontanous Breathing and Patient connected to face mask oxygen  Post-op Assessment: Report given to RN and Post -op Vital signs reviewed and stable  Post vital signs: Reviewed and stable  Last Vitals:  Vitals:   10/02/22 0728 10/02/22 0907  BP: 128/88 136/80  Pulse: 82 87  Resp:  (!) 26  Temp:  (!) 36.4 C  SpO2:  97%    Complications: No apparent anesthesia complications

## 2022-10-02 NOTE — Anesthesia Procedure Notes (Signed)
Anesthesia Regional Block: Supraclavicular block   Pre-Anesthetic Checklist: , timeout performed,  Correct Patient, Correct Site, Correct Laterality,  Correct Procedure, Correct Position, site marked,  Risks and benefits discussed,  Surgical consent,  Pre-op evaluation,  At surgeon's request and post-op pain management  Laterality: Upper and Left  Prep: chloraprep       Needles:  Injection technique: Single-shot  Needle Type: Stimiplex     Needle Length: 9cm  Needle Gauge: 22     Additional Needles:   Procedures:,,,, ultrasound used (permanent image in chart),,    Narrative:  Start time: 10/02/2022 7:26 AM End time: 10/02/2022 7:29 AM Injection made incrementally with aspirations every 5 mL.  Performed by: Personally  Anesthesiologist: Foye Deer, MD  Additional Notes: Patient consented for risk and benefits of nerve block including but not limited to nerve damage, failed block, bleeding and infection.  Patient voiced understanding.  Functioning IV was confirmed and monitors were applied.  Timeout done prior to procedure and prior to any sedation being given to the patient.  Patient confirmed procedure site prior to any sedation given to the patient. Sterile prep,hand hygiene and sterile gloves were used.  Minimal sedation used for procedure.  No paresthesia endorsed by patient during the procedure.  Negative aspiration and negative test dose prior to incremental administration of local anesthetic. The patient tolerated the procedure well with no immediate complications.

## 2022-10-02 NOTE — Anesthesia Postprocedure Evaluation (Signed)
Anesthesia Post Note  Patient: [name] Walker  Procedure(s) Performed: ARTERIOVENOUS (AV) FISTULA CREATION (RADIALCEPHALIC) (Left)  Patient location during evaluation: PACU Anesthesia Type: MAC Level of consciousness: awake and alert Pain management: pain level controlled Vital Signs Assessment: post-procedure vital signs reviewed and stable Respiratory status: spontaneous breathing, nonlabored ventilation, respiratory function stable and patient connected to nasal cannula oxygen Cardiovascular status: blood pressure returned to baseline and stable Postop Assessment: no apparent nausea or vomiting Anesthetic complications: no  No notable events documented.   Last Vitals:  Vitals:   10/02/22 0948 10/02/22 1007  BP: 93/61 (!) 85/57  Pulse: 88 100  Resp: (!) 22 19  Temp: (!) 36.2 C (!) 36.1 C  SpO2: 95% 93%    Last Pain:  Vitals:   10/02/22 1007  TempSrc: Temporal  PainSc: 0-No pain                 Stephanie Coup

## 2022-10-02 NOTE — Anesthesia Preprocedure Evaluation (Signed)
Anesthesia Evaluation  Patient identified by MRN, date of birth, ID band Patient awake    Reviewed: Allergy & Precautions, H&P , NPO status , Patient's Chart, lab work & pertinent test results, reviewed documented beta blocker date and time   Airway Mallampati: III  TM Distance: >3 FB Neck ROM: full    Dental no notable dental hx.    Pulmonary Current Smoker and Patient abstained from smoking.   Pulmonary exam normal        Cardiovascular hypertension, Pt. on home beta blockers and Pt. on medications Normal cardiovascular exam     Neuro/Psych  PSYCHIATRIC DISORDERS Anxiety      Neuromuscular disease (complains of intermittent bilateral finger numbness. Today, his right fingers feel numb. It does not correlate with neck position. He complains of sciatica)    GI/Hepatic negative GI ROS, Neg liver ROS,,,  Endo/Other  diabetes, Well Controlled, Type 2    Renal/GU ESRF and DialysisRenal disease     Musculoskeletal   Abdominal  (+) + obese  Peds  Hematology negative hematology ROS (+)   Anesthesia Other Findings Past Medical History: No date: Anxiety No date: Chronic kidney disease     Comment:  ESRD No date: Diabetes mellitus without complication (HCC)     Comment:  hgb A!C 6.0 No date: Family history of adverse reaction to anesthesia     Comment:  mother states that she was able to feel everything               during a procedure No date: Hypertension No date: Neuromuscular disorder (HCC) No date: Pulmonary embolism (HCC) No date: Sleep apnea  Past Surgical History: 06/25/2022: DIALYSIS/PERMA CATHETER INSERTION; N/A     Comment:  Procedure: DIALYSIS/PERMA CATHETER INSERTION;  Surgeon:               Annice Needy, MD;  Location: [facility] INVASIVE CV LAB;                Service: Cardiovascular;  Laterality: N/A; 1985: TONSILLECTOMY No date: UMBILICAL HERNIA REPAIR  BMI    Body Mass Index: 38.40 kg/m       Reproductive/Obstetrics negative OB ROS                              Anesthesia Physical Anesthesia Plan  ASA: 3  Anesthesia Plan: MAC   Post-op Pain Management: Regional block*   Induction:   PONV Risk Score and Plan:   Airway Management Planned: Natural Airway  Additional Equipment:   Intra-op Plan:   Post-operative Plan:   Informed Consent: I have reviewed the patients History and Physical, chart, labs and discussed the procedure including the risks, benefits and alternatives for the proposed anesthesia with the patient or authorized representative who has indicated his/her understanding and acceptance.       Plan Discussed with: Anesthesiologist, CRNA and Surgeon  Anesthesia Plan Comments: (Back-up GETA discussed)         Anesthesia Quick Evaluation

## 2022-10-02 NOTE — Anesthesia Procedure Notes (Signed)
Procedure Name: Intubation Date/Time: 10/02/2022 8:00 AM  Performed by: Stormy Fabian, CRNAPre-anesthesia Checklist: Patient identified, Patient being monitored, Timeout performed, Emergency Drugs available and Suction available Patient Re-evaluated:Patient Re-evaluated prior to induction Oxygen Delivery Method: Circle system utilized Preoxygenation: Pre-oxygenation with 100% oxygen Induction Type: IV induction Ventilation: Mask ventilation without difficulty Laryngoscope Size: Mac and 4 Grade View: Grade I Tube type: Oral Tube size: 7.0 mm Number of attempts: 1 Airway Equipment and Method: Stylet Placement Confirmation: ETT inserted through vocal cords under direct vision, positive ETCO2 and breath sounds checked- equal and bilateral Secured at: 22 cm Tube secured with: Tape Dental Injury: Teeth and Oropharynx as per pre-operative assessment

## 2022-10-02 NOTE — Op Note (Signed)
[facility]   OPERATIVE NOTE   PROCEDURE: Left radiocephaic arteriovenous fistula placement  PRE-OPERATIVE DIAGNOSIS: 1. ESRD   POST-OPERATIVE DIAGNOSIS: Same  SURGEON: Festus Barren, MD  ASSISTANT(S): none  ANESTHESIA: general  ESTIMATED BLOOD LOSS: 10 cc  FINDING(S): none  SPECIMEN(S):  None  INDICATIONS:   [name] Walker is a 47 y.o. male who presents with renal failure.  The patient is scheduled for left radiocephalic arteriovenous fistula placement when non-invasive studies suggested adequate anatomy for fistula creation at this location.  The patient is aware the risks include but are not limited to: bleeding, infection, steal syndrome, nerve damage, ischemic monomelic neuropathy, failure to mature, and need for additional procedures.  The patient is aware of the risks of the procedure and elects to proceed forward.  DESCRIPTION: After full informed written consent was obtained from the patient, the patient was brought back to the operating room and placed supine upon the operating table.  Prior to induction, the patient received IV antibiotics.   After obtaining adequate anesthesia, the patient was then prepped and draped in the standard fashion for a left arm access procedure.  I turned my attention first to identifying the patient's distal cephalic vein and radial artery.  I made an incision at the level of the distal forearm and wrist and dissected through the subcutaneous tissue and fascia to gain exposure of the radial artery.  This was noted to be useable for fistula creation.  This was dissected out proximally and distally and controlled with vessel loops.  I then dissected out the cephalic vein.  This was noted to be patent and adequate size for fistula creation. I then gave the patient 4000 units of intravenous heparin.  The distal segment of the vein was ligated with a  2-0 silk, and the vein was transected.  I then instilled the heparinized saline  into the vein and clamped it.  At this point, I reset my exposure of the radial artery and placed the artery under tension proximally and distally.  I made an arteriotomy with a #11 blade, and then I extended the arteriotomy with a Potts scissor.  I injected heparinized saline proximal and distal to this arteriotomy.  The vein was then sewn to the artery in an end-to-side configuration with a running stitch of 6-0 Prolene.  Prior to completing this anastomosis, I allowed the vein and artery to backbleed.  There was no evidence of clot from any vessels.  I completed the anastomosis in the usual fashion and then released all vessel loops and clamps.  There was a palpable  thrill in the venous outflow, and there was a palpable radial pulse beyond the anastomosis.  At this point, I irrigated out the surgical wound. Surgicel was placed. There was no further active bleeding.  The subcutaneous tissue was reapproximated with a running stitch of 3-0 Vicryl.  The skin was then reapproximated with a running subcuticular stitch of 4-0 Monocryl.  The skin was then cleaned, dried, and reinforced with Dermabond.  The patient tolerated this procedure well and was taken to the recovery room in stable condition  COMPLICATIONS: None  CONDITION: Stable   Festus Barren, MD 10/02/2022 9:03 AM   This note was created with Dragon Medical transcription system. Any errors in dictation are purely unintentional.

## 2022-10-03 ENCOUNTER — Encounter: Payer: Self-pay | Admitting: Vascular Surgery

## 2022-11-03 ENCOUNTER — Other Ambulatory Visit (INDEPENDENT_AMBULATORY_CARE_PROVIDER_SITE_OTHER): Payer: Self-pay | Admitting: Vascular Surgery

## 2022-11-03 DIAGNOSIS — Z9889 Other specified postprocedural states: Secondary | ICD-10-CM

## 2022-11-03 DIAGNOSIS — N186 End stage renal disease: Secondary | ICD-10-CM

## 2022-11-13 ENCOUNTER — Encounter (INDEPENDENT_AMBULATORY_CARE_PROVIDER_SITE_OTHER): Payer: Self-pay | Admitting: Nurse Practitioner

## 2022-11-13 ENCOUNTER — Ambulatory Visit (INDEPENDENT_AMBULATORY_CARE_PROVIDER_SITE_OTHER): Payer: BC Managed Care – PPO

## 2022-11-13 ENCOUNTER — Ambulatory Visit (INDEPENDENT_AMBULATORY_CARE_PROVIDER_SITE_OTHER): Payer: BC Managed Care – PPO | Admitting: Nurse Practitioner

## 2022-11-13 VITALS — BP 114/77 | HR 80 | Resp 16 | Wt 265.6 lb

## 2022-11-13 DIAGNOSIS — Z9889 Other specified postprocedural states: Secondary | ICD-10-CM

## 2022-11-13 DIAGNOSIS — N186 End stage renal disease: Secondary | ICD-10-CM

## 2022-11-13 DIAGNOSIS — I10 Essential (primary) hypertension: Secondary | ICD-10-CM

## 2022-11-13 DIAGNOSIS — E1121 Type 2 diabetes mellitus with diabetic nephropathy: Secondary | ICD-10-CM

## 2022-11-13 NOTE — Progress Notes (Signed)
Subjective:    Patient ID: [name] Walker, male    DOB: November 25, 1975, 47 y.o.   MRN: [id number] Chief Complaint  Patient presents with   Follow-up    [facility] 6 week with HDA    The patient returns to the office for followup of left radiocephalic AV fistula placement on 10/02/2022.  He has been healing well with just a small superficial area of scabbing but that appears to be healing well.  He is currently maintained via PermCath.  He has not yet utilized to access the fistula.  The patient denies an increase in arm swelling. At the present time the patient denies hand pain.  No recent shortening of the patient's walking distance or new symptoms consistent with claudication.  No history of rest pain symptoms. No new ulcers or wounds of the lower extremities have occurred.  The patient denies amaurosis fugax or recent TIA symptoms. There are no recent neurological changes noted. There is no history of DVT, PE or superficial thrombophlebitis. No recent episodes of angina or shortness of breath documented.   Duplex ultrasound of the AV access shows a patent access.  The patient has a flow volume of 1043.  No significant stenosis noted.  AV fistula is patent.       Review of Systems  All other systems reviewed and are negative.      Objective:   Physical Exam Vitals reviewed.  HENT:     Head: Normocephalic.  Cardiovascular:     Rate and Rhythm: Normal rate.     Pulses:          Radial pulses are 2+ on the left side.     Arteriovenous access: Left arteriovenous access is present.    Comments: Good thrill and bruit Pulmonary:     Effort: Pulmonary effort is normal.  Skin:    General: Skin is warm and dry.  Neurological:     Mental Status: He is alert and oriented to person, place, and time.  Psychiatric:        Mood and Affect: Mood normal.        Behavior: Behavior normal.        Thought Content: Thought content normal.        Judgment: Judgment normal.     BP 114/77 (BP  Location: Right Arm)   Pulse 80   Resp 16   Wt 265 lb 9.6 oz (120.5 kg)   BMI 39.22 kg/m   Past Medical History:  Diagnosis Date   Anxiety    Chronic kidney disease    ESRD   Diabetes mellitus without complication (HCC)    hgb A!C 6.0   Family history of adverse reaction to anesthesia    mother states that she was able to feel everything during a procedure   Hypertension    Neuromuscular disorder (HCC)    Pulmonary embolism (HCC)    Sleep apnea     Social History   Socioeconomic History   Marital status: Married    Spouse name: ,Alvy Beal   Number of children: 3   Years of education: Not on file   Highest education level: Not on file  Occupational History   Occupation: Paediatric nurse  Tobacco Use   Smoking status: Some Days    Types: Cigarettes, Cigars   Smokeless tobacco: Never  Vaping Use   Vaping status: Some Days   Substances: Nicotine, Flavoring  Substance and Sexual Activity   Alcohol use: Yes    Comment: 1 glass  of wine daily   Drug use: Never   Sexual activity: Yes    Birth control/protection: None  Other Topics Concern   Not on file  Social History Narrative   Not on file   Social Determinants of Health   Financial Resource Strain: Not on file  Food Insecurity: Not on file  Transportation Needs: Not on file  Physical Activity: Insufficiently Active (09/14/2018)   Exercise Vital Sign    Days of Exercise per Week: 1 day    Minutes of Exercise per Session: 10 min  Stress: No Stress Concern Present (09/14/2018)   [employer] of Occupational Health - Occupational Stress Questionnaire    Feeling of Stress : Only a little  Social Connections: Moderately Integrated (09/14/2018)   Social Connection and Isolation Panel [NHANES]    Frequency of Communication with Friends and Family: Twice a week    Frequency of Social Gatherings with Friends and Family: Twice a week    Attends Religious Services: 1 to 4 times per year    Active Member of [employer] or  Organizations: No    Attends Banker Meetings: Never    Marital Status: Married  Catering manager Violence: Not At Risk (09/14/2018)   Humiliation, Afraid, Rape, and Kick questionnaire    Fear of Current or Ex-Partner: No    Emotionally Abused: No    Physically Abused: No    Sexually Abused: No    Past Surgical History:  Procedure Laterality Date   AV FISTULA PLACEMENT Left 10/02/2022   Procedure: ARTERIOVENOUS (AV) FISTULA CREATION (RADIALCEPHALIC);  Surgeon: Annice Needy, MD;  Location: [facility] ORS;  Service: Vascular;  Laterality: Left;   DIALYSIS/PERMA CATHETER INSERTION N/A 06/25/2022   Procedure: DIALYSIS/PERMA CATHETER INSERTION;  Surgeon: Annice Needy, MD;  Location: [facility] INVASIVE CV LAB;  Service: Cardiovascular;  Laterality: N/A;   TONSILLECTOMY  1985   UMBILICAL HERNIA REPAIR      Family History  Problem Relation Age of Onset   Diabetes Mother    Cancer Mother        lung    Allergies  Allergen Reactions   Ace Inhibitors Cough   Amlodipine     Pedal edema   Daptomycin Other (See Comments)    Makes CK levels go very high   Iodinated Contrast Media     afftected his kidneys    Metformin And Related     diarrhea   Other     Seasonal allergies        Latest Ref Rng & Units 10/02/2022    6:45 AM 09/30/2022   11:45 AM 11/07/2020    9:37 AM  CBC  WBC 4.0 - 10.5 K/uL  4.1  4.7   Hemoglobin 13.0 - 17.0 g/dL 40.9  81.1  91.4   Hematocrit 39.0 - 52.0 % 34.0  35.3  49.3   Platelets 150 - 400 K/uL  173  177       CMP     Component Value Date/Time   NA 139 10/02/2022 0645   NA 143 11/07/2020 0937   NA 139 03/31/2013 1318   K 4.4 10/02/2022 0645   K 3.3 (L) 03/31/2013 1318   CL 99 10/02/2022 0645   CL 99 03/31/2013 1318   CO2 24 09/30/2022 1145   CO2 32 03/31/2013 1318   GLUCOSE 98 10/02/2022 0645   GLUCOSE 141 (H) 03/31/2013 1318   BUN 23 (H) 10/02/2022 0645   BUN 13 11/07/2020 0937   BUN 11 03/31/2013  1318   CREATININE 6.60 (H) 10/02/2022 0645    CREATININE 0.83 03/31/2013 1318   CALCIUM 9.5 09/30/2022 1145   CALCIUM 10.1 03/31/2013 1318   PROT 6.1 11/07/2020 0937   PROT 8.1 03/01/2012 0205   ALBUMIN 3.0 (L) 11/07/2020 0937   ALBUMIN 3.5 03/01/2012 0205   AST 23 11/07/2020 0937   AST 163 (H) 03/01/2012 0205   ALT 17 11/07/2020 0937   ALT 181 (H) 03/01/2012 0205   ALKPHOS 100 11/07/2020 0937   ALKPHOS 74 03/01/2012 0205   BILITOT 0.3 11/07/2020 0937   BILITOT 0.3 03/01/2012 0205   EGFR 38 (L) 11/07/2020 0937   GFRNONAA 8 (L) 09/30/2022 1145   GFRNONAA >60 03/31/2013 1318     No results found.     Assessment & Plan:   1. ESRD (end stage renal disease) (HCC) Recommend:  The patient is doing well and currently has adequate dialysis access. Today we have faxed a letter to his dialysis center to indicate that is left radiocephalic AV fistula is adequate for use.  The patient should have a duplex ultrasound of the dialysis access in 6 months. The patient will follow-up with me in the office after each ultrasound    2. Diabetes mellitus with nephropathy (HCC) Continue hypoglycemic medications as already ordered, these medications have been reviewed and there are no changes at this time.  Hgb A1C to be monitored as already arranged by primary service  3. Essential (primary) hypertension Continue antihypertensive medications as already ordered, these medications have been reviewed and there are no changes at this time.   Current Outpatient Medications on File Prior to Visit  Medication Sig Dispense Refill   acetaminophen (TYLENOL) 500 MG tablet Take 500 mg by mouth every 6 (six) hours as needed.     carvedilol (COREG) 25 MG tablet Take 25 mg by mouth 2 (two) times daily with a meal.     cholecalciferol (VITAMIN D3) 25 MCG (1000 UNIT) tablet Take 1,000 Units by mouth daily.     cyanocobalamin (VITAMIN B12) 1000 MCG tablet Take 1,000 mcg by mouth 4 (four) times a week. Takes on non - dialysis days     gabapentin  (NEURONTIN) 600 MG tablet TAKE 1 TABLET BY MOUTH THREE TIMES A DAY 270 tablet 2   hydrALAZINE (APRESOLINE) 25 MG tablet Take 25 mg by mouth 3 (three) times a week. Taking on dialysis days     HYDROcodone-acetaminophen (NORCO/VICODIN) 5-325 MG tablet Take 2 tablets by mouth every 6 (six) hours as needed for moderate pain. 20 tablet 0   losartan (COZAAR) 50 MG tablet Take 1 tablet (50 mg total) by mouth daily. 90 tablet 1   NON FORMULARY CPAP everynight     sertraline (ZOLOFT) 50 MG tablet Take 50 mg by mouth daily.     torsemide (DEMADEX) 20 MG tablet Take 20 mg by mouth. Tues, Thursday, Saturday,Sunday     TRULICITY 1.5 MG/0.5ML SOPN INJECT 1.5 MG INTO THE SKIN ONCE A WEEK. 15 mL 0   hydrochlorothiazide (HYDRODIURIL) 25 MG tablet Take 1 tablet (25 mg total) by mouth daily. (Patient not taking: Reported on 09/18/2022) 90 tablet 1   HYDROcodone-acetaminophen (NORCO/VICODIN) 5-325 MG tablet Take 1/2 tablet every 12 hours for pain. (Patient not taking: Reported on 11/05/2020) 5 tablet 0   metoprolol succinate (TOPROL-XL) 50 MG 24 hr tablet TAKE ONE TABLET DAILY. TAKE WITH OR IMMEDIATELY FOLLOWING A MEAL. (Patient not taking: Reported on 09/18/2022) 30 tablet 0   triamcinolone cream (KENALOG) 0.1 %  APPLY TO AFFECTED AREA TWICE A DAY (Patient not taking: Reported on 10/02/2022) 30 g 0   vardenafil (LEVITRA) 10 MG tablet Take 1 tablet (10 mg total) by mouth daily as needed for erectile dysfunction. (Patient not taking: Reported on 11/05/2020) 10 tablet 0   [DISCONTINUED] sildenafil (VIAGRA) 50 MG tablet Take 1 tablet (50 mg total) by mouth daily as needed for erectile dysfunction. 10 tablet 0   [DISCONTINUED] tadalafil (CIALIS) 10 MG tablet Take 1 tablet (10 mg total) by mouth daily as needed for erectile dysfunction. 10 tablet 0   No current facility-administered medications on file prior to visit.    There are no Patient Instructions on file for this visit. No follow-ups on file.   Georgiana Spinner,  NP

## 2022-12-08 ENCOUNTER — Telehealth (INDEPENDENT_AMBULATORY_CARE_PROVIDER_SITE_OTHER): Payer: Self-pay

## 2022-12-08 NOTE — Telephone Encounter (Signed)
Spoke with the patient and he is scheduled with Dr. Gilda Crease for a permcath removal on 12/16/22 with a 2:00 pm arrival time. Pre-procedure instructions were discussed and will be sent to Mychart and mailed.

## 2022-12-10 ENCOUNTER — Telehealth (INDEPENDENT_AMBULATORY_CARE_PROVIDER_SITE_OTHER): Payer: Self-pay

## 2022-12-10 NOTE — Telephone Encounter (Signed)
Spoke with the patient and per him his insurance states that if the secondary is filed then it will pay even though his primary is out of network.  Patient wanted to speak with someone about this and was given billings number. Patient called and spoke with them and called back to let me know he was told by billing that the secondary should be filed and it would pay as long as our office has gotten prior [place] for the secondary insurance. Patient stated he wants to speak with someone else because if his insurance and billing are stating this what is going on, then what is the problem.

## 2022-12-10 NOTE — Telephone Encounter (Signed)
I attempted to contact the patient to give him information regarding his procedure with Dr. Wyn Quaker that is scheduled on 12/16/22. A message was left for a return call.

## 2022-12-16 ENCOUNTER — Encounter: Admission: RE | Payer: Self-pay | Source: Home / Self Care

## 2022-12-16 ENCOUNTER — Ambulatory Visit
Admission: RE | Admit: 2022-12-16 | Payer: BLUE CROSS/BLUE SHIELD | Source: Home / Self Care | Admitting: Vascular Surgery

## 2022-12-16 DIAGNOSIS — N186 End stage renal disease: Secondary | ICD-10-CM

## 2022-12-16 SURGERY — DIALYSIS/PERMA CATHETER REMOVAL
Anesthesia: LOCAL

## 2023-05-05 ENCOUNTER — Encounter: Payer: Self-pay | Admitting: Nurse Practitioner

## 2023-05-14 ENCOUNTER — Other Ambulatory Visit (INDEPENDENT_AMBULATORY_CARE_PROVIDER_SITE_OTHER): Payer: Self-pay | Admitting: Nurse Practitioner

## 2023-05-14 DIAGNOSIS — N186 End stage renal disease: Secondary | ICD-10-CM

## 2023-05-19 ENCOUNTER — Ambulatory Visit (INDEPENDENT_AMBULATORY_CARE_PROVIDER_SITE_OTHER): Payer: BC Managed Care – PPO | Admitting: Nurse Practitioner

## 2023-05-19 ENCOUNTER — Encounter (INDEPENDENT_AMBULATORY_CARE_PROVIDER_SITE_OTHER): Payer: BC Managed Care – PPO

## 2023-05-26 ENCOUNTER — Ambulatory Visit (INDEPENDENT_AMBULATORY_CARE_PROVIDER_SITE_OTHER): Payer: BLUE CROSS/BLUE SHIELD | Admitting: Nurse Practitioner

## 2023-05-26 ENCOUNTER — Encounter: Payer: Self-pay | Admitting: Nurse Practitioner

## 2023-05-26 VITALS — BP 130/88 | HR 99 | Temp 98.5°F | Resp 16 | Ht 69.0 in | Wt 266.6 lb

## 2023-05-26 DIAGNOSIS — N186 End stage renal disease: Secondary | ICD-10-CM

## 2023-05-26 DIAGNOSIS — M5416 Radiculopathy, lumbar region: Secondary | ICD-10-CM

## 2023-05-26 DIAGNOSIS — E1169 Type 2 diabetes mellitus with other specified complication: Secondary | ICD-10-CM

## 2023-05-26 DIAGNOSIS — Z992 Dependence on renal dialysis: Secondary | ICD-10-CM

## 2023-05-26 DIAGNOSIS — Z1212 Encounter for screening for malignant neoplasm of rectum: Secondary | ICD-10-CM

## 2023-05-26 DIAGNOSIS — Z1211 Encounter for screening for malignant neoplasm of colon: Secondary | ICD-10-CM

## 2023-05-26 DIAGNOSIS — I152 Hypertension secondary to endocrine disorders: Secondary | ICD-10-CM

## 2023-05-26 DIAGNOSIS — E66812 Obesity, class 2: Secondary | ICD-10-CM

## 2023-05-26 DIAGNOSIS — M545 Low back pain, unspecified: Secondary | ICD-10-CM

## 2023-05-26 DIAGNOSIS — E1122 Type 2 diabetes mellitus with diabetic chronic kidney disease: Secondary | ICD-10-CM

## 2023-05-26 DIAGNOSIS — Z6839 Body mass index (BMI) 39.0-39.9, adult: Secondary | ICD-10-CM

## 2023-05-26 DIAGNOSIS — E1159 Type 2 diabetes mellitus with other circulatory complications: Secondary | ICD-10-CM | POA: Diagnosis not present

## 2023-05-26 DIAGNOSIS — G4733 Obstructive sleep apnea (adult) (pediatric): Secondary | ICD-10-CM

## 2023-05-26 DIAGNOSIS — I10 Essential (primary) hypertension: Secondary | ICD-10-CM

## 2023-05-26 MED ORDER — LOSARTAN POTASSIUM 50 MG PO TABS: 50.0000 mg | ORAL_TABLET | Freq: Every day | ORAL | 1 refills | Status: DC

## 2023-05-26 MED ORDER — TIRZEPATIDE 5 MG/0.5ML ~~LOC~~ SOAJ
5.0000 mg | SUBCUTANEOUS | 5 refills | Status: DC
Start: 2023-05-26 — End: 2023-07-14

## 2023-05-26 NOTE — Progress Notes (Signed)
[facility] [name] [place], [place] [address]  Internal MEDICINE  Office Visit Note  Patient Name: [name] Walker  [id number]  [id number]  Date of Service: 05/26/2023   Complaints/HPI Pt is here for establishment of PCP. Chief Complaint  Patient presents with   New Patient (Initial Visit)    Est care, sleeping isn't good. Right knee pain, nausea.weight loss    HPI [name] presents for a new patient visit to establish care.  Well-appearing 48 y.o. male with diabetes, ESRD on hemodialysis, hypertension, OSA, right knee pain, lumbar radiculopathy, high cholesterol, obesity, chronic low back pain, history of PE.  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  Exercise: walking on treadmill, weight and push-up and started this about 2 months ago.  Tobacco use: vaping now, smoking about 3-4 cigarettes or black and milds a week. Alcohol use: quit drinking 2 months ago.  Illicit drug use: none  Routine CRC screening: due now  Eye exam: needs eye doctor.  foot exam: will do with annual  Labs: not due now. Most recent A1c is normal at 5.6 in December last year. Slightly elevated PSA at 4.25, seeing Dr. Gerrit Friends at [facility] urology.  New or worsening pain: none    Current Medication: Outpatient Encounter Medications as of 05/26/2023  Medication Sig Note   B Complex-C-Folic Acid (RENA-VITE RX) 1 MG TABS Take 1 tablet by mouth daily.    gabapentin (NEURONTIN) 100 MG capsule Take 100 mg by mouth at bedtime.    rOPINIRole (REQUIP) 0.5 MG tablet Take 0.5 mg by mouth at bedtime.    tirzepatide [facility]) 5 MG/0.5ML Pen Inject 5 mg into the skin once a week.    acetaminophen (TYLENOL) 500 MG tablet Take 500 mg by mouth every 6 (six) hours as needed.    carvedilol (COREG) 25 MG tablet Take 25 mg by mouth 2 (two) times daily with a meal.     cholecalciferol (VITAMIN D3) 25 MCG (1000 UNIT) tablet Take 1,000 Units by mouth daily.    cyanocobalamin (VITAMIN B12) 1000 MCG tablet Take 1,000 mcg by mouth 4 (four) times a week. Takes on non - dialysis days    hydrOXYzine (ATARAX) 25 MG tablet Take 25 mg by mouth daily.    losartan (COZAAR) 50 MG tablet Take 1 tablet (50 mg total) by mouth daily.    NON FORMULARY CPAP everynight 12/30/2017: PRN   sertraline (ZOLOFT) 50 MG tablet Take 50 mg by mouth daily.    sevelamer carbonate (RENVELA) 800 MG tablet Take 1,600 mg by mouth 3 (three) times daily.    torsemide (DEMADEX) 20 MG tablet Take 20 mg by mouth. Tues, Thursday, Saturday,Sunday    [DISCONTINUED] gabapentin (NEURONTIN) 600 MG tablet TAKE 1 TABLET BY MOUTH THREE TIMES A DAY    [DISCONTINUED] hydrALAZINE (APRESOLINE) 25 MG tablet Take 25 mg by mouth 3 (three) times a week. Taking on dialysis days    [DISCONTINUED] hydrochlorothiazide (HYDRODIURIL) 25 MG tablet Take 1 tablet (25 mg total) by mouth daily. (Patient not taking: Reported on 09/18/2022)    [DISCONTINUED] HYDROcodone-acetaminophen (NORCO/VICODIN) 5-325 MG tablet Take 1/2 tablet every 12 hours for pain. (Patient not taking: Reported on 11/05/2020)    [DISCONTINUED] HYDROcodone-acetaminophen (NORCO/VICODIN) 5-325 MG tablet Take 2 tablets by mouth every 6 (six) hours as needed for moderate pain.    [DISCONTINUED] losartan (COZAAR) 50  MG tablet Take 1 tablet (50 mg total) by mouth daily.    [DISCONTINUED] metoprolol succinate (TOPROL-XL) 50 MG 24 hr tablet TAKE ONE TABLET DAILY. TAKE WITH OR IMMEDIATELY FOLLOWING A MEAL. (Patient not taking: Reported on 09/18/2022)    [DISCONTINUED] sildenafil (VIAGRA) 50 MG tablet Take 1 tablet (50 mg total) by mouth daily as needed for erectile dysfunction.    [DISCONTINUED] tadalafil (CIALIS) 10 MG tablet Take 1 tablet (10 mg total) by mouth daily as needed for erectile dysfunction.    [DISCONTINUED] triamcinolone cream (KENALOG) 0.1 % APPLY TO AFFECTED  AREA TWICE A DAY (Patient not taking: Reported on 10/02/2022)    [DISCONTINUED] TRULICITY 1.5 MG/0.5ML SOPN INJECT 1.5 MG INTO THE SKIN ONCE A WEEK.    [DISCONTINUED] vardenafil (LEVITRA) 10 MG tablet Take 1 tablet (10 mg total) by mouth daily as needed for erectile dysfunction. (Patient not taking: Reported on 11/05/2020)    No facility-administered encounter medications on file as of 05/26/2023.    Surgical History: Past Surgical History:  Procedure Laterality Date   AV FISTULA PLACEMENT Left 10/02/2022   Procedure: ARTERIOVENOUS (AV) FISTULA CREATION (RADIALCEPHALIC);  Surgeon: Annice Needy, MD;  Location: [facility] ORS;  Service: Vascular;  Laterality: Left;   DIALYSIS/PERMA CATHETER INSERTION N/A 06/25/2022   Procedure: DIALYSIS/PERMA CATHETER INSERTION;  Surgeon: Annice Needy, MD;  Location: [facility] INVASIVE CV LAB;  Service: Cardiovascular;  Laterality: N/A;   TONSILLECTOMY  1985   UMBILICAL HERNIA REPAIR      Medical History: Past Medical History:  Diagnosis Date   Anxiety    Chronic kidney disease    ESRD   Diabetes mellitus without complication (HCC)    hgb A!C 6.0   Family history of adverse reaction to anesthesia    mother states that she was able to feel everything during a procedure   Hypertension    Neuromuscular disorder (HCC)    Pulmonary embolism (HCC)    Sleep apnea     Family History: Family History  Problem Relation Age of Onset   Diabetes Mother    Cancer Mother        lung    Social History   Socioeconomic History   Marital status: Married    Spouse name: ,Alvy Beal   Number of children: 3   Years of education: Not on file   Highest education level: Not on file  Occupational History   Occupation: Paediatric nurse  Tobacco Use   Smoking status: Some Days    Types: Cigarettes, Cigars, E-cigarettes   Smokeless tobacco: Never  Vaping Use   Vaping status: Some Days   Substances: Nicotine, Flavoring  Substance and Sexual Activity   Alcohol use: Not Currently     Comment: 1 glass of wine daily   Drug use: Never   Sexual activity: Yes    Birth control/protection: None  Other Topics Concern   Not on file  Social History Narrative   Not on file   Social Drivers of Health   Financial Resource Strain: Not on file  Food Insecurity: Not on file  Transportation Needs: Not on file  Physical Activity: Insufficiently Active (09/14/2018)   Exercise Vital Sign    Days of Exercise per Week: 1 day    Minutes of Exercise per Session: 10 min  Stress: No Stress Concern Present (09/14/2018)   [employer] of Occupational Health - Occupational Stress Questionnaire    Feeling of Stress : Only a little  Social Connections: Moderately Integrated (09/14/2018)   Social Connection and  Isolation Panel [NHANES]    Frequency of Communication with Friends and Family: Twice a week    Frequency of Social Gatherings with Friends and Family: Twice a week    Attends Religious Services: 1 to 4 times per year    Active Member of [employer] or Organizations: No    Attends Banker Meetings: Never    Marital Status: Married  Catering manager Violence: Not At Risk (09/14/2018)   Humiliation, Afraid, Rape, and Kick questionnaire    Fear of Current or Ex-Partner: No    Emotionally Abused: No    Physically Abused: No    Sexually Abused: No     Review of Systems  Constitutional:  Positive for fatigue. Negative for chills and unexpected weight change.  HENT:  Positive for postnasal drip. Negative for congestion, rhinorrhea, sneezing and sore throat.   Eyes:  Negative for redness.  Respiratory:  Negative for cough, chest tightness and shortness of breath.   Cardiovascular:  Negative for chest pain and palpitations.  Gastrointestinal:  Negative for abdominal pain, constipation, diarrhea, nausea and vomiting.  Genitourinary:  Negative for dysuria and frequency.  Musculoskeletal:  Negative for arthralgias, back pain, joint swelling and neck pain.  Skin:  Negative for  rash.  Neurological: Negative.  Negative for tremors and numbness.  Hematological:  Negative for adenopathy. Does not bruise/bleed easily.  Psychiatric/Behavioral:  Positive for sleep disturbance. Negative for behavioral problems (Depression), self-injury and suicidal ideas. The patient is not nervous/anxious.     Vital Signs: BP 130/88   Pulse 99   Temp 98.5 F (36.9 C)   Resp 16   Ht 5\' 9"  (1.753 m)   Wt 266 lb 9.6 oz (120.9 kg)   SpO2 99%   BMI 39.37 kg/m    Physical Exam Vitals reviewed.  Constitutional:      General: He is not in acute distress.    Appearance: Normal appearance. He is obese. He is not ill-appearing.  HENT:     Head: Normocephalic and atraumatic.  Eyes:     Pupils: Pupils are equal, round, and reactive to light.  Cardiovascular:     Rate and Rhythm: Normal rate and regular rhythm.  Pulmonary:     Effort: Pulmonary effort is normal. No respiratory distress.  Neurological:     Mental Status: He is alert and oriented to person, place, and time.  Psychiatric:        Mood and Affect: Mood normal.        Behavior: Behavior normal.       Assessment/Plan: 1. Type 2 diabetes mellitus with chronic kidney disease on chronic dialysis, without long-term current use of insulin (HCC) (Primary) Referred to ophthalmology for diabetic eye exam. Start mounjaro, sample given and prescription sent to pharmacy.  - tirzepatide [facility]) 5 MG/0.5ML Pen; Inject 5 mg into the skin once a week.  Dispense: 2 mL; Refill: 5 - Ambulatory referral to Ophthalmology  2. ESRD (end stage renal disease) on dialysis [facility]) Seeing nephrology, on hemodialysis   3. Hypertension associated with diabetes (HCC) Stable, continue losartan as prescribed.  - losartan (COZAAR) 50 MG tablet; Take 1 tablet (50 mg total) by mouth daily.  Dispense: 90 tablet; Refill: 1  4. OSA (obstructive sleep apnea) Weight loss will help improve sleep apnea. Currently using CPAP machine as  instructed.  5. Class 2 severe obesity due to excess calories with serious comorbidity and body mass index (BMI) of 39.0 to 39.9 in adult [facility]) Will be starting mounjaro which  may help with weight loss.   6. Screening for colorectal cancer Referred to GI for routine colonoscopy - Ambulatory referral to Gastroenterology     General Counseling: [name] verbalizes understanding of the findings of todays visit and agrees with plan of treatment. I have discussed any further diagnostic evaluation that may be needed or ordered today. We also reviewed his medications today. he has been encouraged to call the office with any questions or concerns that should arise related to todays visit.    Orders Placed This Encounter  Procedures   Ambulatory referral to Gastroenterology   Ambulatory referral to Ophthalmology    Meds ordered this encounter  Medications   tirzepatide [facility]) 5 MG/0.5ML Pen    Sig: Inject 5 mg into the skin once a week.    Dispense:  2 mL    Refill:  5    Dx code E11.65, this is continuation of medication with an increase in dose.   losartan (COZAAR) 50 MG tablet    Sig: Take 1 tablet (50 mg total) by mouth daily.    Dispense:  90 tablet    Refill:  1    Discontinue previous orders, I am taking over prescription for patient, please keep on file for future refills.    Return in about 1 month (around 06/26/2023) for F/U, Shigeo Baugh PCP increased mounjaro dose. .  Time spent:30 Minutes Time spent with patient included reviewing progress notes, labs, imaging studies, and discussing plan for follow up.   [place] Controlled Substance Database was reviewed by me for overdose risk score (ORS)   This patient was seen by Sallyanne Kuster, FNP-C in collaboration with Dr. Beverely Risen as a part of collaborative care agreement.   Eron Goble R. Tedd Sias, MSN, FNP-C Internal Medicine

## 2023-05-27 ENCOUNTER — Telehealth: Payer: Self-pay | Admitting: Nurse Practitioner

## 2023-05-27 NOTE — Telephone Encounter (Signed)
Awaiting 05/26/23 office notes for Ophthalmology  & GI referral-Toni

## 2023-06-03 ENCOUNTER — Encounter: Payer: Self-pay | Admitting: Nurse Practitioner

## 2023-06-04 NOTE — Telephone Encounter (Signed)
 Spoke with pt that he need to discuss with his dialysis dr for lidocaine 

## 2023-06-09 ENCOUNTER — Telehealth: Payer: Self-pay

## 2023-06-10 MED ORDER — LIDOCAINE 5 % EX CREA: TOPICAL_CREAM | CUTANEOUS | 5 refills | Status: DC

## 2023-06-10 NOTE — Telephone Encounter (Signed)
Pt advised we sent med

## 2023-06-10 NOTE — Telephone Encounter (Signed)
Pt was notified.

## 2023-06-15 ENCOUNTER — Encounter: Payer: Self-pay | Admitting: Nurse Practitioner

## 2023-06-15 ENCOUNTER — Telehealth: Payer: Self-pay | Admitting: Nurse Practitioner

## 2023-06-15 NOTE — Telephone Encounter (Signed)
GI referral faxed to [facility] due to insurance; [phone]. Lvm notifying patient. Gave pt telephone [phone]  Ophthalmology referral faxed to [facility] due to insurance; [phone]. Lvm notifying patient. Gave pt telephone [phone]

## 2023-06-23 ENCOUNTER — Telehealth: Payer: Self-pay | Admitting: Nurse Practitioner

## 2023-06-23 ENCOUNTER — Ambulatory Visit (INDEPENDENT_AMBULATORY_CARE_PROVIDER_SITE_OTHER): Payer: BLUE CROSS/BLUE SHIELD | Admitting: Nurse Practitioner

## 2023-06-23 ENCOUNTER — Encounter: Payer: Self-pay | Admitting: Nurse Practitioner

## 2023-06-23 VITALS — BP 110/76 | HR 82 | Temp 97.5°F | Resp 16 | Ht 69.0 in | Wt 256.2 lb

## 2023-06-23 DIAGNOSIS — G4709 Other insomnia: Secondary | ICD-10-CM | POA: Diagnosis not present

## 2023-06-23 DIAGNOSIS — M1711 Unilateral primary osteoarthritis, right knee: Secondary | ICD-10-CM

## 2023-06-23 DIAGNOSIS — N186 End stage renal disease: Secondary | ICD-10-CM

## 2023-06-23 DIAGNOSIS — E66812 Obesity, class 2: Secondary | ICD-10-CM

## 2023-06-23 DIAGNOSIS — Z6839 Body mass index (BMI) 39.0-39.9, adult: Secondary | ICD-10-CM

## 2023-06-23 DIAGNOSIS — G4733 Obstructive sleep apnea (adult) (pediatric): Secondary | ICD-10-CM | POA: Diagnosis not present

## 2023-06-23 DIAGNOSIS — Z992 Dependence on renal dialysis: Secondary | ICD-10-CM

## 2023-06-23 MED ORDER — BELSOMRA 15 MG PO TABS
15.0000 mg | ORAL_TABLET | Freq: Every evening | ORAL | 2 refills | Status: DC | PRN
Start: 2023-06-23 — End: 2023-07-14

## 2023-06-23 MED ORDER — CELECOXIB 100 MG PO CAPS
100.0000 mg | ORAL_CAPSULE | Freq: Two times a day (BID) | ORAL | 3 refills | Status: DC
Start: 2023-06-23 — End: 2023-07-14

## 2023-06-23 NOTE — Telephone Encounter (Signed)
 Faxed GI referral to [facility] again; [phone]

## 2023-06-23 NOTE — Progress Notes (Signed)
 [facility] [address] [place], [place] [address]  Internal MEDICINE  Office Visit Note  Patient Name: [name] Walker  [id number]  [id number]  Date of Service: 06/23/2023  Chief Complaint  Patient presents with   Diabetes   Hypertension   Follow-up    HPI [name] presents for a follow-up visit for trouble sleeping, sleep apnea, weight loss, dizzy spells.  Trouble sleeping -- issues with falling asleep. Body feels restless. Wakes up around 4 pm, likes to try to go to sleep around 9pm -11pm. Has tried benadryl to help sleep but makes him groggy in the morning. Tried melatonin but it did not help him. Has had issues with restless legs which has improved with gabapentin and ropinirole. Has been taking his sertraline at night which can cause insomnia  Sleep apnea -- has not used CPAP machine in a long time. Reports he does not feel like he still has sleep apnea anymore.  Dizzy spells. -- random and sometimes happens with postural changes. May be caused by poor sleep.  Weight loss -- has lost about 10 lbs since last office visit  Right knee arthritic pain -- wants to try something to help with that. Was taking BC powders and taking ibuprofen and tylenol.    Current Medication: Outpatient Encounter Medications as of 06/23/2023  Medication Sig Note   acetaminophen (TYLENOL) 500 MG tablet Take 500 mg by mouth every 6 (six) hours as needed.    B Complex-C-Folic Acid (RENA-VITE RX) 1 MG TABS Take 1 tablet by mouth daily.    celecoxib (CELEBREX) 100 MG capsule Take 1 capsule (100 mg total) by mouth 2 (two) times daily.    cholecalciferol (VITAMIN D3) 25 MCG (1000 UNIT) tablet Take 1,000 Units by mouth daily.    cyanocobalamin (VITAMIN B12) 1000 MCG tablet Take 1,000 mcg by mouth 4 (four) times a week. Takes on non - dialysis days    gabapentin (NEURONTIN) 100 MG capsule Take 100 mg by mouth at bedtime.    hydrOXYzine (ATARAX) 25 MG tablet Take 25 mg by mouth daily.    Lidocaine 5 %  CREA Apply topical cream to dialysis access site 30 min to 1 hour before starting dialysis on dialysis days 3 times weekly.    NON FORMULARY CPAP everynight 12/30/2017: PRN   rOPINIRole (REQUIP) 0.5 MG tablet Take 0.5 mg by mouth at bedtime.    sertraline (ZOLOFT) 50 MG tablet Take 50 mg by mouth daily.    sevelamer carbonate (RENVELA) 800 MG tablet Take 1,600 mg by mouth 3 (three) times daily.    Suvorexant (BELSOMRA) 15 MG TABS Take 1 tablet (15 mg total) by mouth at bedtime as needed.    tirzepatide [facility]) 5 MG/0.5ML Pen Inject 5 mg into the skin once a week.    torsemide (DEMADEX) 20 MG tablet Take 20 mg by mouth. Tues, Thursday, Saturday,Sunday    [DISCONTINUED] losartan (COZAAR) 50 MG tablet Take 1 tablet (50 mg total) by mouth daily.    carvedilol (COREG) 25 MG tablet Take 25 mg by mouth 2 (two) times daily with a meal.    [DISCONTINUED] sildenafil (VIAGRA) 50 MG tablet Take 1 tablet (50 mg total) by mouth daily as needed for erectile dysfunction.    [DISCONTINUED] tadalafil (CIALIS) 10 MG tablet Take 1 tablet (10 mg total) by mouth daily as needed for erectile dysfunction.    No facility-administered encounter medications on file as of 06/23/2023.    Surgical History: Past Surgical History:  Procedure  Laterality Date   AV FISTULA PLACEMENT Left 10/02/2022   Procedure: ARTERIOVENOUS (AV) FISTULA CREATION (RADIALCEPHALIC);  Surgeon: Annice Needy, MD;  Location: [facility] ORS;  Service: Vascular;  Laterality: Left;   DIALYSIS/PERMA CATHETER INSERTION N/A 06/25/2022   Procedure: DIALYSIS/PERMA CATHETER INSERTION;  Surgeon: Annice Needy, MD;  Location: [facility] INVASIVE CV LAB;  Service: Cardiovascular;  Laterality: N/A;   TONSILLECTOMY  1985   UMBILICAL HERNIA REPAIR      Medical History: Past Medical History:  Diagnosis Date   Anxiety    Chronic kidney disease    ESRD   Diabetes mellitus without complication (HCC)    hgb A!C 6.0   Family history of adverse reaction to anesthesia     mother states that she was able to feel everything during a procedure   Hypertension    Neuromuscular disorder (HCC)    Pulmonary embolism (HCC)    Sleep apnea     Family History: Family History  Problem Relation Age of Onset   Diabetes Mother    Cancer Mother        lung    Social History   Socioeconomic History   Marital status: Married    Spouse name: ,Alvy Beal   Number of children: 3   Years of education: Not on file   Highest education level: Not on file  Occupational History   Occupation: Paediatric nurse  Tobacco Use   Smoking status: Some Days    Types: Cigarettes, Cigars, E-cigarettes   Smokeless tobacco: Never  Vaping Use   Vaping status: Some Days   Substances: Nicotine, Flavoring  Substance and Sexual Activity   Alcohol use: Not Currently    Comment: 1 glass of wine daily   Drug use: Never   Sexual activity: Yes    Birth control/protection: None  Other Topics Concern   Not on file  Social History Narrative   Not on file   Social Drivers of Health   Financial Resource Strain: Not on file  Food Insecurity: Not on file  Transportation Needs: Not on file  Physical Activity: Insufficiently Active (09/14/2018)   Exercise Vital Sign    Days of Exercise per Week: 1 day    Minutes of Exercise per Session: 10 min  Stress: No Stress Concern Present (09/14/2018)   [employer] of Occupational Health - Occupational Stress Questionnaire    Feeling of Stress : Only a little  Social Connections: Moderately Integrated (09/14/2018)   Social Connection and Isolation Panel [NHANES]    Frequency of Communication with Friends and Family: Twice a week    Frequency of Social Gatherings with Friends and Family: Twice a week    Attends Religious Services: 1 to 4 times per year    Active Member of [employer] or Organizations: No    Attends Banker Meetings: Never    Marital Status: Married  Catering manager Violence: Not At Risk (09/14/2018)   Humiliation, Afraid, Rape,  and Kick questionnaire    Fear of Current or Ex-Partner: No    Emotionally Abused: No    Physically Abused: No    Sexually Abused: No      Review of Systems  Constitutional:  Positive for fatigue. Negative for chills and unexpected weight change.  HENT:  Positive for postnasal drip. Negative for congestion, rhinorrhea, sneezing and sore throat.   Eyes:  Negative for redness.  Respiratory: Negative.  Negative for cough, chest tightness, shortness of breath and wheezing.   Cardiovascular: Negative.  Negative for chest pain  and palpitations.  Gastrointestinal:  Negative for abdominal pain, constipation, diarrhea, nausea and vomiting.  Genitourinary:  Negative for dysuria and frequency.  Musculoskeletal:  Negative for arthralgias, back pain, joint swelling and neck pain.  Skin:  Negative for rash.  Neurological: Negative.  Negative for tremors and numbness.  Hematological:  Negative for adenopathy. Does not bruise/bleed easily.  Psychiatric/Behavioral:  Positive for sleep disturbance. Negative for behavioral problems (Depression), self-injury and suicidal ideas. The patient is nervous/anxious.     Vital Signs: BP 110/76   Pulse 82   Temp (!) 97.5 F (36.4 C)   Resp 16   Ht 5\' 9"  (1.753 m)   Wt 256 lb 3.2 oz (116.2 kg)   SpO2 96%   BMI 37.83 kg/m    Physical Exam Vitals reviewed.  Constitutional:      General: He is not in acute distress.    Appearance: Normal appearance. He is obese. He is not ill-appearing.  HENT:     Head: Normocephalic and atraumatic.  Eyes:     Pupils: Pupils are equal, round, and reactive to light.  Cardiovascular:     Rate and Rhythm: Normal rate and regular rhythm.  Pulmonary:     Effort: Pulmonary effort is normal. No respiratory distress.  Neurological:     Mental Status: He is alert and oriented to person, place, and time.  Psychiatric:        Mood and Affect: Mood normal.        Behavior: Behavior normal.        Assessment/Plan: 1.  Arthritis of right knee (Primary) Try celebrex as prescribed. Follow up in 1 month - celecoxib (CELEBREX) 100 MG capsule; Take 1 capsule (100 mg total) by mouth 2 (two) times daily.  Dispense: 60 capsule; Refill: 3  2. Other insomnia Wil try belsomra for sleep, samples given and prescription sent to pharmacy.  - Suvorexant (BELSOMRA) 15 MG TABS; Take 1 tablet (15 mg total) by mouth at bedtime as needed.  Dispense: 30 tablet; Refill: 2  3. OSA (obstructive sleep apnea) Not using CPAP, states that he is not having symptoms of sleep apnea anymore.   4. ESRD (end stage renal disease) on dialysis [facility]) Getting hemodialysis 3 times weekly.   5. Class 2 severe obesity due to excess calories with serious comorbidity and body mass index (BMI) of 39.0 to 39.9 in adult (HCC) Losing weight with mounjaro. Has lost 10 more lbs.    General Counseling: [name] verbalizes understanding of the findings of todays visit and agrees with plan of treatment. I have discussed any further diagnostic evaluation that may be needed or ordered today. We also reviewed his medications today. he has been encouraged to call the office with any questions or concerns that should arise related to todays visit.    No orders of the defined types were placed in this encounter.   Meds ordered this encounter  Medications   Suvorexant (BELSOMRA) 15 MG TABS    Sig: Take 1 tablet (15 mg total) by mouth at bedtime as needed.    Dispense:  30 tablet    Refill:  2    Fill new script, sent prior auth request to clinic asap   celecoxib (CELEBREX) 100 MG capsule    Sig: Take 1 capsule (100 mg total) by mouth 2 (two) times daily.    Dispense:  60 capsule    Refill:  3    Fill new script today    Return in about 1 month (  around 07/21/2023) for F/U , eval new med, Kenidi Elenbaas PCP belsomra and celebrex .   Total time spent:30 Minutes Time spent includes review of chart, medications, test results, and follow up plan with the patient.    [place] Controlled Substance Database was reviewed by me.  This patient was seen by Sallyanne Kuster, FNP-C in collaboration with Dr. Beverely Risen as a part of collaborative care agreement.   Ashwika Freels R. Tedd Sias, MSN, FNP-C Internal medicine

## 2023-06-23 NOTE — Patient Instructions (Signed)
 Switch sertraline to taking it in the morning.

## 2023-07-14 ENCOUNTER — Ambulatory Visit: Payer: BLUE CROSS/BLUE SHIELD | Admitting: Nurse Practitioner

## 2023-07-14 ENCOUNTER — Encounter: Payer: Self-pay | Admitting: Nurse Practitioner

## 2023-07-14 VITALS — BP 114/80 | HR 86 | Temp 97.9°F | Resp 16 | Ht 69.0 in | Wt 258.0 lb

## 2023-07-14 DIAGNOSIS — G4733 Obstructive sleep apnea (adult) (pediatric): Secondary | ICD-10-CM

## 2023-07-14 DIAGNOSIS — G2581 Restless legs syndrome: Secondary | ICD-10-CM

## 2023-07-14 DIAGNOSIS — M1711 Unilateral primary osteoarthritis, right knee: Secondary | ICD-10-CM

## 2023-07-14 DIAGNOSIS — N186 End stage renal disease: Secondary | ICD-10-CM | POA: Diagnosis not present

## 2023-07-14 DIAGNOSIS — I152 Hypertension secondary to endocrine disorders: Secondary | ICD-10-CM

## 2023-07-14 DIAGNOSIS — E66812 Obesity, class 2: Secondary | ICD-10-CM

## 2023-07-14 DIAGNOSIS — E1122 Type 2 diabetes mellitus with diabetic chronic kidney disease: Secondary | ICD-10-CM

## 2023-07-14 DIAGNOSIS — E1159 Type 2 diabetes mellitus with other circulatory complications: Secondary | ICD-10-CM

## 2023-07-14 DIAGNOSIS — Z992 Dependence on renal dialysis: Secondary | ICD-10-CM

## 2023-07-14 DIAGNOSIS — G4709 Other insomnia: Secondary | ICD-10-CM

## 2023-07-14 DIAGNOSIS — Z6839 Body mass index (BMI) 39.0-39.9, adult: Secondary | ICD-10-CM

## 2023-07-14 MED ORDER — HYDROXYZINE HCL 25 MG PO TABS: 25.0000 mg | ORAL_TABLET | Freq: Two times a day (BID) | ORAL | 5 refills | Status: DC | PRN

## 2023-07-14 MED ORDER — CELECOXIB 100 MG PO CAPS: 100.0000 mg | ORAL_CAPSULE | Freq: Two times a day (BID) | ORAL | 3 refills | Status: DC

## 2023-07-14 MED ORDER — ROPINIROLE HCL 1 MG PO TABS
1.0000 mg | ORAL_TABLET | Freq: Three times a day (TID) | ORAL | 5 refills | Status: DC
Start: 2023-07-14 — End: 2024-01-19

## 2023-07-14 MED ORDER — MOUNJARO 7.5 MG/0.5ML ~~LOC~~ SOAJ: 7.5000 mg | SUBCUTANEOUS | 5 refills | Status: DC

## 2023-07-14 NOTE — Progress Notes (Signed)
 [facility] [address] [place], [place] [address]  Internal MEDICINE  Office Visit Note  Patient Name: [name] Walker  [id number]  [id number]  Date of Service: 07/14/2023  Chief Complaint  Patient presents with   Diabetes   Hypertension   Follow-up    HPI [name] presents for a follow-up visit for trouble sleeping, back pain, trype 2 diabetes and hypertension Trouble sleeping -- belsomra  did not keep patient asleep but has tried hydroxyzine  which helps with sleep for him.  Back pain -- medication is helping Type 2 diabetes -- A1c is well controlled with mounjaro, initially A1c was 6.5 in 2021. Having trouble getting mounjaro for the past 2 weeks.  Not taking carvedilol and losartan  dose was decreased to 50 mg daily.     Current Medication: Outpatient Encounter Medications as of 07/14/2023  Medication Sig Note   acetaminophen  (TYLENOL ) 500 MG tablet Take 500 mg by mouth every 6 (six) hours as needed.    B Complex-C-Folic Acid (RENA-VITE RX) 1 MG TABS Take 1 tablet by mouth daily.    cholecalciferol (VITAMIN D3) 25 MCG (1000 UNIT) tablet Take 1,000 Units by mouth daily.    cyanocobalamin (VITAMIN B12) 1000 MCG tablet Take 1,000 mcg by mouth 4 (four) times a week. Takes on non - dialysis days    gabapentin  (NEURONTIN ) 100 MG capsule Take 100 mg by mouth at bedtime.    Lidocaine  5 % CREA Apply topical cream to dialysis access site 30 min to 1 hour before starting dialysis on dialysis days 3 times weekly.    losartan  (COZAAR ) 50 MG tablet Take 50 mg by mouth daily.    NON FORMULARY CPAP everynight 12/30/2017: PRN   rOPINIRole  (REQUIP ) 1 MG tablet Take 1 tablet (1 mg total) by mouth 3 (three) times daily.    sertraline (ZOLOFT) 50 MG tablet Take 50 mg by mouth daily.    sevelamer carbonate (RENVELA) 800 MG tablet Take 1,600 mg by mouth 3 (three) times daily.    tirzepatide (MOUNJARO) 7.5 MG/0.5ML Pen Inject 7.5 mg into the skin once a week.    torsemide (DEMADEX) 20 MG tablet  Take 20 mg by mouth. Tues, Thursday, Saturday,Sunday    [DISCONTINUED] celecoxib  (CELEBREX ) 100 MG capsule Take 1 capsule (100 mg total) by mouth 2 (two) times daily.    [DISCONTINUED] hydrOXYzine  (ATARAX ) 25 MG tablet Take 25 mg by mouth daily.    [DISCONTINUED] rOPINIRole  (REQUIP ) 0.5 MG tablet Take 0.5 mg by mouth at bedtime.    [DISCONTINUED] Suvorexant  (BELSOMRA ) 15 MG TABS Take 1 tablet (15 mg total) by mouth at bedtime as needed.    [DISCONTINUED] tirzepatide (MOUNJARO) 5 MG/0.5ML Pen Inject 5 mg into the skin once a week.    carvedilol (COREG) 25 MG tablet Take 25 mg by mouth 2 (two) times daily with a meal. 07/14/2023: Stopped taking this medication due to BP being too low.    celecoxib  (CELEBREX ) 100 MG capsule Take 1 capsule (100 mg total) by mouth 2 (two) times daily.    hydrOXYzine  (ATARAX ) 25 MG tablet Take 1 tablet (25 mg total) by mouth 2 (two) times daily as needed (anxiety/sleep).    [DISCONTINUED] sildenafil  (VIAGRA ) 50 MG tablet Take 1 tablet (50 mg total) by mouth daily as needed for erectile dysfunction.    [DISCONTINUED] tadalafil (CIALIS) 10 MG tablet Take 1 tablet (10 mg total) by mouth daily as needed for erectile dysfunction.    No facility-administered encounter medications on file as of 07/14/2023.  Surgical History: Past Surgical History:  Procedure Laterality Date   AV FISTULA PLACEMENT Left 10/02/2022   Procedure: ARTERIOVENOUS (AV) FISTULA CREATION (RADIALCEPHALIC);  Surgeon: Celso College, MD;  Location: [facility] ORS;  Service: Vascular;  Laterality: Left;   DIALYSIS/PERMA CATHETER INSERTION N/A 06/25/2022   Procedure: DIALYSIS/PERMA CATHETER INSERTION;  Surgeon: Celso College, MD;  Location: [facility] INVASIVE CV LAB;  Service: Cardiovascular;  Laterality: N/A;   TONSILLECTOMY  1985   UMBILICAL HERNIA REPAIR      Medical History: Past Medical History:  Diagnosis Date   Anxiety    Chronic kidney disease    ESRD   Diabetes mellitus without complication (HCC)    hgb  A!C 6.0   Family history of adverse reaction to anesthesia    mother states that she was able to feel everything during a procedure   Hypertension    Neuromuscular disorder (HCC)    Pulmonary embolism (HCC)    Sleep apnea     Family History: Family History  Problem Relation Age of Onset   Diabetes Mother    Cancer Mother        lung    Social History   Socioeconomic History   Marital status: Married    Spouse name: ,Nettie Barb   Number of children: 3   Years of education: Not on file   Highest education level: Not on file  Occupational History   Occupation: Paediatric nurse  Tobacco Use   Smoking status: Some Days    Types: Cigarettes, Cigars, E-cigarettes   Smokeless tobacco: Never  Vaping Use   Vaping status: Some Days   Substances: Nicotine , Flavoring  Substance and Sexual Activity   Alcohol use: Not Currently    Comment: 1 glass of wine daily   Drug use: Never   Sexual activity: Yes    Birth control/protection: None  Other Topics Concern   Not on file  Social History Narrative   Not on file   Social Drivers of Health   Financial Resource Strain: Not on file  Food Insecurity: Not on file  Transportation Needs: Not on file  Physical Activity: Insufficiently Active (09/14/2018)   Exercise Vital Sign    Days of Exercise per Week: 1 day    Minutes of Exercise per Session: 10 min  Stress: No Stress Concern Present (09/14/2018)   [employer] of Occupational Health - Occupational Stress Questionnaire    Feeling of Stress : Only a little  Social Connections: Moderately Integrated (09/14/2018)   Social Connection and Isolation Panel [NHANES]    Frequency of Communication with Friends and Family: Twice a week    Frequency of Social Gatherings with Friends and Family: Twice a week    Attends Religious Services: 1 to 4 times per year    Active Member of [employer] or Organizations: No    Attends Banker Meetings: Never    Marital Status: Married  Catering manager  Violence: Not At Risk (09/14/2018)   Humiliation, Afraid, Rape, and Kick questionnaire    Fear of Current or Ex-Partner: No    Emotionally Abused: No    Physically Abused: No    Sexually Abused: No      Review of Systems  Constitutional:  Positive for fatigue. Negative for chills and unexpected weight change.  HENT:  Positive for postnasal drip. Negative for congestion, rhinorrhea, sneezing and sore throat.   Eyes:  Negative for redness.  Respiratory: Negative.  Negative for cough, chest tightness, shortness of breath and wheezing.  Cardiovascular: Negative.  Negative for chest pain and palpitations.  Gastrointestinal: Negative.  Negative for abdominal pain, constipation, diarrhea, nausea and vomiting.  Genitourinary:  Negative for dysuria and frequency.  Musculoskeletal: Negative.  Negative for arthralgias, back pain, joint swelling and neck pain.  Skin:  Negative for rash.  Neurological: Negative.  Negative for tremors and numbness.  Hematological:  Negative for adenopathy. Does not bruise/bleed easily.  Psychiatric/Behavioral:  Positive for sleep disturbance. Negative for behavioral problems (Depression), self-injury and suicidal ideas. The patient is nervous/anxious.     Vital Signs: BP 114/80   Pulse 86   Temp 97.9 F (36.6 C)   Resp 16   Ht 5\' 9"  (1.753 m)   Wt 258 lb (117 kg)   SpO2 98%   BMI 38.10 kg/m    Physical Exam Vitals reviewed.  Constitutional:      General: He is not in acute distress.    Appearance: Normal appearance. He is obese. He is not ill-appearing.  HENT:     Head: Normocephalic and atraumatic.  Eyes:     Pupils: Pupils are equal, round, and reactive to light.  Cardiovascular:     Rate and Rhythm: Normal rate and regular rhythm.  Pulmonary:     Effort: Pulmonary effort is normal. No respiratory distress.  Neurological:     Mental Status: He is alert and oriented to person, place, and time.  Psychiatric:        Mood and Affect: Mood  normal.        Behavior: Behavior normal.        Assessment/Plan: 1. Type 2 diabetes mellitus with chronic kidney disease on chronic dialysis, without long-term current use of insulin  (HCC) (Primary) Mounjaro dose increased to 7.5 mg weekly.  - tirzepatide (MOUNJARO) 7.5 MG/0.5ML Pen; Inject 7.5 mg into the skin once a week.  Dispense: 2 mL; Refill: 5  2. Hypertension associated with diabetes (HCC) Continue losartan  as prescribed.  - losartan  (COZAAR ) 50 MG tablet; Take 50 mg by mouth daily.  3. ESRD (end stage renal disease) on dialysis (HCC) Continue to follow up with nephrology and continue hemodialysis as instructed.   4. Restless legs Ropinirole  dose increased, continue as prescribed.  - rOPINIRole  (REQUIP ) 1 MG tablet; Take 1 tablet (1 mg total) by mouth 3 (three) times daily.  Dispense: 30 tablet; Refill: 5  5. Arthritis of right knee Continue celebrex  as prescribed.  - celecoxib  (CELEBREX ) 100 MG capsule; Take 1 capsule (100 mg total) by mouth 2 (two) times daily.  Dispense: 60 capsule; Refill: 3  6. OSA (obstructive sleep apnea) Continue cpap use as instructed   7. Other insomnia Hydroxyzine  prescribed for anxiety and for help with sleep  - hydrOXYzine  (ATARAX ) 25 MG tablet; Take 1 tablet (25 mg total) by mouth 2 (two) times daily as needed (anxiety/sleep).  Dispense: 60 tablet; Refill: 5  8. Class 2 severe obesity due to excess calories with serious comorbidity and body mass index (BMI) of 39.0 to 39.9 in adult (HCC) Mounjaro dose for diabetes increased which will help with weight loss as well.    General Counseling: [name] verbalizes understanding of the findings of todays visit and agrees with plan of treatment. I have discussed any further diagnostic evaluation that may be needed or ordered today. We also reviewed his medications today. he has been encouraged to call the office with any questions or concerns that should arise related to todays visit.    No orders  of the defined types were  placed in this encounter.   Meds ordered this encounter  Medications   hydrOXYzine  (ATARAX ) 25 MG tablet    Sig: Take 1 tablet (25 mg total) by mouth 2 (two) times daily as needed (anxiety/sleep).    Dispense:  60 tablet    Refill:  5    Fill new script today, note change in frequency   tirzepatide (MOUNJARO) 7.5 MG/0.5ML Pen    Sig: Inject 7.5 mg into the skin once a week.    Dispense:  2 mL    Refill:  5    Dx code E11.65, initial A1c was 6.5 at diagnosis but is well controlled with mounjaro.   celecoxib  (CELEBREX ) 100 MG capsule    Sig: Take 1 capsule (100 mg total) by mouth 2 (two) times daily.    Dispense:  60 capsule    Refill:  3    Fill new script today   rOPINIRole  (REQUIP ) 1 MG tablet    Sig: Take 1 tablet (1 mg total) by mouth 3 (three) times daily.    Dispense:  30 tablet    Refill:  5    Fill script today, note increased dose.    Return in about 3 months (around 10/14/2023) for F/U, Recheck A1C, Ricard Faulkner PCP.   Total time spent:30 Minutes Time spent includes review of chart, medications, test results, and follow up plan with the patient.   [place] Controlled Substance Database was reviewed by me.  This patient was seen by Laurence Pons, FNP-C in collaboration with Dr. Verneta Gone as a part of collaborative care agreement.   Keshawn Fiorito R. Bobbi Burow, MSN, FNP-C Internal medicine

## 2023-07-17 ENCOUNTER — Other Ambulatory Visit: Payer: Self-pay

## 2023-07-17 ENCOUNTER — Emergency Department
Admission: EM | Admit: 2023-07-17 | Discharge: 2023-07-17 | Disposition: A | Discharge status: Home / Self Care | Attending: Emergency Medicine | Admitting: Emergency Medicine

## 2023-07-17 DIAGNOSIS — Z992 Dependence on renal dialysis: Secondary | ICD-10-CM | POA: Diagnosis not present

## 2023-07-17 DIAGNOSIS — I953 Hypotension of hemodialysis: Secondary | ICD-10-CM | POA: Insufficient documentation

## 2023-07-17 DIAGNOSIS — N186 End stage renal disease: Secondary | ICD-10-CM | POA: Insufficient documentation

## 2023-07-17 DIAGNOSIS — I959 Hypotension, unspecified: Secondary | ICD-10-CM | POA: Diagnosis present

## 2023-07-17 DIAGNOSIS — D696 Thrombocytopenia, unspecified: Secondary | ICD-10-CM | POA: Diagnosis not present

## 2023-07-17 LAB — CBC
HCT: 36.1 % — ABNORMAL LOW (ref 39.0–52.0)
Hemoglobin: 11.5 g/dL — ABNORMAL LOW (ref 13.0–17.0)
MCH: 28.6 pg (ref 26.0–34.0)
MCHC: 31.9 g/dL (ref 30.0–36.0)
MCV: 89.8 fL (ref 80.0–100.0)
Platelets: 96 10*3/uL — ABNORMAL LOW (ref 150–400)
RBC: 4.02 MIL/uL — ABNORMAL LOW (ref 4.22–5.81)
RDW: 17.4 % — ABNORMAL HIGH (ref 11.5–15.5)
WBC: 3.8 10*3/uL — ABNORMAL LOW (ref 4.0–10.5)
nRBC: 0 % (ref 0.0–0.2)

## 2023-07-17 LAB — CREATININE, SERUM
Creatinine, Ser: 8.15 mg/dL — ABNORMAL HIGH (ref 0.61–1.24)
GFR, Estimated: 8 mL/min — ABNORMAL LOW (ref >60)

## 2023-07-17 LAB — COMPREHENSIVE METABOLIC PANEL
ALT: 20 U/L (ref 0–44)
AST: 19 U/L (ref 15–41)
Albumin: 3.6 g/dL (ref 3.5–5.0)
Alkaline Phosphatase: 73 U/L (ref 38–126)
Anion gap: 13 (ref 5–15)
BUN: 31 mg/dL — ABNORMAL HIGH (ref 6–20)
CO2: 27 mmol/L (ref 22–32)
Calcium: 8.9 mg/dL (ref 8.9–10.3)
Chloride: 99 mmol/L (ref 98–111)
Creatinine, Ser: UNDETERMINED mg/dL (ref 0.61–1.24)
Glucose, Bld: 107 mg/dL — ABNORMAL HIGH (ref 70–99)
Potassium: 4.1 mmol/L (ref 3.5–5.1)
Sodium: 139 mmol/L (ref 135–145)
Total Bilirubin: 0.9 mg/dL (ref 0.0–1.2)
Total Protein: 7.5 g/dL (ref 6.5–8.1)

## 2023-07-17 NOTE — Discharge Instructions (Signed)
 Please make sure you eat and drink prior to future dialysis sessions.  Your platelet count was slightly low today but this has been seen in the past as well.  Continue to follow this with your nephrology team and primary care provider.

## 2023-07-17 NOTE — ED Provider Notes (Signed)
 [facility] Provider Note    Event Date/Time   First MD Initiated Contact with Patient 07/17/23 1328     (approximate)   History   Hypotension   HPI [name] Walker is a 48 y.o. male with history of ESRD on dialysis presenting today for hypotension.  Patient was reportedly at his normal dialysis session today.  Had an episode of hypotension while at dialysis and was sent to the ED for further evaluation.  He said he briefly felt lightheaded and slightly sweaty.  Denied chest pain, loss of consciousness, vomiting, difficulty breathing.  Otherwise denies any other acute complaints at this time.  He does note he did not eat or drink anything today before dialysis.     Physical Exam   Triage Vital Signs: ED Triage Vitals  Encounter Vitals Group     BP 07/17/23 1235 108/68     Systolic BP Percentile --      Diastolic BP Percentile --      Pulse Rate 07/17/23 1235 81     Resp 07/17/23 1235 18     Temp 07/17/23 1235 97.8 F (36.6 C)     Temp src --      SpO2 07/17/23 1235 100 %     Weight 07/17/23 1236 250 lb (113.4 kg)     Height 07/17/23 1236 5\' 9"  (1.753 m)     Head Circumference --      Peak Flow --      Pain Score 07/17/23 1235 0     Pain Loc --      Pain Education --      Exclude from Growth Chart --     Most recent vital signs: Vitals:   07/17/23 1235  BP: 108/68  Pulse: 81  Resp: 18  Temp: 97.8 F (36.6 C)  SpO2: 100%   I have reviewed the vital signs. General:  Awake, alert, no acute distress. Head:  Normocephalic, Atraumatic. EENT:  PERRL, EOMI, Oral mucosa pink and moist, Neck is supple. Cardiovascular: Regular rate, 2+ distal pulses. Respiratory:  Normal respiratory effort, symmetrical expansion, no distress.   Extremities:  Moving all four extremities through full ROM without pain.   Neuro:  Alert and oriented.  Interacting appropriately.   Skin:  Warm, dry, no rash.   Psych: Appropriate affect.     ED Results / Procedures  / Treatments   Labs (all labs ordered are listed, but only abnormal results are displayed) Labs Reviewed  CBC - Abnormal; Notable for the following components:      Result Value   WBC 3.8 (*)    RBC 4.02 (*)    Hemoglobin 11.5 (*)    HCT 36.1 (*)    RDW 17.4 (*)    Platelets 96 (*)    All other components within normal limits  COMPREHENSIVE METABOLIC PANEL - Abnormal; Notable for the following components:   Glucose, Bld 107 (*)    BUN 31 (*)    All other components within normal limits  CREATININE, SERUM - Abnormal; Notable for the following components:   Creatinine, Ser 8.15 (*)    GFR, Estimated 8 (*)    All other components within normal limits     EKG    RADIOLOGY    PROCEDURES:  Critical Care performed: No  Procedures   MEDICATIONS ORDERED IN ED: Medications - No data to display   IMPRESSION / MDM / ASSESSMENT AND PLAN / ED COURSE  I reviewed the triage  vital signs and the nursing notes.                              Differential diagnosis includes, but is not limited to, dialysis induced hypotension, vasovagal episode, dehydration  Patient's presentation is most consistent with acute complicated illness / injury requiring diagnostic workup.  Patient is a 48 year old male presenting today for hypotension at dialysis.  At this time, he currently feels completely asymptomatic.  Blood pressure 108/68 on arrival but no lightheadedness.  Physical exam unremarkable.  CBC only notable for slight thrombocytopenia but looking back on prior outpatient labs has had similar levels.  CMP reassuring with no obvious electrolyte abnormalities.  Patient tolerated p.o. here and still feeling asymptomatic.  Safe for discharge and suspect dialysis induced hypotension and recommend he make sure he eats and drinks before going to his next dialysis session.  He was agreeable with this plan.     FINAL CLINICAL IMPRESSION(S) / ED DIAGNOSES   Final diagnoses:   Hemodialysis-associated hypotension     Rx / DC Orders   ED Discharge Orders     None        Note:  This document was prepared using Dragon voice recognition software and may include unintentional dictation errors.   Janith Lima, MD 07/17/23 [phone]

## 2023-07-17 NOTE — ED Triage Notes (Signed)
 Pt to ED [facility] from dialysis for hypotension. Did not finish dialysis. Reports usually takes meds for Htn except for dialysis days. Pt denies complaints.

## 2023-08-22 ENCOUNTER — Encounter: Payer: Self-pay | Admitting: Nurse Practitioner

## 2023-09-15 ENCOUNTER — Encounter: Payer: Self-pay | Admitting: Nurse Practitioner

## 2023-09-15 ENCOUNTER — Encounter (INDEPENDENT_AMBULATORY_CARE_PROVIDER_SITE_OTHER): Payer: Self-pay

## 2023-09-15 ENCOUNTER — Ambulatory Visit: Admitting: Nurse Practitioner

## 2023-09-15 VITALS — BP 130/84 | HR 89 | Temp 98.4°F | Resp 16 | Ht 69.0 in | Wt 247.0 lb

## 2023-09-15 DIAGNOSIS — K429 Umbilical hernia without obstruction or gangrene: Secondary | ICD-10-CM

## 2023-09-15 DIAGNOSIS — N186 End stage renal disease: Secondary | ICD-10-CM | POA: Diagnosis not present

## 2023-09-15 DIAGNOSIS — G8918 Other acute postprocedural pain: Secondary | ICD-10-CM

## 2023-09-15 DIAGNOSIS — E1122 Type 2 diabetes mellitus with diabetic chronic kidney disease: Secondary | ICD-10-CM

## 2023-09-15 DIAGNOSIS — E1159 Type 2 diabetes mellitus with other circulatory complications: Secondary | ICD-10-CM | POA: Diagnosis not present

## 2023-09-15 DIAGNOSIS — Z992 Dependence on renal dialysis: Secondary | ICD-10-CM

## 2023-09-15 DIAGNOSIS — I152 Hypertension secondary to endocrine disorders: Secondary | ICD-10-CM

## 2023-09-15 LAB — POCT GLYCOSYLATED HEMOGLOBIN (HGB A1C): Hemoglobin A1C: 4.7 % (ref 4.0–5.6)

## 2023-09-15 MED ORDER — LIDOCAINE-PRILOCAINE 2.5-2.5 % EX CREA: TOPICAL_CREAM | CUTANEOUS | 11 refills | Status: AC

## 2023-09-15 MED ORDER — MOUNJARO 7.5 MG/0.5ML ~~LOC~~ SOAJ: 7.5000 mg | SUBCUTANEOUS | 5 refills | Status: DC

## 2023-09-15 NOTE — Progress Notes (Signed)
 [facility] [address] [place], [place] [address]  Internal MEDICINE  Office Visit Note  Patient Name: [name] Walker  [id number]  [id number]  Date of Service: 09/15/2023  Chief Complaint  Patient presents with   Diabetes   Hypertension   Follow-up    HPI [name] presents for a follow-up visit for umbilical hernia, diabetes and hemodialysis.  Umbilical hernia -- prior repair done and it is now recurring. Intermittent mild pain but no severe or constant pain. Some discomfort. Diabetes -- A1c has improved significantly to 4.7, was previously 6.1. he is taking mounjaro  and doing well.  Hemodialysis -- needs a better numbing cream for his fistula access site when they use it for his dialysis.     Current Medication: Outpatient Encounter Medications as of 09/15/2023  Medication Sig Note   acetaminophen  (TYLENOL ) 500 MG tablet Take 500 mg by mouth every 6 (six) hours as needed.    B Complex-C-Folic Acid (RENA-VITE RX) 1 MG TABS Take 1 tablet by mouth daily.    celecoxib  (CELEBREX ) 100 MG capsule Take 1 capsule (100 mg total) by mouth 2 (two) times daily.    cholecalciferol (VITAMIN D3) 25 MCG (1000 UNIT) tablet Take 1,000 Units by mouth daily.    cyanocobalamin (VITAMIN B12) 1000 MCG tablet Take 1,000 mcg by mouth 4 (four) times a week. Takes on non - dialysis days    gabapentin  (NEURONTIN ) 100 MG capsule Take 100 mg by mouth at bedtime.    hydrOXYzine  (ATARAX ) 25 MG tablet Take 1 tablet (25 mg total) by mouth 2 (two) times daily as needed (anxiety/sleep).    lidocaine -prilocaine  (EMLA ) cream Apply a quarter size amount of cream to the the access site of the left AV fistula 1.5 hours before dialysis appointment on Mondays, Wednesday and Fridays    losartan  (COZAAR ) 50 MG tablet Take 50 mg by mouth daily.    NON FORMULARY CPAP everynight 12/30/2017: PRN   rOPINIRole  (REQUIP ) 1 MG tablet Take 1 tablet (1 mg total) by mouth 3 (three) times daily.    sertraline (ZOLOFT) 50 MG  tablet Take 50 mg by mouth daily.    sevelamer carbonate (RENVELA) 800 MG tablet Take 1,600 mg by mouth 3 (three) times daily.    torsemide (DEMADEX) 20 MG tablet Take 20 mg by mouth. Tues, Thursday, Saturday,Sunday    [DISCONTINUED] Lidocaine  5 % CREA Apply topical cream to dialysis access site 30 min to 1 hour before starting dialysis on dialysis days 3 times weekly.    [DISCONTINUED] tirzepatide  (MOUNJARO ) 7.5 MG/0.5ML Pen Inject 7.5 mg into the skin once a week.    carvedilol (COREG) 25 MG tablet Take 25 mg by mouth 2 (two) times daily with a meal. 07/14/2023: Stopped taking this medication due to BP being too low.    tirzepatide  (MOUNJARO ) 7.5 MG/0.5ML Pen Inject 7.5 mg into the skin once a week.    [DISCONTINUED] sildenafil  (VIAGRA ) 50 MG tablet Take 1 tablet (50 mg total) by mouth daily as needed for erectile dysfunction.    [DISCONTINUED] tadalafil (CIALIS) 10 MG tablet Take 1 tablet (10 mg total) by mouth daily as needed for erectile dysfunction.    No facility-administered encounter medications on file as of 09/15/2023.    Surgical History: Past Surgical History:  Procedure Laterality Date   AV FISTULA PLACEMENT Left 10/02/2022   Procedure: ARTERIOVENOUS (AV) FISTULA CREATION (RADIALCEPHALIC);  Surgeon: Celso College, MD;  Location: [facility] ORS;  Service: Vascular;  Laterality: Left;   DIALYSIS/PERMA CATHETER  INSERTION N/A 06/25/2022   Procedure: DIALYSIS/PERMA CATHETER INSERTION;  Surgeon: Celso College, MD;  Location: [facility] INVASIVE CV LAB;  Service: Cardiovascular;  Laterality: N/A;   TONSILLECTOMY  1985   UMBILICAL HERNIA REPAIR      Medical History: Past Medical History:  Diagnosis Date   Anxiety    Chronic kidney disease    ESRD   Diabetes mellitus without complication (HCC)    hgb A!C 6.0   Family history of adverse reaction to anesthesia    mother states that she was able to feel everything during a procedure   Hypertension    Neuromuscular disorder (HCC)    Pulmonary  embolism (HCC)    Sleep apnea     Family History: Family History  Problem Relation Age of Onset   Diabetes Mother    Cancer Mother        lung    Social History   Socioeconomic History   Marital status: Married    Spouse name: ,[name] Walker   Number of children: 3   Years of education: Not on file   Highest education level: Not on file  Occupational History   Occupation: Paediatric nurse  Tobacco Use   Smoking status: Some Days    Types: Cigarettes, Cigars, E-cigarettes   Smokeless tobacco: Never  Vaping Use   Vaping status: Some Days   Substances: Nicotine , Flavoring  Substance and Sexual Activity   Alcohol use: Not Currently    Comment: 1 glass of wine daily   Drug use: Never   Sexual activity: Yes    Birth control/protection: None  Other Topics Concern   Not on file  Social History Narrative   Not on file   Social Drivers of Health   Financial Resource Strain: Not on file  Food Insecurity: Not on file  Transportation Needs: Not on file  Physical Activity: Insufficiently Active (09/14/2018)   Exercise Vital Sign    Days of Exercise per Week: 1 day    Minutes of Exercise per Session: 10 min  Stress: No Stress Concern Present (09/14/2018)   [employer] of Occupational Health - Occupational Stress Questionnaire    Feeling of Stress : Only a little  Social Connections: Moderately Integrated (09/14/2018)   Social Connection and Isolation Panel [NHANES]    Frequency of Communication with Friends and Family: Twice a week    Frequency of Social Gatherings with Friends and Family: Twice a week    Attends Religious Services: 1 to 4 times per year    Active Member of [employer] or Organizations: No    Attends Banker Meetings: Never    Marital Status: Married  Catering manager Violence: Not At Risk (09/14/2018)   Humiliation, Afraid, Rape, and Kick questionnaire    Fear of Current or Ex-Partner: No    Emotionally Abused: No    Physically Abused: No    Sexually  Abused: No      Review of Systems  Vital Signs: BP 130/84   Pulse 89   Temp 98.4 F (36.9 C)   Resp 16   Ht 5' 9 (1.753 m)   Wt 247 lb (112 kg)   SpO2 99%   BMI 36.48 kg/m    Physical Exam     Assessment/Plan: 1. Umbilical hernia without obstruction and without gangrene Referred to general surgery for surgical repair.  - Ambulatory referral to General Surgery  2. Type 2 diabetes mellitus with chronic kidney disease on chronic dialysis, without long-term current use of insulin  (HCC) (  Primary) A1c is well controlled and in normal range. Mounjaro  dose at 7.5 mg, continue as prescribed.  - POCT glycosylated hemoglobin (Hb A1C) - tirzepatide  (MOUNJARO ) 7.5 MG/0.5ML Pen; Inject 7.5 mg into the skin once a week.  Dispense: 2 mL; Refill: 5  3. Hypertension associated with diabetes (HCC) Stable, continue losartan , carvedilol and torsemide as prescribed.   4. Hemodialysis access site with mature fistula (HCC) Topical numbing agent prescribed to use at dialysis when they access his AV fistula  - lidocaine -prilocaine  (EMLA ) cream; Apply a quarter size amount of cream to the the access site of the left AV fistula 1.5 hours before dialysis appointment on Mondays, Wednesday and Fridays  Dispense: 30 g; Refill: 11  5. Pain associated with surgical procedure Topical numbing agent prescribed to use at dialysis when they access his AV fistula  - lidocaine -prilocaine  (EMLA ) cream; Apply a quarter size amount of cream to the the access site of the left AV fistula 1.5 hours before dialysis appointment on Mondays, Wednesday and Fridays  Dispense: 30 g; Refill: 11   General Counseling: [name] verbalizes understanding of the findings of todays visit and agrees with plan of treatment. I have discussed any further diagnostic evaluation that may be needed or ordered today. We also reviewed his medications today. he has been encouraged to call the office with any questions or concerns that should  arise related to todays visit.    Orders Placed This Encounter  Procedures   Ambulatory referral to General Surgery   POCT glycosylated hemoglobin (Hb A1C)    Meds ordered this encounter  Medications   lidocaine -prilocaine  (EMLA ) cream    Sig: Apply a quarter size amount of cream to the the access site of the left AV fistula 1.5 hours before dialysis appointment on Mondays, Wednesday and Fridays    Dispense:  30 g    Refill:  11    Fill new script today   tirzepatide  (MOUNJARO ) 7.5 MG/0.5ML Pen    Sig: Inject 7.5 mg into the skin once a week.    Dispense:  2 mL    Refill:  5    Dx code E11.65, initial A1c was 6.5 at diagnosis but is well controlled with mounjaro .    Return in about 6 months (around 03/17/2024) for F/U, Recheck A1C, Torrin Crihfield PCP.   Total time spent:30 Minutes Time spent includes review of chart, medications, test results, and follow up plan with the patient.   [place] Controlled Substance Database was reviewed by me.  This patient was seen by Laurence Pons, FNP-C in collaboration with Dr. Verneta Gone as a part of collaborative care agreement.   Demarcus Thielke R. Bobbi Burow, MSN, FNP-C Internal medicine

## 2023-09-16 ENCOUNTER — Telehealth: Payer: Self-pay | Admitting: Nurse Practitioner

## 2023-09-16 NOTE — Telephone Encounter (Signed)
 Awaiting 09/15/23 office notes for General Surgery referral-Toni

## 2023-10-07 ENCOUNTER — Telehealth: Payer: Self-pay | Admitting: Nurse Practitioner

## 2023-10-07 ENCOUNTER — Other Ambulatory Visit: Payer: Self-pay | Admitting: Nurse Practitioner

## 2023-10-07 ENCOUNTER — Encounter: Payer: Self-pay | Admitting: Nurse Practitioner

## 2023-10-07 DIAGNOSIS — G4709 Other insomnia: Secondary | ICD-10-CM

## 2023-10-07 NOTE — Telephone Encounter (Signed)
 General Surgery referral faxed to [facility] due to insurance; [phone]. Lvm notifying patient. Gave pt telephone [phone]

## 2023-10-20 ENCOUNTER — Ambulatory Visit: Admitting: Nurse Practitioner

## 2023-12-13 NOTE — ED Notes (Signed)
 Bed: 13-A Expected date:  Expected time:  Means of arrival:  Comments: [facility] transfer

## 2023-12-13 NOTE — ED Provider Notes (Signed)
 Received sign out from previous provider.  Patient Summary: [name] Walker is a 48 y.o. male with a history of hypertension, hyperlipidemia, CKD on HD (Monday Wednesday Friday), type 2 diabetes, OSA, PE, rhabdomyolysis secondary to sepsis, presenting with generalized weakness.  Found to be hyperkalemic to 7.3.  Received Lokelma and insulin  at [facility] prior to transfer.  Immediately prior to EMS arrival at OSH patient was found to be hypoglycemic and received D50.  Will continue to monitor glucose.  Nephrology paged. Action List:  Notified nephrology Disposition  Updates ED Course as of 12/14/23 0546  [place] Dec 13, 2023  1901 Nephrology paged to notify of patient's arrival.  1922 POCT Glucose:   Glucose, POC 121 Glucose improved on reassessment  2107 Basic metabolic panel(!!):   Sodium 133(!)  Potassium 7.1(!!)  Chloride 90(!)  CO2 20.0  Anion Gap 23(!)  Bun 102(!)  Creatinine 20.31(!)  BUN/Creatinine Ratio 5  eGFR CKD-EPI (2021) Male 3(!)  Glucose 100  Calcium  8.0(!) Minimal change in potassium on recheck prior to initiation of dialysis.  2237 Nephrology requested we provide a second dose of Lokelma due to delay in initiating HD.  2323 Patient continues to await HD.  Mon Dec 14, 2023  0145 Basic metabolic panel(!!):   Sodium 133(!)  Potassium 7.3(!!)  Chloride 87(!)  CO2 21.0  Anion Gap 25(!)  Bun 107(!)  Creatinine 20.42(!)  BUN/Creatinine Ratio 5  eGFR CKD-EPI (2021) Male 3(!)  Glucose 87  Calcium  8.2(!)  0145 Phosphorus Level(!):   Phosphorus 16.4(!)  0145 Magnesium Level(!):   Magnesium 2.9(!)  0359 CBC w/ Differential(!):   WBC 3.9  RBC 3.57(!)  HGB 10.8(!)  HCT 32.0(!)  MCV 89.5  MCH 30.3  MCHC 33.9  RDW 16.7(!)  MPV 8.4  Platelet 151  Neutrophils % 57.7  Lymphocytes % 29.8  Monocytes % 9.7  Eosinophils % 1.9  Basophils % 0.9  Absolute Neutrophils 2.3  Absolute Lymphocytes 1.2  Absolute Monocytes  0.4  Absolute Eosinophils 0.1  Absolute  Basophils  0.0  Anisocytosis Slight(!)  0432 Basic metabolic panel(!):   Sodium 138  Potassium 4.8  Chloride 95(!)  CO2 20.0  Anion Gap 23(!)  Bun 73(!)  Creatinine 13.22(!)  BUN/Creatinine Ratio 6  eGFR CKD-EPI (2021) Male 4(!)  Glucose 76  Calcium  8.0(!)  0432 Magnesium Level:   Magnesium 2.3  0433 Phosphorus Level(!):   Phosphorus 8.9(!)  0435 Nephrology paged.  0435 Iron and TIBC(!):   Iron 124  TIBC 243(!)  Iron Saturation (%) 51  0542 Reviewed nephrology's note which recommends stopping losartan  and Celebrex .  We will direct the patient to follow-up as scheduled for his hemodialysis later today per Nephrology recommendations.  Additional supportive care and return precautions provided.

## 2023-12-13 NOTE — Consults (Signed)
 ------------------------------------------------------------------------------- Attestation signed by Guidici, Jessica Leigh, MD at 12/14/23 [phone] ______________________________________________________ ATTENDING PHYSICIAN  I was the supervising physician in the service of this patient. Unfortunately this patient was discharged from the ED prior to me being able to assess him. - Harlene LITTIE Freestone, MD  -------------------------------------------------------------------------------  Medicine Care Advancement Team (MCAT) Initial Consult Note   Assessment and Recommendations:  [name] Walker is a 48 y.o. male who is presenting to [facility] with Hyperkalemia, in the setting of the following pertinent/contributing co-morbidities: ERSD on dialysis, s/p incomplete dialysis session. Pt was seen at the request of No att. providers found (Emergency Medicine) for co-management.  Principal Problem:   Hyperkalemia Active Problems:   ESRD (end stage renal disease) on dialysis      Active Problems  Hyperkalemia Patient with hyperkalemia in the setting of an incomplete dialysis session on Friday 8/15 secondary to hypotension. He presented to the emergency department today with generalized weakness. Nephrology was consulted so recommended inpatient dialysis and this has not yet been completed. He is s/p 1x insulin  5u and lokelma 10g x 2 for hyperkalemia treatment.  - Would continue to check q4hr BMP while awaiting dialysis  - Per Nephrology recommendations:  - If potassium is greater than 6 obtain EKG               - If EKG changes are present provide calcium  gluconate 2 g              - If potassium is greater than 6.5 provide calcium  gluconate 2 g despite EKG  - If EKG changes are present or potassium is greater than 6.5 provide shifting agents with insulin  (5 units) and dextrose                - If EKG changes are NOT present and potassium is less than 6.5 DO NOT provide shifting agents               - If  insulin  is given please watch glucose q2hr for 6 hours              - Also consider administering lokelma each time potassium is greater than 6 - Nephrology to determine need for continued dialysis while inpatient   ESRD on dialysis (MWF) Patient with a history of ESRD on dialysis likely 2/2 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 He follows with [facility]. He has been undergoing renal transplant evaluation. He has been taking Mounjaro  for weight loss in order to be a candidate for transplantation.  - Nephrology following; appreciate assistance  - Continue renvela 1,600mg    ? Melena  Patient notes he has been having some dark stools, he first noticed this when he started renvela. Had a colonoscopy in June 2025 and had a polyp removed at that time. There was also evidence of internal hemorrhoids. No complaints of abdominal pain or significant constipation. Hgb 11.3 with normal MCV, appears relatively stable from baseline. Will check iron studies to see if there is a component of iron deficiency. - CBC daily - Monitor for signs of rectal bleeding - Follow-up iron studies   Hypotension  Weight loss  Patient with recent hypotension issues causing interference with dialysis. Home medications include coreg and losartan . He has also been taking Mounjaro  in attempt to lose weight for renal transplant medical optimization. He states that he has lost almost 140lbs since starting Mounjaro . Wondering if his significant weight loss is contributing to his diminished ability to  tolerate dialysis.  - Hold home losartan ; should likely discontinue as this can also cause hyperkalemia  - Hold home coreg while inpatient, may require dose adjustment   Checklist: Diet: Regular Diet DVT PPx: Not Indicated - Padua Score <4 Code Status: Full Code Dispo: Patient appropriate for Observation based on expectation of ongoing need for hospitalization less than two midnights and/or low  intensity of services provided   Target Team: DestinationTeam: Hospitalist Service versus [facility] CCM Communication:  None Anticipated   For questions between 7:30AM-5PM, please page the MCAT resident pager at [phone]. After 5:30 PM, the MCAT service is covered by the [facility] On-Call Resident for urgent/emergent questions or concerns.    Significant Comorbid Conditions <redacted file path>: -Anemia POA requiring further investigation or monitoring -Chronic kidney disease POA requiring further investigation, treatment, or monitoring -Hyperkalemia POA requiring further investigation, treatment, or monitoring   Subjective:  [name] Walker is a 48 y.o. male with pertinent PMHx of ESRD on dialysis, TIIDM, HLD, OSA,  presenting with hyperkalemia in the setting of missed dialysis.    HPI:  Spoke with patient Emergency Department.  He had been awaiting dialysis.  He states that he had been having issues tolerating pulling off more volume and dialysis recently secondary to hypotension.  He states that for the most part he has been able to tolerate the full sessions however this past Friday, 8/15 he did not complete the entire session.  He woke up the morning of 8/17 and was feeling overall generally weak.  He presented to the emergency department and was found to be hyperkalemic.  He denies other symptoms including chest pain, shortness of breath, headache, dizziness, vision changes, or further medical complaints.  He does not make urine.  He has been evaluated for renal transplant in outpatient setting but he is being medically optimized as he has a history of morbid obesity.  Of note, he has been taking Mounjaro  for weight loss for renal transplant medical optimization.  He says that he has lost almost 140 pounds since taking Mounjaro .  He has still been taking his Coreg and losartan  at home for blood pressure.  He typically does not feel dizzy at home while taking his blood pressure medications.  States  that he actually usually still runs high.  He did note to the ED provider that he has been having some dark stools.  However he did just get a colonoscopy in June and had a polyp removed.  There was note of internal hemorrhoids at that time.  He does not complain of significant constipation or diarrhea.  Does not have a history of iron deficiency anemia that he is aware of and does not take iron supplementation.  In the ED: Initial labs notable for hyperkalemia at 7.1, creatinine of 20, phosphorus of 16.4, likely metabolic acidosis with a pH of 7.23 and bicarb of 19 on VBG.   Allergies Amlodipine , Ace inhibitors, Iodinated contrast media, and Daptomycin  I reviewed the Medication List. The current list is Accurate Prior to Admission medications  Medication Dose, Route, Frequency  carvedilol (COREG) 25 MG tablet 25 mg, 2 times a day (standard)  celecoxib  (CELEBREX ) 100 MG capsule 100 mg  colchicine  (MITIGARE ) 0.6 mg cap capsule Take 2 tablets (1.2 mg) now and one tablet (0.6 mg) an hour later. Patient taking differently: Take 1 capsule (0.6 mg total) by mouth as needed. Take 2 tablets (1.2 mg) now and one tablet (0.6 mg) an hour later.  cyanocobalamin, vitamin B-12, 1000  MCG tablet 1,000 mcg  diclofenac sodium (VOLTAREN) 1 % gel 1 Application Patient taking differently: Apply 4 g topically as needed.  escitalopram oxalate (LEXAPRO) 10 MG tablet 1 tablet, Daily (standard)  hydrOXYzine  (ATARAX ) 25 MG tablet   lidocaine -prilocaine  (EMLA ) 2.5-2.5 % cream APPLY A SMALL AMOUNT TO SKIN 3 TIMES A WEEK AS DIRECTED 30-45 MINUTES PRIOR TO DIALYSIS  losartan  (COZAAR ) 25 MG tablet No dose, route, or frequency recorded.  RENA-VITE 0.8 mg Tab 1 tablet, Daily (standard)  rOPINIRole  (REQUIP ) 0.5 MG tablet   sevelamer (RENVELA) 800 mg tablet 1,600 mg  tirzepatide  (MOUNJARO ) 7.5 mg/0.5 mL PnIj 7.5 mg, Subcutaneous, Every 7 days  torsemide (DEMADEX) 10 MG tablet   traZODone (DESYREL) 50 MG tablet 25 mg,  Nightly    Designated Healthcare Decision Maker: [name] Walker currently has decisional capacity for healthcare decision-making and is able to designate a surrogate healthcare decision maker. [name] Walker designated healthcare decision maker(s) is/are Auburn Hert (the patient's spouse) as denoted by stated patient preference.  Objective:  Physical Exam: Temp:  [36.7 C (98 F)-36.8 C (98.2 F)] 36.8 C (98.2 F) Pulse:  [68-101] 76 SpO2 Pulse:  [72-105] 72 Resp:  [12-23] 16 BP: (88-165)/(47-111) 148/47 SpO2:  [94 %-99 %] 95 %  General appearance - Normal,  in no acute distress, alert Skin - Skin color, texture, turgor normal. No rashes or lesions. Eyes - Conjunctivae/corneas clear. PERRL, EOM's intact. No icterus. HEENT - Neck Supple. Oropharynx clear, no exudates.  Moist mucous membranes. Lungs -  Normal work of breathing. Lungs clear to auscultation. Heart - No murmurs, rubs, or gallops.  Regular rate and rhythm. Abdomen - Abdomen soft, non-tender. BS normal.  Extremities -  Extremities normal. No deformities, edema, or skin discoloration Peripheral pulses - normal, capilliary refill <2secs  Labs/Studies: Labs and Studies from the last 24hrs per EMR and Reviewed  Imaging: Radiology studies were personally reviewed

## 2023-12-13 NOTE — Consults (Signed)
 ------------------------------------------------------------------------------- Attestation signed by Germaine Odella Ahr, MD at 12/14/23 1518  Pt was seen at the request of No att. providers found (/Emergency Medicine) for hyperK and ESKD, exacerbation. I did not see the patient.  I reviewed the resident's note.  I agree with the resident's findings and plan. [name], MD      -------------------------------------------------------------------------------    Nephrology ESKD Consult Note  Requesting provider: Collyn Tamsen Murray, MD Service requesting consult: Emergency Medicine Reason for consult: Evaluation for dialysis needs  Outpatient dialysis unit:  [facility] [address] [place] [address]   Assessment/Recommendations: [name] Walker is a 48 y.o. male with a past medical history notable for ESKD on in-center hemodialysis being evaluated at [facility] for hyperkalemia.  # ESKD:  - OP dialysis prescription not available for review. We will request OP dialysis records and update prescription as appropriate - Indication for acute dialysis?: Yes, hyperkalemia - We will perform HD starting today and will otherwise attempt to continue pt's outpatient schedule if possible.  - Plan for a 2 hour HD session. Please recheck K following tx to determine if repeat HD needed inpatient  - Access: LUE AV fistula; PD catheter functioning well- no indication for intervention. - Hepatitis status: Hepatitis B surface antigen negative on 09/17/2023.  - He has been taking losartan  outpatient - recommend stopping losartan    #Hyperkalemia Labs notable for potassium of 7.3. No missed dialysis sessions but has had recent sessions cut short, and labs are suggestive of poor clearance (very elevated BUN). No evidence of peaked T waves or other EKG changes.  - S/p lokelma x1 and shifted (c/b hypoglycemia, please continue watching his BG)  - Telemetry - Consider q4-6h BMP  checks             -- If potassium is greater than 6 obtain EKG             -- If EKG changes are present provide calcium  gluconate 2 g             -- If potassium is greater than 6.5 provide calcium  gluconate 2 g despite EKG             -- If EKG changes are present or potassium is greater than 6.5 provide shifting agents with insulin  (5 units) and dextrose              -- If EKG changes are NOT present and potassium is less than 6.5 DO NOT provide shifting agents             -- If insulin  is given please watch glucose q2hr for 6 hours             --Also consider administering lokelma each time potassium is greater than 6  #Melena He reports dark and sticky stool intermittently over the past few months. Also having nausea associated with eating and reflux symptoms. Intermittent hypotension with dialysis has only occurred recently and difficult to explain his significantly elevated potassium/BUN in the absence of missed HD sessions, so GIB a consideration. Discussed with ED. His hemoglobin has gone from 12.4 --> 11.3 in the last few days. Likely worth working up for possible UGIB. He thankfully is hemodynamically stable.  - Workup for GIB per ED  - Stop celebrex    #Recent episode of slurred speech Episode of slurred speech with confusion about 2 weeks ago, now resolved and only happened once. Discussed with wife/patient that it could have been a  mini-stroke and that he is certainly at higher risk for mini strokes/strokes. Recommended presenting to the hospital if this happens again, but he should follow-up with his PCP to consider utility of brain MRI.   # Volume / Hypertension: - Volume: Will attempt to achieve dry weight if tolerated - Will adjust daily as needed  # Acid-Base / Electrolytes: - Will evaluate acid-base status and electrolyte balance daily and adjust dialysate as needed. Lab Results  Component Value Date   K 7.3 (HH) 12/13/2023   CO2 21.0 12/13/2023    # Anemia of Chronic  Kidney Disease: - ESA: unknown - IV Iron/ PO oral iron: unknown - Outpatient meds not available for review and therefore not ordered Lab Results  Component Value Date   HGB 11.3 (L) 12/13/2023    # Secondary Hyperparathyroidism/Hyperphosphatemia: - Phosphate binders : unknown - Activated Vitamin D Analogs: unknown - Calcimimetics: unknown - Outpatient meds not available for review and therefore not ordered Lab Results  Component Value Date   PHOS 3.5 10/31/2018   CALCIUM  8.3 (L) 12/13/2023    Tinnie JONETTA Kaufman, MD 12/13/2023 4:45 PM  Medical decision-making for 12/13/23  Findings / Data    Patient has: []  acute illness w/systemic sxs  [mod] []  two or more stable chronic illnesses [mod] []  one chronic illness with acute exacerbation [mod] []  acute complicated illness  [mod] []  Undiagnosed new problem with uncertain prognosis  [mod] [x]  illness posing risk to life or bodily function (ex. AKI)  [high] []  chronic illness with severe exacerbation/progression  [high] []  chronic illness with severe side effects of treatment  [high] ESKD on RRT Probs At least 2:  Probs, Data, Risk  I reviewed: []  primary team note []  consultant note(s) [x]  external records [x]  chemistry results [x]  CBC results []  blood gas results []  Other []  procedure/op note(s)  []  radiology report(s) []  micro result(s) []  w/ independent historian(s) K and Hb addressed above >=3 Data Review (2 of 3)   I independently interpreted: []  Urine Sediment []  Renal US  []  CXR Images []  CT Images []  Other [x]  EKG Tracing No peaked T waves  Any    I discussed: []  Pathology results w/ QHPs(s) from other specialties []  Procedural findings w/ QHPs(s) from other specialties []  Imaging w/ QHP(s) from other specialties [x]  Treatment plan w/ QHP(s) from other specialties Plan discussed with primary team Any    Mgm't requires: []  Prescription drug(s)  [mod] []  Kidney biopsy  [mod] []  Central line placement  [mod] []  High risk  medication use and/or intensive toxicity monitoring [high] [x]  Renal replacement therapy [high] []  High risk kidney biopsy  [high] []  Escalation of care  [high] []  High risk central line placement  [high] RRT: High risk of complications from RRT requiring intensive monitoring Risk     _____________________________________________________________________________________  History of Present Illness: [name] Walker is a 48 y.o. male with ESKD on dialysis as well as HTN, HLD, T2DM, OSA, prior PE, and prior rhabdomyolysis being evaluated at [facility] for hyperkalemia who is seen in consultation at the request of Collyn Tamsen Murray, MD and Emergency Medicine. Nephrology has been consulted for evaluation of dialysis needs in the setting of end-stage kidney disease.  Presented to [facility] with unsteady gait and leg weakness. He felt that his legs were extremely weak, describes it similar to a newborn animal that can't walk. Found to have a K of 7.3. On 8/13, had been found to have a K of 6.1 prior to a surgery (surgery  deferred and sent home). He has not missed any dialysis sessions, but has had sessions cut short due to hypotension intermittently over the last 1.5 weeks or so. This is a new issue for him. His wife also reports an episode of slurred speech, stumbling, confusion about 2 weeks ago. Only happened one time and symptoms have resolved.  He notes that there were issues with clearance outpatient and so was sent for fistula study which was normal.  He reports recently having intermittent dark and sticky stools, over the course of months. He has restless legs. He feels nauseated after eating often, has reflux. Denies hematemesis or coffee ground emesis. Did previously take celebrex  however he ran out.   He notes that he has been eating a lot of cashews recently.   He denies chest pain, SOB, palpitations. He does make a small amount of urine.   Physical Exam: Vitals:   12/13/23 1446  BP: 120/76  Pulse:  87  Resp: 18  Temp: 36.7 C (98.1 F)  SpO2: 99%   No intake/output data recorded. No intake or output data in the 24 hours ending 12/13/23 1645  General: well-appearing, no acute distress Heart: RRR, distant heart sounds  Lungs: CTAB, normal wob on RA  Ext: no lower extremity edema Access: LUE AV fistula

## 2023-12-14 NOTE — Procedures (Signed)
 HEMODIALYSIS NURSE PROCEDURE NOTE    Treatment Number:  1 Room / Station:  6   Procedure Date:  12/14/23 Device Name/Number: NATHAN   Total Dialysis Treatment Time:  124 Min.  CONSENT:   Written consent was obtained prior to the procedure and is detailed in the medical record.  Prior to the start of the procedure, a time out was taken and the identity of the patient was confirmed via name, medical record number and date of birth.   WEIGHT:  Date/Time Pre-Treatment Weight (kg) Estimated Dry Weight (kg) Patient Goal Weight (kg) Total Goal Weight (kg)   12/14/23 0035 114.8 kg (253 lb 1.4 oz)  -- * tbd  --  --      Date/Time Post-Treatment Weight (kg) Treatment Weight Change (kg)   12/14/23 0252 111.5 kg (245 lb 13 oz)  -3.31 kg    Active Dialysis Orders (168h ago, onward)     Start     Ordered   12/13/23 1944  Dialysis Schedule Order  (Dialysis ONCE)  Once        12/13/23 1943   12/13/23 1944  Hemodialysis inpatient  (Dialysis ONCE)  Once       Question Answer Comment  Patient HD Status: Chronic   New Start? No   K+ 2 meq/L   Ca++ 2.5 meq/L   Bicarb 35 meq/L   Na+ 137 meq/L   Na+ Modeling no   Dialyzer [id number]   BFR-As tolerated to a maximum of: 400 mL/min   DFR 800 mL/min   Duration of treatment 2 Hr   Dry weight (kg) tbd   Challenge dry weight (kg) no   Fluid removal (L) 0-2L as tolerated by BP   Tubing Adult = 142 ml   Access Site AVF   Access Site Location Left   Keep SBP >: 110      12/13/23 1943            ACCESS SITE:         Arteriovenous Fistula - Vein Graft  Access 06/25/22 0900 Arteriovenous fistula Left Forearm (Active)  Site Assessment Clean;Dry;Intact 12/14/23 0300  AV Fistula Thrill Present;Bruit Present 12/14/23 0300  Status Deaccessed 12/14/23 0300  Dressing Status      Clean;Dry;Intact/not removed 12/14/23 0300  Site Condition No complications 12/14/23 0300  Dressing Gauze;Occlusive 12/14/23 0300     Patient Lines/Drains/Airways Status      Active Peripheral & Central Intravenous Access     Name Placement date Placement time Site Days   Peripheral IV 12/13/23 Right Antecubital 12/13/23  1600  Antecubital  less than 1            LAB RESULTS: Lab Results  Component Value Date   NA 133 (L) 12/14/2023   K 7.3 (HH) 12/14/2023   CL 87 (L) 12/14/2023   CO2 21.0 12/14/2023   BUN 107 (H) 12/14/2023   CREATININE 20.42 (H) 12/14/2023   GLU 87 12/14/2023   CALCIUM  8.2 (L) 12/14/2023   CAION 4.60 10/29/2018   PHOS 16.4 (H) 12/13/2023   MG 2.9 (H) 12/13/2023    Lab Results  Component Value Date   WBC 4.2 12/13/2023   HGB 11.3 (L) 12/13/2023   HCT 33.5 (L) 12/13/2023   PLT 159 12/13/2023   PHART 7.39 10/29/2018   PO2ART 77.8 (L) 10/29/2018   PCO2ART 62.0 (H) 10/29/2018   HCO3ART 37 (H) 10/29/2018   BEART 11.4 (H) 10/29/2018   O2SATART 94.9 10/29/2018   APTT 31.3 09/17/2023  VITAL SIGNS:   Date/Time Temp Temp src     12/14/23 0300 36.6 C (97.9 F)  Oral     12/14/23 0039 36.8 C (98.2 F)  Oral      Date/Time Pulse BP MAP (mmHg) Patient Position   12/14/23 0300 90  130/80  --  Sitting    12/14/23 0252 79  135/72  --  --    12/14/23 0230 74  127/60  --  --    12/14/23 0200 87  142/74  --  --    12/14/23 0130 76  148/47  --  --    12/14/23 0100 75  135/78  --  --    12/14/23 0048 72  116/75  --  --    12/14/23 0039 68  126/80  --  Sitting    12/13/23 2330 73  139/89  103  --    12/13/23 2259 --  144/101  --  Sitting    12/13/23 2235 80  --  148  --     Date/Time Blood Volume Change (%) HCT HGB Critline O2 SAT %   12/14/23 0230 -9.6 %  35.8  12.2  93.7    12/14/23 0200 -7.1 %  34.7  11.8  91.2    12/14/23 0130 -6.3 %  34.5  11.8  91.7     Date/Time Resp SpO2 O2 Device O2 Flow Rate (L/min)   12/14/23 0300 16  --  None (Room air)  --   12/14/23 0252 16  --  --  --   12/14/23 0230 16  --  --  --   12/14/23 0200 16  --  --  --   12/14/23 0130 16  --  --  --   12/14/23 0100 16  --  --  --    12/14/23 0048 16  --  --  --   12/14/23 0039 18  --  None (Room air)  --     Date/Time Therapy Number Dialyzer Hemodialysis Line Type All Machine Alarms Passed   12/14/23 0035 1  F-200 (112 mLs)  Adult (142 m/s)  Yes     Date/Time Air Detector Saline Line Double Clampled Hemo-Safe Applied Dialysis Flow (mL/min)   12/14/23 0035 Engaged  --  --  800 mL/min     Date/Time Verify Priming Solution Priming Volume Hemodialysis Independent pH Hemodialysis Machine Conductivity (mS/cm)   12/14/23 0035 0.9% NS  300 mL  --  13.6 mS/cm     Date/Time Hemodialysis Independent Conductivity (mS/cm) Bicarb Conductivity Residual Bleach Negative Total Chlorine   12/14/23 0035 13.5 mS/cm  -- Yes  0     Date/Time Pre-Hemodialysis Comments   12/14/23 0035 pt a/o x4 arrived on wheelchair     Date/Time Blood Flow Rate (mL/min) Arterial Pressure (mmHg) Venous Pressure (mmHg) Transmembrane Pressure (mmHg)   12/14/23 0252 200 mL/min  --  --  --    12/14/23 0230 350 mL/min  -210 mmHg  150 mmHg  80 mmHg    12/14/23 0200 350 mL/min  -220 mmHg  160 mmHg  60 mmHg    12/14/23 0130 350 mL/min  -250 mmHg  200 mmHg  80 mmHg    12/14/23 0100 400 mL/min  -220 mmHg  150 mmHg  70 mmHg    12/14/23 0048 150 mL/min  -60 mmHg  10 mmHg  60 mmHg     Date/Time Ultrafiltration Rate (mL/hr) Ultrafiltrate Removed (mL) Dialysate Flow Rate (mL/min) KECN Marna)   12/14/23 0252 --  2050 mL  --  --    12/14/23 0230 1070 mL/hr  1690 mL  800 ml/min  --    12/14/23 0200 1070 mL/hr  1100 mL  800 ml/min  --    12/14/23 0130 1070 mL/hr  650 mL  800 ml/min  --    12/14/23 0100 780 mL/hr  200 mL  800 ml/min  --    12/14/23 0048 780 mL/hr  0 mL  800 ml/min  --     Date/Time Intra-Hemodialysis Comments   12/14/23 0252 rinsed back    12/14/23 0230 vss    12/14/23 0200 pt asleep    12/14/23 0130 BFR decreased to 350 due to high arterial pressure    12/14/23 0100 vss    12/14/23 0048 tx started on left arm fistula without issue    12/14/23  0039 pre HD vs     Date/Time Rinseback Volume (mL) On Line Clearance: spKt/V Total Liters Processed (L/min) Dialyzer Clearance   12/14/23 0252 300 mL  --  41.3 L/min  Lightly streaked     Date/Time Post-Hemodialysis Comments   12/14/23 0252 vss, no complaints     Date/Time Total Hemodialysis Replacement Volume (mL) Total Ultrafiltrate Output (mL)   12/14/23 0252 --  1500 mL     12-A-12-A - Medicaitons Given During Treatment  (last 3 hrs)        ** No medications to display **        Patient tolerated treatment in a  Dialysis Recliner. *Some images could not be shown.

## 2023-12-14 NOTE — Care Plan (Signed)
 2 hour HD treatment with 0-2 L UF goal as tolerated per MD order.   Problem: Hemodialysis Goal: Safe, Effective Therapy Delivery Outcome: Progressing   Problem: Hemodialysis Goal: Effective Tissue Perfusion Outcome: Progressing   Problem: Hemodialysis Goal: Absence of Infection Signs and Symptoms Outcome: Progressing

## 2023-12-14 NOTE — ED Notes (Signed)
 Bed: HALL-02A  Expected date:   Expected time:   Means of arrival:   Comments:

## 2023-12-15 ENCOUNTER — Ambulatory Visit: Admitting: Nurse Practitioner

## 2023-12-26 ENCOUNTER — Other Ambulatory Visit: Payer: Self-pay | Admitting: Nurse Practitioner

## 2023-12-26 DIAGNOSIS — I152 Hypertension secondary to endocrine disorders: Secondary | ICD-10-CM

## 2023-12-31 NOTE — Telephone Encounter (Signed)
 Phone call to patient regarding use of GLP-1 medication.  Patient confirmed that they are actively taking Tirzepatide  (Mounjaro , Zepbound ) or have taken a GLP-1 medication in the last 30 days.  Patient advised that beginning at 9 am the day before surgery they should begin a clear liquid diet consisting of water, apple or white grape juice, sodas, sports drinks (except red/purple), or black coffee.  Patient aware that they may have these liquids until 2 hours prior to the arrival time.  Educated to:  avoid broths, jello, and dairy clear liquids are encouraged until 2 hours prior to arrival time continue taking their GLP-1 medication as prescribed  Patient was educated that this is to reduce the chance of a preventable complication and increase safety surrounding perioperative use of these medications.  All questions were answered and patient verbalized understanding.   Mychart message sent with instructions.

## 2024-01-01 ENCOUNTER — Other Ambulatory Visit
Admission: RE | Admit: 2024-01-01 | Discharge: 2024-01-01 | Disposition: A | Discharge status: Home / Self Care | Payer: Self-pay | Source: Ambulatory Visit | Attending: Nephrology | Admitting: Nephrology

## 2024-01-01 DIAGNOSIS — E875 Hyperkalemia: Secondary | ICD-10-CM | POA: Insufficient documentation

## 2024-01-01 LAB — POTASSIUM: Potassium: 5.1 mmol/L (ref 3.5–5.1)

## 2024-01-19 ENCOUNTER — Encounter: Payer: Self-pay | Admitting: Nurse Practitioner

## 2024-01-19 ENCOUNTER — Ambulatory Visit (INDEPENDENT_AMBULATORY_CARE_PROVIDER_SITE_OTHER): Admitting: Nurse Practitioner

## 2024-01-19 VITALS — BP 124/82 | HR 102 | Temp 98.2°F | Resp 16 | Ht 69.0 in | Wt 234.2 lb

## 2024-01-19 DIAGNOSIS — M1711 Unilateral primary osteoarthritis, right knee: Secondary | ICD-10-CM | POA: Diagnosis not present

## 2024-01-19 DIAGNOSIS — G2581 Restless legs syndrome: Secondary | ICD-10-CM

## 2024-01-19 DIAGNOSIS — M25552 Pain in left hip: Secondary | ICD-10-CM

## 2024-01-19 DIAGNOSIS — S8012XA Contusion of left lower leg, initial encounter: Secondary | ICD-10-CM | POA: Diagnosis not present

## 2024-01-19 MED ORDER — ROPINIROLE HCL 1 MG PO TABS: 1.0000 mg | ORAL_TABLET | Freq: Three times a day (TID) | ORAL | 5 refills | Status: AC

## 2024-01-19 MED ORDER — PREDNISONE 10 MG (21) PO TBPK: ORAL_TABLET | ORAL | 0 refills | Status: DC

## 2024-01-19 MED ORDER — CELECOXIB 100 MG PO CAPS: 100.0000 mg | ORAL_CAPSULE | Freq: Two times a day (BID) | ORAL | 3 refills | Status: AC

## 2024-01-19 NOTE — Progress Notes (Signed)
 [facility] [address] [place], [place] [address]  Internal MEDICINE  Office Visit Note  Patient Name: [name] Walker  [id number]  [id number]  Date of Service: 01/19/2024  Chief Complaint  Patient presents with   Acute Visit    Injured left shin-2 weeks ago hit bed and now has a bump on it.      HPI [name] presents for an acute sick visit for bump on left shin --onset of bump on left shin was about 2 weeks ago, there is now a bump and it is most likely a hematoma.  Also having left hip pain that has been bothering him and it has been causing some left sided sciatica.  Patient stopped taking carvedilol, sertraline and losartan  per nephrology.   Current Medication:  Outpatient Encounter Medications as of 01/19/2024  Medication Sig Note   predniSONE  (STERAPRED UNI-PAK 21 TAB) 10 MG (21) TBPK tablet Use as directed for 6 days    acetaminophen  (TYLENOL ) 500 MG tablet Take 500 mg by mouth every 6 (six) hours as needed.    B Complex-C-Folic Acid (RENA-VITE RX) 1 MG TABS Take 1 tablet by mouth daily.    celecoxib  (CELEBREX ) 100 MG capsule Take 1 capsule (100 mg total) by mouth 2 (two) times daily.    cholecalciferol (VITAMIN D3) 25 MCG (1000 UNIT) tablet Take 1,000 Units by mouth daily.    cyanocobalamin (VITAMIN B12) 1000 MCG tablet Take 1,000 mcg by mouth 4 (four) times a week. Takes on non - dialysis days    gabapentin  (NEURONTIN ) 100 MG capsule Take 100 mg by mouth at bedtime.    hydrOXYzine  (ATARAX ) 25 MG tablet TAKE 1 TABLET (25 MG TOTAL) BY MOUTH 2 (TWO) TIMES DAILY AS NEEDED (ANXIETY/SLEEP).    lidocaine -prilocaine  (EMLA ) cream Apply a quarter size amount of cream to the the access site of the left AV fistula 1.5 hours before dialysis appointment on Mondays, Wednesday and Fridays    NON FORMULARY CPAP everynight 12/30/2017: PRN   rOPINIRole  (REQUIP ) 1 MG tablet Take 1 tablet (1 mg total) by mouth 3 (three) times daily.    sevelamer carbonate (RENVELA) 800 MG tablet Take  1,600 mg by mouth 3 (three) times daily.    tirzepatide  (MOUNJARO ) 7.5 MG/0.5ML Pen Inject 7.5 mg into the skin once a week.    torsemide (DEMADEX) 20 MG tablet Take 20 mg by mouth. Tues, Thursday, Saturday,Sunday    [DISCONTINUED] carvedilol (COREG) 25 MG tablet Take 25 mg by mouth 2 (two) times daily with a meal. 07/14/2023: Stopped taking this medication due to BP being too low.    [DISCONTINUED] celecoxib  (CELEBREX ) 100 MG capsule Take 1 capsule (100 mg total) by mouth 2 (two) times daily.    [DISCONTINUED] losartan  (COZAAR ) 50 MG tablet TAKE 1 TABLET BY MOUTH EVERY DAY    [DISCONTINUED] rOPINIRole  (REQUIP ) 1 MG tablet Take 1 tablet (1 mg total) by mouth 3 (three) times daily.    [DISCONTINUED] sertraline (ZOLOFT) 50 MG tablet Take 50 mg by mouth daily.    [DISCONTINUED] sildenafil  (VIAGRA ) 50 MG tablet Take 1 tablet (50 mg total) by mouth daily as needed for erectile dysfunction.    [DISCONTINUED] tadalafil (CIALIS) 10 MG tablet Take 1 tablet (10 mg total) by mouth daily as needed for erectile dysfunction.    No facility-administered encounter medications on file as of 01/19/2024.      Medical History: Past Medical History:  Diagnosis Date   Anxiety    Chronic kidney disease  ESRD   Diabetes mellitus without complication (HCC)    hgb A!C 6.0   Family history of adverse reaction to anesthesia    mother states that she was able to feel everything during a procedure   Hypertension    Neuromuscular disorder (HCC)    Pulmonary embolism (HCC)    Sleep apnea      Vital Signs: BP 124/82   Pulse (!) 102   Temp 98.2 F (36.8 C)   Resp 16   Ht 5' 9 (1.753 m)   Wt 234 lb 3.2 oz (106.2 kg)   SpO2 94%   BMI 34.59 kg/m    Review of Systems  Constitutional:  Positive for fatigue. Negative for chills and unexpected weight change.  HENT:  Negative for congestion, postnasal drip, rhinorrhea, sneezing and sore throat.   Eyes:  Negative for redness.  Respiratory: Negative.   Negative for cough, chest tightness, shortness of breath and wheezing.   Cardiovascular: Negative.  Negative for chest pain and palpitations.  Gastrointestinal:  Negative for abdominal pain, constipation, diarrhea, nausea and vomiting.  Genitourinary:  Negative for dysuria and frequency.  Musculoskeletal:  Positive for arthralgias. Negative for back pain, joint swelling and neck pain.  Skin:  Negative for rash.  Neurological: Negative.  Negative for tremors and numbness.  Hematological:  Negative for adenopathy. Does not bruise/bleed easily.  Psychiatric/Behavioral:  Positive for sleep disturbance. Negative for behavioral problems (Depression), self-injury and suicidal ideas. The patient is nervous/anxious.     Physical Exam Vitals reviewed.  Constitutional:      General: He is not in acute distress.    Appearance: Normal appearance. He is obese. He is not ill-appearing.  HENT:     Head: Normocephalic and atraumatic.  Eyes:     Pupils: Pupils are equal, round, and reactive to light.  Cardiovascular:     Rate and Rhythm: Normal rate and regular rhythm.  Pulmonary:     Effort: Pulmonary effort is normal. No respiratory distress.  Neurological:     Mental Status: He is alert and oriented to person, place, and time.  Psychiatric:        Mood and Affect: Mood normal.        Behavior: Behavior normal.       Assessment/Plan: 1. Pain of left hip (Primary) Take prednisone  taper as prescribed until gone.  - predniSONE  (STERAPRED UNI-PAK 21 TAB) 10 MG (21) TBPK tablet; Use as directed for 6 days  Dispense: 21 tablet; Refill: 0  2. Hematoma of left lower leg Information given to patient. Instructed patient to apply warm compresses and gentle massage to the area. This may take a couple of months to completely resolve.   3. Arthritis of right knee Continue celebrex  as prescribed.  - celecoxib  (CELEBREX ) 100 MG capsule; Take 1 capsule (100 mg total) by mouth 2 (two) times daily.   Dispense: 60 capsule; Refill: 3  4. Restless legs Continue ropinirole  as prescribed.  - rOPINIRole  (REQUIP ) 1 MG tablet; Take 1 tablet (1 mg total) by mouth 3 (three) times daily.  Dispense: 90 tablet; Refill: 5   General Counseling: [name] verbalizes understanding of the findings of todays visit and agrees with plan of treatment. I have discussed any further diagnostic evaluation that may be needed or ordered today. We also reviewed his medications today. he has been encouraged to call the office with any questions or concerns that should arise related to todays visit.    Counseling:    No orders of the  defined types were placed in this encounter.   Meds ordered this encounter  Medications   predniSONE  (STERAPRED UNI-PAK 21 TAB) 10 MG (21) TBPK tablet    Sig: Use as directed for 6 days    Dispense:  21 tablet    Refill:  0   celecoxib  (CELEBREX ) 100 MG capsule    Sig: Take 1 capsule (100 mg total) by mouth 2 (two) times daily.    Dispense:  60 capsule    Refill:  3    Fill new script today   rOPINIRole  (REQUIP ) 1 MG tablet    Sig: Take 1 tablet (1 mg total) by mouth 3 (three) times daily.    Dispense:  90 tablet    Refill:  5    For future refils    Return if symptoms worsen or fail to improve.  [place] Controlled Substance Database was reviewed by me for overdose risk score (ORS)  Time spent:30 Minutes Time spent with patient included reviewing progress notes, labs, imaging studies, and discussing plan for follow up.   This patient was seen by Mardy Maxin, FNP-C in collaboration with Dr. Sigrid Bathe as a part of collaborative care agreement.  Tunya Held R. Maxin, MSN, FNP-C Internal Medicine

## 2024-01-28 ENCOUNTER — Other Ambulatory Visit: Payer: Self-pay | Admitting: Nurse Practitioner

## 2024-01-29 ENCOUNTER — Other Ambulatory Visit: Payer: Self-pay

## 2024-02-01 ENCOUNTER — Telehealth: Payer: Self-pay

## 2024-02-02 MED ORDER — TIRZEPATIDE 10 MG/0.5ML ~~LOC~~ SOAJ: 10.0000 mg | SUBCUTANEOUS | 3 refills | Status: AC

## 2024-02-02 NOTE — Telephone Encounter (Signed)
 Patient notified

## 2024-02-10 ENCOUNTER — Emergency Department

## 2024-02-10 ENCOUNTER — Other Ambulatory Visit: Payer: Self-pay

## 2024-02-10 ENCOUNTER — Encounter: Payer: Self-pay | Admitting: Emergency Medicine

## 2024-02-10 ENCOUNTER — Emergency Department
Admission: EM | Admit: 2024-02-10 | Discharge: 2024-02-11 | Disposition: A | Discharge status: Home / Self Care | Attending: Emergency Medicine | Admitting: Emergency Medicine

## 2024-02-10 DIAGNOSIS — I959 Hypotension, unspecified: Secondary | ICD-10-CM | POA: Insufficient documentation

## 2024-02-10 DIAGNOSIS — I120 Hypertensive chronic kidney disease with stage 5 chronic kidney disease or end stage renal disease: Secondary | ICD-10-CM | POA: Diagnosis not present

## 2024-02-10 DIAGNOSIS — Z992 Dependence on renal dialysis: Secondary | ICD-10-CM | POA: Diagnosis not present

## 2024-02-10 DIAGNOSIS — E1122 Type 2 diabetes mellitus with diabetic chronic kidney disease: Secondary | ICD-10-CM | POA: Insufficient documentation

## 2024-02-10 DIAGNOSIS — N186 End stage renal disease: Secondary | ICD-10-CM | POA: Insufficient documentation

## 2024-02-10 DIAGNOSIS — R55 Syncope and collapse: Secondary | ICD-10-CM | POA: Diagnosis present

## 2024-02-10 LAB — CBG MONITORING, ED: Glucose-Capillary: 122 mg/dL — ABNORMAL HIGH (ref 70–99)

## 2024-02-10 LAB — COMPREHENSIVE METABOLIC PANEL WITH GFR
ALT: 5 U/L (ref 0–44)
AST: 22 U/L (ref 15–41)
Albumin: 3.3 g/dL — ABNORMAL LOW (ref 3.5–5.0)
Alkaline Phosphatase: 39 U/L (ref 38–126)
Anion gap: 15 (ref 5–15)
BUN: 39 mg/dL — ABNORMAL HIGH (ref 6–20)
CO2: 24 mmol/L (ref 22–32)
Calcium: 7.9 mg/dL — ABNORMAL LOW (ref 8.9–10.3)
Chloride: 101 mmol/L (ref 98–111)
Creatinine, Ser: 10.21 mg/dL — ABNORMAL HIGH (ref 0.61–1.24)
GFR, Estimated: 6 mL/min — ABNORMAL LOW (ref >60)
Glucose, Bld: 141 mg/dL — ABNORMAL HIGH (ref 70–99)
Potassium: 5.1 mmol/L (ref 3.5–5.1)
Sodium: 140 mmol/L (ref 135–145)
Total Bilirubin: 1.2 mg/dL (ref 0.0–1.2)
Total Protein: 6.9 g/dL (ref 6.5–8.1)

## 2024-02-10 LAB — CBC
HCT: 37 % — ABNORMAL LOW (ref 39.0–52.0)
Hemoglobin: 11.6 g/dL — ABNORMAL LOW (ref 13.0–17.0)
MCH: 29.6 pg (ref 26.0–34.0)
MCHC: 31.4 g/dL (ref 30.0–36.0)
MCV: 94.4 fL (ref 80.0–100.0)
Platelets: 134 K/uL — ABNORMAL LOW (ref 150–400)
RBC: 3.92 MIL/uL — ABNORMAL LOW (ref 4.22–5.81)
RDW: 16.7 % — ABNORMAL HIGH (ref 11.5–15.5)
WBC: 4.2 K/uL (ref 4.0–10.5)
nRBC: 0 % (ref 0.0–0.2)

## 2024-02-10 LAB — TROPONIN I (HIGH SENSITIVITY): Troponin I (High Sensitivity): 12 ng/L (ref <18)

## 2024-02-10 MED ORDER — SODIUM CHLORIDE 0.9 % IV BOLUS
500.0000 mL | Freq: Once | INTRAVENOUS | Status: AC
  Administered 2024-02-10: 500 mL via INTRAVENOUS

## 2024-02-10 NOTE — ED Triage Notes (Signed)
 Pt arrived via [facility] from home where family reported pt was in and out of consciousness while sitting on the bed. Pt had dialysis today and pt sts he was normal until around 9pm when he became dizzy. Initial BP 85/50 and oxygen room air was 90%. Pts arrival to ED, drowsy but able to answer questions appropriately and follow commands.    **Per pts wife, pt is normally a heavy alcohol user but has recently stopped drinking x3 weeks in preporation for hernia surgery. Wife sts that since he has stopped drinking that pt has intermittent episodes where he just blanks out.

## 2024-02-10 NOTE — ED Provider Notes (Signed)
 [facility] Provider Note    Event Date/Time   First MD Initiated Contact with Patient 02/10/24 2155     (approximate)  History   Chief Complaint: Loss of Consciousness  HPI  [name] Walker is a 48 y.o. male with a past medical history of anxiety, diabetes, hypertension, end-stage renal disease on hemodialysis Monday, Wednesday, Friday, presents to the emergency department for generalized weakness and near syncope.  According to the patient he had his dialysis session this morning, states he had been feeling well all day even after dialysis however this evening around 9 PM he began feeling very weak and lightheaded.  States he tried to walk down the hallway to tell his family member but had to stop because he felt like he was going to pass out.  Patient was able to sit down and thinks he may have briefly passed out or almost passed out.  Denies hitting his head.  I called EMS due to the patient's weakness.  EMS found the patient to be hypotensive around 80 systolic and brought the patient to the emergency department.  Here the patient is awake alert oriented.  Answering all questions appropriately.  Patient is hypotensive currently 82/56.  However patient largely is negative review of system denies any chest pain cough or shortness of breath at any point.  No abdominal pain nausea vomiting or diarrhea.  Does create a small amount of urine each day but denies any dysuria.  No fever.  Physical Exam   Triage Vital Signs: ED Triage Vitals  Encounter Vitals Group     BP 02/10/24 2159 (!) 82/56     Girls Systolic BP Percentile --      Girls Diastolic BP Percentile --      Boys Systolic BP Percentile --      Boys Diastolic BP Percentile --      Pulse Rate 02/10/24 2159 100     Resp 02/10/24 2159 20     Temp 02/10/24 2159 98.8 F (37.1 C)     Temp Source 02/10/24 2159 Oral     SpO2 02/10/24 2159 98 %     Weight 02/10/24 2154 234 lb 2.1 oz (106.2 kg)     Height 02/10/24  2154 5' 9 (1.753 m)     Head Circumference --      Peak Flow --      Pain Score 02/10/24 2154 0     Pain Loc --      Pain Education --      Exclude from Growth Chart --     Most recent vital signs: Vitals:   02/10/24 2159  BP: (!) 82/56  Pulse: 100  Resp: 20  Temp: 98.8 F (37.1 C)  SpO2: 98%    General: Awake, no distress.  Answering questions appropriately. CV:  Good peripheral perfusion.  Regular rate and rhythm  Resp:  Normal effort.  Equal breath sounds bilaterally.  Abd:  No distention.  Soft, nontender.  No rebound or guarding.   ED Results / Procedures / Treatments   EKG  EKG viewed and interpreted by myself shows a normal sinus rhythm at 98 bpm with a narrow QRS, normal axis, normal intervals, nonspecific ST changes without ST elevation.  RADIOLOGY  I have reviewed interpreted the chest x-ray images.  No obvious consolidation on my evaluation. Radiology is read the x-ray is cardiomegaly without active disease.   MEDICATIONS ORDERED IN ED: Medications  sodium chloride  0.9 % bolus 500 mL (  has no administration in time range)     IMPRESSION / MDM / ASSESSMENT AND PLAN / ED COURSE  I reviewed the triage vital signs and the nursing notes.  Patient's presentation is most consistent with acute presentation with potential threat to life or bodily function.  Patient presents emergency department for hypotension and near syncope.  Patient is end-stage renal on dialysis had a full session this morning but states he had been feeling well all day after dialysis until this evening.  Largely negative review of systems otherwise with no complaints besides generalized fatigue/weakness sensation.  Patient currently hypotensive 82/56 but all other vitals are largely within normal limits.  Will IV hydrate 500 cc of normal saline.  Will check labs including CBC chemistry troponin x 2.  Will obtain a portable chest x-ray urinalysis if patient can produce a urine sample and  continue to closely monitor the patient.  Patient agreeable to plan of care.  Patient's lab work shows reassuring CBC with a normal white blood cell count, overall reassuring chemistry for dialysis patient.  Troponin is negative.  Chest x-ray clear.  Blood pressure currently up to 94/60 5:04 100 cc of fluid.  I discussed with the patient given his hypotension recommended admission to the hospital.  Patient strongly wishes to go home if possible.  We will dose an additional 500 cc of fluid.  If the patient's blood pressure improves and his repeat troponin remains negative we could potentially discharge the patient home with follow-up with his PCP tomorrow.  If his pressure does not improve or his troponin elevates patient would need to be admitted.  Patient agreeable to this plan.  Patient states he is feeling better he is asking for something to eat and drink.  FINAL CLINICAL IMPRESSION(S) / ED DIAGNOSES   Weakness Hypotension   Note:  This document was prepared using Dragon voice recognition software and may include unintentional dictation errors.   Dorothyann Drivers, MD 02/10/24 2321

## 2024-02-11 LAB — RESP PANEL BY RT-PCR (RSV, FLU A&B, COVID)  RVPGX2
Influenza A by PCR: NEGATIVE
Influenza B by PCR: NEGATIVE
Resp Syncytial Virus by PCR: NEGATIVE
SARS Coronavirus 2 by RT PCR: NEGATIVE

## 2024-02-11 LAB — TROPONIN I (HIGH SENSITIVITY): Troponin I (High Sensitivity): 14 ng/L (ref <18)

## 2024-02-11 NOTE — Discharge Instructions (Addendum)
 Return to the ER if you develop worsening symptoms feeling syncope, lightheadedness again or any other concerns.  This could be from dehydration you should let your dialysis team know that this happen and maybe they can pull off less fluid.  You should also follow-up for recheck with your primary care doctor tomorrow of your blood pressure return the ER if you develop worsening symptoms or any other concerns
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
## 2024-02-11 NOTE — ED Notes (Signed)
 Patient ambulated with steady gait. Feels much better than arrival. No dizziness with standing or ambulating.

## 2024-03-15 ENCOUNTER — Ambulatory Visit: Admitting: Nurse Practitioner

## 2024-04-12 ENCOUNTER — Emergency Department

## 2024-04-12 ENCOUNTER — Other Ambulatory Visit: Payer: Self-pay

## 2024-04-12 ENCOUNTER — Emergency Department
Admission: EM | Admit: 2024-04-12 | Discharge: 2024-04-12 | Disposition: A | Discharge status: Home / Self Care | Attending: Emergency Medicine | Admitting: Emergency Medicine

## 2024-04-12 DIAGNOSIS — M545 Low back pain, unspecified: Secondary | ICD-10-CM | POA: Diagnosis not present

## 2024-04-12 DIAGNOSIS — N186 End stage renal disease: Secondary | ICD-10-CM | POA: Diagnosis not present

## 2024-04-12 DIAGNOSIS — S161XXA Strain of muscle, fascia and tendon at neck level, initial encounter: Secondary | ICD-10-CM | POA: Diagnosis not present

## 2024-04-12 DIAGNOSIS — S199XXA Unspecified injury of neck, initial encounter: Secondary | ICD-10-CM | POA: Diagnosis present

## 2024-04-12 DIAGNOSIS — I6523 Occlusion and stenosis of bilateral carotid arteries: Secondary | ICD-10-CM

## 2024-04-12 DIAGNOSIS — Y92410 Unspecified street and highway as the place of occurrence of the external cause: Secondary | ICD-10-CM | POA: Diagnosis not present

## 2024-04-12 DIAGNOSIS — Z992 Dependence on renal dialysis: Secondary | ICD-10-CM | POA: Diagnosis not present

## 2024-04-12 DIAGNOSIS — M25552 Pain in left hip: Secondary | ICD-10-CM | POA: Diagnosis not present

## 2024-04-12 MED ORDER — OXYCODONE-ACETAMINOPHEN 5-325 MG PO TABS
1.0000 | ORAL_TABLET | Freq: Once | ORAL | Status: AC
  Administered 2024-04-12: 18:00:00 1 via ORAL
  Filled 2024-04-12: qty 1

## 2024-04-12 MED ORDER — CYCLOBENZAPRINE HCL 10 MG PO TABS: 10.0000 mg | ORAL_TABLET | Freq: Three times a day (TID) | ORAL | 0 refills | Status: DC | PRN

## 2024-04-12 MED ORDER — LIDOCAINE 5 % EX PTCH
2.0000 | MEDICATED_PATCH | CUTANEOUS | Status: DC
  Administered 2024-04-12: 20:00:00 2 via TRANSDERMAL
  Filled 2024-04-12: qty 2
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
## 2024-04-12 NOTE — ED Notes (Signed)
 AVS provided by edp was reviewed with the pt. Pt verbalized understanding with no additional questions at this time. Pt verified pharmacy. Pt called family for ride home
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
## 2024-04-19 ENCOUNTER — Encounter: Payer: Self-pay | Admitting: Nurse Practitioner

## 2024-04-19 ENCOUNTER — Ambulatory Visit: Admitting: Nurse Practitioner

## 2024-04-19 VITALS — BP 130/74 | HR 108 | Temp 96.4°F | Resp 16 | Ht 69.0 in | Wt 242.6 lb

## 2024-04-19 DIAGNOSIS — M545 Low back pain, unspecified: Secondary | ICD-10-CM

## 2024-04-19 DIAGNOSIS — F0781 Postconcussional syndrome: Secondary | ICD-10-CM

## 2024-04-19 DIAGNOSIS — G8929 Other chronic pain: Secondary | ICD-10-CM

## 2024-04-19 DIAGNOSIS — M546 Pain in thoracic spine: Secondary | ICD-10-CM | POA: Diagnosis not present

## 2024-04-19 DIAGNOSIS — R937 Abnormal findings on diagnostic imaging of other parts of musculoskeletal system: Secondary | ICD-10-CM

## 2024-04-19 DIAGNOSIS — M25561 Pain in right knee: Secondary | ICD-10-CM | POA: Diagnosis not present

## 2024-04-19 DIAGNOSIS — M542 Cervicalgia: Secondary | ICD-10-CM | POA: Diagnosis not present

## 2024-04-19 MED ORDER — BUTALBITAL-APAP-CAFFEINE 50-325-40 MG PO TABS: 1.0000 | ORAL_TABLET | Freq: Four times a day (QID) | ORAL | 1 refills | Status: AC | PRN

## 2024-04-19 NOTE — Progress Notes (Signed)
 [facility] [address] [place], [place] [address]  Internal MEDICINE  Office Visit Note  Patient Name: [name] Walker  [id number]  [id number]  Date of Service: 04/19/2024  Chief Complaint  Patient presents with   Acute Visit    Back pain     HPI [name] Walker presents for an acute sick visit for chronic pain of multiple joints, acute knee pain and headaches.   Chronic low back pain -- continues to be an issue. The pain remains persistent and has continued to worsen. He needs further evaluation  Chronic neck pain -- pain is persistent and continues to worsen.  Chronic mid-back pain -- pain is persistent and continued to worsen Acute right knee pain -- right knee pain has been hurting more recently, reports that he thinks he twisted it at some point in the past few weeks.  Postconcussion syndrome -- having headaches, nausea, dizziness, irritability.     Current Medication:  Outpatient Encounter Medications as of 04/19/2024  Medication Sig Note   butalbital -acetaminophen -caffeine  (FIORICET) 50-325-40 MG tablet Take 1 tablet by mouth every 6 (six) hours as needed for headache.    acetaminophen  (TYLENOL ) 500 MG tablet Take 500 mg by mouth every 6 (six) hours as needed.    B Complex-C-Folic Acid (RENA-VITE RX) 1 MG TABS Take 1 tablet by mouth daily.    celecoxib  (CELEBREX ) 100 MG capsule Take 1 capsule (100 mg total) by mouth 2 (two) times daily.    cyanocobalamin (VITAMIN B12) 1000 MCG tablet Take 1,000 mcg by mouth 4 (four) times a week. Takes on non - dialysis days    cyclobenzaprine  (FLEXERIL ) 10 MG tablet Take 1 tablet (10 mg total) by mouth 3 (three) times daily as needed for muscle spasms. (Patient not taking: Reported on 04/19/2024)    gabapentin  (NEURONTIN ) 100 MG capsule Take 100 mg by mouth at bedtime.    hydrOXYzine  (ATARAX ) 25 MG tablet TAKE 1 TABLET (25 MG TOTAL) BY MOUTH 2 (TWO) TIMES DAILY AS NEEDED (ANXIETY/SLEEP).    lidocaine -prilocaine  (EMLA ) cream Apply a  quarter size amount of cream to the the access site of the left AV fistula 1.5 hours before dialysis appointment on Mondays, Wednesday and Fridays    NON FORMULARY CPAP everynight 12/30/2017: PRN   predniSONE  (STERAPRED UNI-PAK 21 TAB) 10 MG (21) TBPK tablet Use as directed for 6 days    rOPINIRole  (REQUIP ) 1 MG tablet Take 1 tablet (1 mg total) by mouth 3 (three) times daily.    sevelamer carbonate (RENVELA) 800 MG tablet Take 1,600 mg by mouth 3 (three) times daily. (Patient not taking: Reported on 04/19/2024)    tirzepatide  (MOUNJARO ) 10 MG/0.5ML Pen Inject 10 mg into the skin once a week.    torsemide (DEMADEX) 20 MG tablet Take 20 mg by mouth. Tues, Thursday, Saturday,Sunday    Vitamin D, Ergocalciferol, (DRISDOL) 1.25 MG (50000 UNIT) CAPS capsule TAKE 1 CAPSULE BY MOUTH ONCE A WEEK    [DISCONTINUED] sildenafil  (VIAGRA ) 50 MG tablet Take 1 tablet (50 mg total) by mouth daily as needed for erectile dysfunction.    [DISCONTINUED] tadalafil (CIALIS) 10 MG tablet Take 1 tablet (10 mg total) by mouth daily as needed for erectile dysfunction.    No facility-administered encounter medications on file as of 04/19/2024.      Medical History: Past Medical History:  Diagnosis Date   Anxiety    Chronic kidney disease    ESRD   Diabetes mellitus without complication (HCC)    hgb A!C 6.0  Family history of adverse reaction to anesthesia    mother states that she was able to feel everything during a procedure   Hypertension    Neuromuscular disorder (HCC)    Pulmonary embolism (HCC)    Sleep apnea      Vital Signs: BP 130/74   Pulse (!) 108   Temp (!) 96.4 F (35.8 C)   Resp 16   Ht 5' 9 (1.753 m)   Wt 242 lb 9.6 oz (110 kg)   SpO2 94%   BMI 35.83 kg/m    Review of Systems  Constitutional:  Positive for fatigue.  HENT: Negative.    Respiratory: Negative.  Negative for cough, chest tightness, shortness of breath and wheezing.   Cardiovascular: Negative.  Negative for chest pain  and palpitations.  Gastrointestinal:  Positive for nausea.  Musculoskeletal:  Positive for arthralgias, back pain, gait problem, joint swelling, myalgias and neck pain.  Neurological:  Positive for dizziness and headaches.    Physical Exam Vitals reviewed.  Constitutional:      General: He is not in acute distress.    Appearance: Normal appearance. He is obese. He is not ill-appearing.  HENT:     Head: Normocephalic and atraumatic.  Eyes:     Pupils: Pupils are equal, round, and reactive to light.  Cardiovascular:     Rate and Rhythm: Normal rate and regular rhythm.  Pulmonary:     Effort: Pulmonary effort is normal. No respiratory distress.  Neurological:     Mental Status: He is alert and oriented to person, place, and time.  Psychiatric:        Mood and Affect: Mood normal.        Behavior: Behavior normal.       Assessment/Plan: 1. Acute pain of right knee (Primary) Referred to orthopedic surgery  - Ambulatory referral to Orthopedic Surgery  2. Chronic bilateral low back pain without sciatica Referred to orthopedic surgery  - Ambulatory referral to Orthopedic Surgery  3. Chronic neck pain Referred to orthopedic surgery  - Ambulatory referral to Orthopedic Surgery  4. Chronic midline thoracic back pain Referred to orthopedic surgery  - Ambulatory referral to Orthopedic Surgery  5. Abnormal CT of spine Referred to orthopedic surgery  - Ambulatory referral to Orthopedic Surgery  6. Postconcussional syndrome Referred to neurology, and take fioricet as needed.  - Ambulatory referral to Neurology - butalbital -acetaminophen -caffeine  (FIORICET) 50-325-40 MG tablet; Take 1 tablet by mouth every 6 (six) hours as needed for headache.  Dispense: 14 tablet; Refill: 1   General Counseling: [name] Walker verbalizes understanding of the findings of todays visit and agrees with plan of treatment. I have discussed any further diagnostic evaluation that may be needed or ordered today.  We also reviewed his medications today. he has been encouraged to call the office with any questions or concerns that should arise related to todays visit.    Counseling:    Orders Placed This Encounter  Procedures   Ambulatory referral to Neurology   Ambulatory referral to Orthopedic Surgery    Meds ordered this encounter  Medications   butalbital -acetaminophen -caffeine  (FIORICET) 50-325-40 MG tablet    Sig: Take 1 tablet by mouth every 6 (six) hours as needed for headache.    Dispense:  14 tablet    Refill:  1    Fill new script today    Return if symptoms worsen or fail to improve, for pls do a note for work for 2 weeks to be out .  [place]  Controlled Substance Database was reviewed by me for overdose risk score (ORS)  Time spent:30 Minutes Time spent with patient included reviewing progress notes, labs, imaging studies, and discussing plan for follow up.   This patient was seen by Mardy Maxin, FNP-C in collaboration with Dr. Sigrid Bathe as a part of collaborative care agreement.  Gwendola Hornaday R. Maxin, MSN, FNP-C Internal Medicine

## 2024-04-22 ENCOUNTER — Telehealth: Payer: Self-pay | Admitting: Nurse Practitioner

## 2024-04-22 NOTE — Telephone Encounter (Signed)
 Awaiting 04/19/24 office notes for Neurology & Orthopedic referral-Toni

## 2024-04-25 ENCOUNTER — Encounter: Payer: Self-pay | Admitting: Nurse Practitioner

## 2024-04-26 ENCOUNTER — Telehealth: Payer: Self-pay | Admitting: Nurse Practitioner

## 2024-04-26 NOTE — Telephone Encounter (Signed)
 04/19/24 office notes and work note faxed to [employer] [phone]

## 2024-04-27 ENCOUNTER — Telehealth: Payer: Self-pay | Admitting: Nurse Practitioner

## 2024-04-27 NOTE — Telephone Encounter (Signed)
 Orthopedic referral faxed to [facility]; [phone]. Notified patient. Gave pt telephone [phone]  Neurology referral faxed to [facility]; [phone]. Notified patient. Gave pt telephone [phone]

## 2024-05-01 ENCOUNTER — Observation Stay
Admission: EM | Admit: 2024-05-01 | Discharge: 2024-05-02 | Disposition: A | Payer: Self-pay | Attending: Emergency Medicine | Admitting: Emergency Medicine

## 2024-05-01 ENCOUNTER — Emergency Department: Payer: Self-pay

## 2024-05-01 ENCOUNTER — Observation Stay: Admit: 2024-05-01 | Discharge: 2024-05-01 | Disposition: A | Payer: Self-pay | Attending: Hospitalist

## 2024-05-01 ENCOUNTER — Other Ambulatory Visit: Payer: Self-pay

## 2024-05-01 DIAGNOSIS — J9601 Acute respiratory failure with hypoxia: Principal | ICD-10-CM | POA: Insufficient documentation

## 2024-05-01 DIAGNOSIS — I132 Hypertensive heart and chronic kidney disease with heart failure and with stage 5 chronic kidney disease, or end stage renal disease: Secondary | ICD-10-CM | POA: Insufficient documentation

## 2024-05-01 DIAGNOSIS — E1122 Type 2 diabetes mellitus with diabetic chronic kidney disease: Secondary | ICD-10-CM | POA: Insufficient documentation

## 2024-05-01 DIAGNOSIS — R7989 Other specified abnormal findings of blood chemistry: Secondary | ICD-10-CM | POA: Insufficient documentation

## 2024-05-01 DIAGNOSIS — I509 Heart failure, unspecified: Principal | ICD-10-CM | POA: Insufficient documentation

## 2024-05-01 DIAGNOSIS — G4733 Obstructive sleep apnea (adult) (pediatric): Secondary | ICD-10-CM | POA: Diagnosis present

## 2024-05-01 DIAGNOSIS — E785 Hyperlipidemia, unspecified: Secondary | ICD-10-CM | POA: Diagnosis not present

## 2024-05-01 DIAGNOSIS — I10 Essential (primary) hypertension: Secondary | ICD-10-CM | POA: Diagnosis present

## 2024-05-01 DIAGNOSIS — M5416 Radiculopathy, lumbar region: Secondary | ICD-10-CM | POA: Diagnosis present

## 2024-05-01 DIAGNOSIS — F17210 Nicotine dependence, cigarettes, uncomplicated: Secondary | ICD-10-CM | POA: Insufficient documentation

## 2024-05-01 DIAGNOSIS — I5023 Acute on chronic systolic (congestive) heart failure: Secondary | ICD-10-CM

## 2024-05-01 DIAGNOSIS — Z79899 Other long term (current) drug therapy: Secondary | ICD-10-CM | POA: Insufficient documentation

## 2024-05-01 DIAGNOSIS — N186 End stage renal disease: Secondary | ICD-10-CM | POA: Diagnosis not present

## 2024-05-01 DIAGNOSIS — Z992 Dependence on renal dialysis: Secondary | ICD-10-CM | POA: Insufficient documentation

## 2024-05-01 DIAGNOSIS — F419 Anxiety disorder, unspecified: Secondary | ICD-10-CM | POA: Diagnosis not present

## 2024-05-01 DIAGNOSIS — R079 Chest pain, unspecified: Secondary | ICD-10-CM | POA: Diagnosis present

## 2024-05-01 LAB — BASIC METABOLIC PANEL WITH GFR
Anion gap: 24 — ABNORMAL HIGH (ref 5–15)
BUN: 111 mg/dL — ABNORMAL HIGH (ref 6–20)
CO2: 22 mmol/L (ref 22–32)
Calcium: 8.6 mg/dL — ABNORMAL LOW (ref 8.9–10.3)
Chloride: 97 mmol/L — ABNORMAL LOW (ref 98–111)
Creatinine, Ser: 17.6 mg/dL — ABNORMAL HIGH (ref 0.61–1.24)
GFR, Estimated: 3 mL/min — ABNORMAL LOW
Glucose, Bld: 83 mg/dL (ref 70–99)
Potassium: 5.1 mmol/L (ref 3.5–5.1)
Sodium: 143 mmol/L (ref 135–145)

## 2024-05-01 LAB — CREATININE, SERUM
Creatinine, Ser: 17.5 mg/dL — ABNORMAL HIGH (ref 0.61–1.24)
GFR, Estimated: 3 mL/min — ABNORMAL LOW

## 2024-05-01 LAB — CBC
HCT: 31.7 % — ABNORMAL LOW (ref 39.0–52.0)
HCT: 30.1 % — ABNORMAL LOW (ref 39.0–52.0)
Hemoglobin: 10.2 g/dL — ABNORMAL LOW (ref 13.0–17.0)
Hemoglobin: 9.7 g/dL — ABNORMAL LOW (ref 13.0–17.0)
MCH: 30.1 pg (ref 26.0–34.0)
MCH: 30.2 pg (ref 26.0–34.0)
MCHC: 32.2 g/dL (ref 30.0–36.0)
MCHC: 32.2 g/dL (ref 30.0–36.0)
MCV: 93.5 fL (ref 80.0–100.0)
MCV: 93.8 fL (ref 80.0–100.0)
Platelets: 133 K/uL — ABNORMAL LOW (ref 150–400)
Platelets: 130 K/uL — ABNORMAL LOW (ref 150–400)
RBC: 3.39 MIL/uL — ABNORMAL LOW (ref 4.22–5.81)
RBC: 3.21 MIL/uL — ABNORMAL LOW (ref 4.22–5.81)
RDW: 16.8 % — ABNORMAL HIGH (ref 11.5–15.5)
RDW: 16.7 % — ABNORMAL HIGH (ref 11.5–15.5)
WBC: 3.9 K/uL — ABNORMAL LOW (ref 4.0–10.5)
WBC: 4.4 K/uL (ref 4.0–10.5)
nRBC: 0 % (ref 0.0–0.2)
nRBC: 0 % (ref 0.0–0.2)

## 2024-05-01 LAB — HEPATITIS B SURFACE ANTIGEN: Hepatitis B Surface Ag: NONREACTIVE

## 2024-05-01 LAB — TROPONIN T, HIGH SENSITIVITY
Troponin T High Sensitivity: 127 ng/L (ref 0–19)
Troponin T High Sensitivity: 132 ng/L (ref 0–19)

## 2024-05-01 MED ORDER — HEPARIN SODIUM (PORCINE) 5000 UNIT/ML IJ SOLN
5000.0000 [IU] | Freq: Three times a day (TID) | INTRAMUSCULAR | Status: DC
  Administered 2024-05-01 – 2024-05-02 (×4): 5000 [IU] via SUBCUTANEOUS
  Filled 2024-05-01 (×4): qty 1

## 2024-05-01 MED ORDER — BUTALBITAL-APAP-CAFFEINE 50-325-40 MG PO TABS
1.0000 | ORAL_TABLET | Freq: Four times a day (QID) | ORAL | Status: DC | PRN
  Administered 2024-05-01 – 2024-05-02 (×2): 1 via ORAL
  Filled 2024-05-01 (×2): qty 1

## 2024-05-01 MED ORDER — IOHEXOL 350 MG/ML SOLN
75.0000 mL | Freq: Once | INTRAVENOUS | Status: AC | PRN
  Administered 2024-05-01: 75 mL via INTRAVENOUS

## 2024-05-01 MED ORDER — HYDROMORPHONE HCL 2 MG PO TABS
2.0000 mg | ORAL_TABLET | ORAL | Status: AC
  Administered 2024-05-01: 2 mg via ORAL
  Filled 2024-05-01: qty 1

## 2024-05-01 MED ORDER — POLYETHYLENE GLYCOL 3350 17 G PO PACK: 17.0000 g | PACK | Freq: Every day | ORAL | Status: DC | PRN

## 2024-05-01 MED ORDER — PENTAFLUOROPROP-TETRAFLUOROETH EX AERO
1.0000 | INHALATION_SPRAY | CUTANEOUS | Status: DC | PRN
  Administered 2024-05-02: 1 via TOPICAL
  Filled 2024-05-01: qty 30

## 2024-05-01 MED ORDER — ONDANSETRON HCL 4 MG PO TABS
4.0000 mg | ORAL_TABLET | Freq: Four times a day (QID) | ORAL | Status: DC | PRN
  Administered 2024-05-01: 4 mg via ORAL
  Filled 2024-05-01: qty 1

## 2024-05-01 MED ORDER — LIDOCAINE-PRILOCAINE 2.5-2.5 % EX CREA: 1.0000 | TOPICAL_CREAM | CUTANEOUS | Status: DC | PRN

## 2024-05-01 MED ORDER — HEPARIN SODIUM (PORCINE) 1000 UNIT/ML DIALYSIS: 1000.0000 [IU] | INTRAMUSCULAR | Status: DC | PRN

## 2024-05-01 MED ORDER — PENTAFLUOROPROP-TETRAFLUOROETH EX AERO
1.0000 | INHALATION_SPRAY | CUTANEOUS | Status: DC | PRN
  Filled 2024-05-01: qty 30

## 2024-05-01 MED ORDER — ASPIRIN 81 MG PO TBEC
81.0000 mg | DELAYED_RELEASE_TABLET | Freq: Every day | ORAL | Status: DC
  Administered 2024-05-01 – 2024-05-02 (×2): 81 mg via ORAL
  Filled 2024-05-01 (×2): qty 1

## 2024-05-01 MED ORDER — ACETAMINOPHEN 650 MG RE SUPP: 650.0000 mg | Freq: Four times a day (QID) | RECTAL | Status: DC | PRN

## 2024-05-01 MED ORDER — FENTANYL CITRATE (PF) 50 MCG/ML IJ SOSY: 12.5000 ug | PREFILLED_SYRINGE | INTRAMUSCULAR | Status: DC | PRN

## 2024-05-01 MED ORDER — OXYCODONE HCL 5 MG PO TABS
5.0000 mg | ORAL_TABLET | ORAL | Status: DC | PRN
  Administered 2024-05-02: 5 mg via ORAL
  Filled 2024-05-01: qty 1

## 2024-05-01 MED ORDER — ONDANSETRON HCL 4 MG/2ML IJ SOLN
4.0000 mg | Freq: Four times a day (QID) | INTRAMUSCULAR | Status: DC | PRN
  Administered 2024-05-02: 4 mg via INTRAVENOUS

## 2024-05-01 MED ORDER — IPRATROPIUM-ALBUTEROL 0.5-2.5 (3) MG/3ML IN SOLN
3.0000 mL | Freq: Four times a day (QID) | RESPIRATORY_TRACT | Status: DC | PRN
  Administered 2024-05-01: 3 mL via RESPIRATORY_TRACT
  Filled 2024-05-01: qty 3

## 2024-05-01 MED ORDER — PENTAFLUOROPROP-TETRAFLUOROETH EX AERO
INHALATION_SPRAY | CUTANEOUS | Status: AC
  Filled 2024-05-01: qty 30

## 2024-05-01 MED ORDER — CHLORHEXIDINE GLUCONATE CLOTH 2 % EX PADS
6.0000 | MEDICATED_PAD | Freq: Every day | CUTANEOUS | Status: DC
  Administered 2024-05-02: 6 via TOPICAL
  Filled 2024-05-01: qty 6

## 2024-05-01 MED ORDER — ROPINIROLE HCL 1 MG PO TABS
1.0000 mg | ORAL_TABLET | Freq: Three times a day (TID) | ORAL | Status: DC
  Administered 2024-05-01 – 2024-05-02 (×3): 1 mg via ORAL
  Filled 2024-05-01 (×4): qty 1

## 2024-05-01 MED ORDER — FUROSEMIDE 10 MG/ML IJ SOLN
40.0000 mg | Freq: Two times a day (BID) | INTRAMUSCULAR | Status: DC
  Administered 2024-05-01 – 2024-05-02 (×3): 40 mg via INTRAVENOUS
  Filled 2024-05-01 (×3): qty 4

## 2024-05-01 MED ORDER — ACETAMINOPHEN 325 MG PO TABS: 650.0000 mg | ORAL_TABLET | Freq: Four times a day (QID) | ORAL | Status: DC | PRN

## 2024-05-01 MED ORDER — HYDRALAZINE HCL 20 MG/ML IJ SOLN
10.0000 mg | Freq: Four times a day (QID) | INTRAMUSCULAR | Status: DC | PRN
  Administered 2024-05-01: 10 mg via INTRAVENOUS
  Filled 2024-05-01: qty 0.5
  Filled 2024-05-01: qty 1

## 2024-05-01 MED ORDER — GABAPENTIN 100 MG PO CAPS
100.0000 mg | ORAL_CAPSULE | Freq: Every day | ORAL | Status: DC
  Administered 2024-05-01: 100 mg via ORAL
  Filled 2024-05-01: qty 1

## 2024-05-01 NOTE — Progress Notes (Signed)
" °  Echocardiogram 2D Echocardiogram has NOT been performed. Unable to complete the patient's echo today. When I arrived at the patient's bedside in the ER he was getting a room change and once I was able to locate him in the ER, the dialysis RN was about to set him up.   Thedora GORMAN Louder 05/01/2024, 12:23 PM "

## 2024-05-01 NOTE — ED Notes (Signed)
 ED sec helping pt to place special diet order.

## 2024-05-01 NOTE — ED Provider Notes (Signed)
 Admitted to the care treatment of Dr. Granville Dicky Anes, MD 05/01/24 [phone]
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
## 2024-05-01 NOTE — ED Notes (Signed)
 Dialysis calling to get report, stated about an hour to get pt.

## 2024-05-01 NOTE — ED Notes (Signed)
 Dialysis complete. 4.4L removed. Pt states breathing feels a little better. [place] removed.

## 2024-05-01 NOTE — Progress Notes (Addendum)
 Hemodialysis Note:  Received patient in bed. Alert and oriented. Informed consent singed and in chart.  Treatment initiated: 1245 Treatment completed: 1635  Access used: Left AVF Access issues: None  Patient tolerated well, alert and without acute distress. Report given to patient's RN.  Total UF removed: 4.4 liters Medications given: Hydralazine  10 mg IV  Post HD weight: 109.6 Kg  [facility]

## 2024-05-01 NOTE — ED Notes (Signed)
 Dialysis still going. Pt provided crackers per request. Pt asking to order food. Explained that he has diet order. Spoke with ED sec who will help facilitate pt calling kitchen for special tray. Per dialysis RN, meds to be given once dialysis complete.
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
## 2024-05-01 NOTE — ED Triage Notes (Addendum)
 Pt presetns for 6/10 substernal chest pain that woke him from sleep. No pertinent hx. Endorsing SOB. Was found to be 88% on room air. Endorsing recent nasal congestion. Dialysis pt (missed treatment Friday). 324 aspirin  given by EMS

## 2024-05-01 NOTE — ED Notes (Signed)
 Pt c/o worsening SOB, lungs auscultated, lung sounds still diminished but not worse than earlier. Pt's SOB less than initially seen.

## 2024-05-01 NOTE — ED Notes (Signed)
 Dialysis till going.
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
## 2024-05-01 NOTE — ED Notes (Addendum)
 Pt up to use restroom, on return to bed pt O2 at 75%. [place] placed back on, pt bumped up to 4L [place]. Neb treatment provided. O2 rising to 99-100% during treatment.
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
## 2024-05-01 NOTE — ED Notes (Signed)
"  Dialysis at bedside  "

## 2024-05-01 NOTE — Progress Notes (Signed)
 " [facility]  ROUNDING NOTE   Subjective:   [name] Walker is a 49 year old male with past medical conditions including hypertension, diabetes, hyperlipidemia, pulmonary embolism, and end-stage renal disease on hemodialysis.  Patient presents to the emergency department today complaining of chest pain and shortness of breath.  He is admitted under observation for CHF (congestive heart failure) (HCC) [I50.9]  Patient is known to our practice and receives outpatient dialysis treatments at [facility] on a MWF schedule, supervised by Dr. Douglas.  Patient states he did miss treatment on Friday.  Patient seen in emergency department, sitting on stool beside stretcher.  States he has missed dialysis before but has never experienced shortness of breath or chest discomfort such as this.  Room air at baseline, currently on 4 L nasal cannula.  Labs on ED arrival concerning for potassium 5.1, BUN 111, creatinine 17.6 with GFR 3, troponin 127, white count 3.9 with hemoglobin 10.2.  Chest x-ray shows mild pulmonary edema with cardiomegaly.  CT angio chest negative for pulmonary embolism.  CT head also negative for acute findings.  Echo pending.  We have been consulted to evaluate and perform urgent dialysis today.   Objective:  Vital signs in last 24 hours:  Temp:  [97.4 F (36.3 C)-98.2 F (36.8 C)] 98.2 F (36.8 C) (01/04 1227) Pulse Rate:  [98-119] 104 (01/04 1245) Resp:  [13-30] 23 (01/04 1245) BP: (146-179)/(104-121) 175/114 (01/04 1245) SpO2:  [75 %-100 %] 100 % (01/04 1245)  Weight change:  There were no vitals filed for this visit.  Intake/Output: No intake/output data recorded.   Intake/Output this shift:  No intake/output data recorded.  Physical Exam: General: NAD, tripoding  Head: Normocephalic, atraumatic. Moist oral mucosal membranes  Eyes: Anicteric  Lungs:  Rales, wheeze, [place] O2  Heart: Regular rate and rhythm  Abdomen:  Soft, nontender  Extremities: ++  Peripheral edema.  Neurologic: Awake, alert, conversant  Skin: Warm,dry, no rash  Access: Left lower AVF    Basic Metabolic Panel: Recent Labs  Lab 05/01/24 0444 05/01/24 0920  NA 143  --   K 5.1  --   CL 97*  --   CO2 22  --   GLUCOSE 83  --   BUN 111*  --   CREATININE 17.60* 17.50*  CALCIUM  8.6*  --     Liver Function Tests: No results for input(s): AST, ALT, ALKPHOS, BILITOT, PROT, ALBUMIN in the last 168 hours. No results for input(s): LIPASE, AMYLASE in the last 168 hours. No results for input(s): AMMONIA in the last 168 hours.  CBC: Recent Labs  Lab 05/01/24 0444 05/01/24 0920  WBC 3.9* 4.4  HGB 10.2* 9.7*  HCT 31.7* 30.1*  MCV 93.5 93.8  PLT 133* 130*    Cardiac Enzymes: No results for input(s): CKTOTAL, CKMB, CKMBINDEX, TROPONINI in the last 168 hours.  BNP: Invalid input(s): POCBNP  CBG: No results for input(s): GLUCAP in the last 168 hours.  Microbiology: Results for orders placed or performed during the hospital encounter of 02/10/24  Resp panel by RT-PCR (RSV, Flu A&B, Covid) Anterior Nasal Swab     Status: None   Collection Time: 02/10/24 11:09 PM   Specimen: Anterior Nasal Swab  Result Value Ref Range Status   SARS Coronavirus 2 by RT PCR NEGATIVE NEGATIVE Final    Comment: (NOTE) SARS-CoV-2 target nucleic acids are NOT DETECTED.  The SARS-CoV-2 RNA is generally detectable in upper respiratory specimens during the acute phase of infection. The lowest  concentration of SARS-CoV-2 viral copies this assay can detect is 138 copies/mL. A negative result does not preclude SARS-Cov-2 infection and should not be used as the sole basis for treatment or other patient management decisions. A negative result may occur with  improper specimen collection/handling, submission of specimen other than nasopharyngeal swab, presence of viral mutation(s) within the areas targeted by this assay, and inadequate number of  viral copies(<138 copies/mL). A negative result must be combined with clinical observations, patient history, and epidemiological information. The expected result is Negative.  Fact Sheet for Patients:  bloggercourse.com  Fact Sheet for Healthcare Providers:  seriousbroker.it  This test is no t yet approved or cleared by the [place]  FDA and  has been authorized for detection and/or diagnosis of SARS-CoV-2 by FDA under an Emergency Use Authorization (EUA). This EUA will remain  in effect (meaning this test can be used) for the duration of the COVID-19 declaration under Section 564(b)(1) of the Act, 21 U.S.C.section 360bbb-3(b)(1), unless the authorization is terminated  or revoked sooner.       Influenza A by PCR NEGATIVE NEGATIVE Final   Influenza B by PCR NEGATIVE NEGATIVE Final    Comment: (NOTE) The Xpert Xpress SARS-CoV-2/FLU/RSV plus assay is intended as an aid in the diagnosis of influenza from Nasopharyngeal swab specimens and should not be used as a sole basis for treatment. Nasal washings and aspirates are unacceptable for Xpert Xpress SARS-CoV-2/FLU/RSV testing.  Fact Sheet for Patients: bloggercourse.com  Fact Sheet for Healthcare Providers: seriousbroker.it  This test is not yet approved or cleared by the [place]  FDA and has been authorized for detection and/or diagnosis of SARS-CoV-2 by FDA under an Emergency Use Authorization (EUA). This EUA will remain in effect (meaning this test can be used) for the duration of the COVID-19 declaration under Section 564(b)(1) of the Act, 21 U.S.C. section 360bbb-3(b)(1), unless the authorization is terminated or revoked.     Resp Syncytial Virus by PCR NEGATIVE NEGATIVE Final    Comment: (NOTE) Fact Sheet for Patients: bloggercourse.com  Fact Sheet for Healthcare  Providers: seriousbroker.it  This test is not yet approved or cleared by the [place]  FDA and has been authorized for detection and/or diagnosis of SARS-CoV-2 by FDA under an Emergency Use Authorization (EUA). This EUA will remain in effect (meaning this test can be used) for the duration of the COVID-19 declaration under Section 564(b)(1) of the Act, 21 U.S.C. section 360bbb-3(b)(1), unless the authorization is terminated or revoked.  Performed at [facility], [address] [place], [place] [address]     Coagulation Studies: No results for input(s): LABPROT, INR in the last 72 hours.  Urinalysis: No results for input(s): COLORURINE, LABSPEC, PHURINE, GLUCOSEU, HGBUR, BILIRUBINUR, KETONESUR, PROTEINUR, UROBILINOGEN, NITRITE, LEUKOCYTESUR in the last 72 hours.  Invalid input(s): APPERANCEUR    Imaging: CT Angio Chest PE W and/or Wo Contrast Result Date: 05/01/2024 EXAM: CTA CHEST 05/01/2024 07:41:20 AM TECHNIQUE: CTA of the chest was performed after the administration of 75 mL of iohexol  (OMNIPAQUE ) 350 MG/ML injection. Multiplanar reformatted images are provided for review. MIP images are provided for review. Automated exposure control, iterative reconstruction, and/or weight based adjustment of the mA/kV was utilized to reduce the radiation dose to as low as reasonably achievable. COMPARISON: CT of the chest dated 03/01/2012. CLINICAL HISTORY: Pulmonary embolism (PE) suspected, high prob. FINDINGS: PULMONARY ARTERIES: The study is somewhat limited by suboptimal opacification of the pulmonary arteries and by respiratory motion. No acute pulmonary embolus. Main pulmonary  artery is normal in caliber. MEDIASTINUM: The heart is enlarged. The pericardium demonstrates no acute abnormality. There is no acute abnormality of the thoracic aorta. LYMPH NODES: No mediastinal, hilar or axillary lymphadenopathy. LUNGS AND PLEURA:  There is mild heterogeneous opacification of the lungs with scattered ground-glass opacities, more pronounced independently. There is mild dependent pleural thickening present bilaterally. No evidence of pleural effusion or pneumothorax. UPPER ABDOMEN: The liver demonstrates a nodular contour indicating underlying cirrhosis. SOFT TISSUES AND BONES: No acute bone or soft tissue abnormality. IMPRESSION: 1. No pulmonary embolism identified, although evaluation is limited by suboptimal opacification of the pulmonary arteries and respiratory motion. 2. Mild heterogeneous opacification of the lungs with scattered ground-glass opacities, more pronounced dependently. Findings may represent asymmetric edema or atypical pneumonitis. 3. Mild dependent pleural thickening bilaterally. 4. Enlarged heart. 5. Liver with nodular contour, consistent with cirrhosis. Electronically signed by: Evalene Coho MD 05/01/2024 07:49 AM EST RP Workstation: [id number]   CT HEAD WO CONTRAST ( ) Result Date: 05/01/2024 EXAM: CT HEAD WITHOUT CONTRAST 05/01/2024 07:41:20 AM TECHNIQUE: CT of the head was performed without the administration of intravenous contrast. Automated exposure control, iterative reconstruction, and/or weight based adjustment of the mA/kV was utilized to reduce the radiation dose to as low as reasonably achievable. COMPARISON: 04/12/2024 CLINICAL HISTORY: Head trauma, abnormal mental status (Age 32-64y). FINDINGS: BRAIN AND VENTRICLES: No acute hemorrhage. No evidence of acute infarct. No hydrocephalus. No extra-axial collection. No mass effect or midline shift. ORBITS: Remote left medial orbital wall fracture. SINUSES: Small right ethmoid osteoma. SOFT TISSUES AND SKULL: No acute soft tissue abnormality. No skull fracture. IMPRESSION: 1. No acute intracranial abnormality. 2. Remote left medial orbital wall fracture. 3. Small right ethmoid osteoma. Electronically signed by: Evalene Coho MD 05/01/2024 07:44 AM EST RP  Workstation: [id number]   DG Chest 2 View Result Date: 05/01/2024 EXAM: 2 VIEW(S) XRAY OF THE CHEST 05/01/2024 05:59:48 AM COMPARISON: 04/12/2024 CLINICAL HISTORY: CP FINDINGS: LUNGS AND PLEURA: Mild pulmonary edema. No pleural effusion. No pneumothorax. HEART AND MEDIASTINUM: Cardiomegaly. BONES AND SOFT TISSUES: Thoracic degenerative changes. IMPRESSION: 1. Cardiomegaly. 2. Mild pulmonary edema. Electronically signed by: Evalene Coho MD 05/01/2024 06:19 AM EST RP Workstation: [id number]     Medications:     Chlorhexidine  Gluconate Cloth  6 each Topical Q0600   furosemide   40 mg Intravenous BID   gabapentin   100 mg Oral QHS   heparin   5,000 Units Subcutaneous Q8H   rOPINIRole   1 mg Oral TID   acetaminophen  **OR** acetaminophen , butalbital -acetaminophen -caffeine , fentaNYL  (SUBLIMAZE ) injection, hydrALAZINE , ipratropium-albuterol , ondansetron  **OR** ondansetron  (ZOFRAN ) IV, oxyCODONE , polyethylene glycol  Assessment/ Plan:  Mr. [name] Walker is a 49 y.o.  male with past medical conditions including hypertension, diabetes, hyperlipidemia, pulmonary embolism, and end-stage renal disease on hemodialysis.  Patient presents to the emergency department today complaining of chest pain and shortness of breath.  He is admitted under observation for CHF (congestive heart failure) (HCC) [I50.9]   Acute respiratory failure requiring supplemental oxygen.  Room air at baseline, currently 4 L nasal cannula.  Chest x-ray shows mild pulmonary edema.  Rales and wheezing present on auscultation.  Will perform urgent hemodialysis treatment for fluid removal.    2.  End-stage renal disease on hemodialysis.  Last treatment received on Wednesday.  Will perform urgent dialysis today and attempt fluid removal of 3 to 3.5 L.  Next treatment scheduled for Monday per outpatient schedule.  3.  Chest pain with elevated troponin, believed due to demand ischemia.  Will continue to trend  troponins.  Echo pending.  4.  Anemia of chronic kidney disease Lab Results  Component Value Date   HGB 9.7 (L) 05/01/2024    Hemoglobin acceptable for renal patient.  No need for ESA's at this time.  5. Secondary Hyperparathyroidism: with outpatient labs: PTH 590, phosphorus 10.3, calcium  8.7 on 04/04/2024.   Lab Results  Component Value Date   CALCIUM  8.6 (L) 05/01/2024   CAION 1.18 10/02/2022   PHOS 3.3 05/17/2018    Will continue to monitor bone minerals during this admission.   LOS: 0 Valli Randol 1/4/202612:51 PM   "

## 2024-05-01 NOTE — ED Notes (Signed)
 Pt stating he attempted to use the commode but got very SOB, returned to bed and placed Accord back on. Pt standing hunched over the railing at bedside when this RN walked into room. Wife also at bedside. Pt then tells this RN he has blown his nose a couple times and is getting blood clots. No bleeding noted at this time. Pt advised not to blow nose if he can help it. Dr Roann made aware.

## 2024-05-01 NOTE — ED Notes (Signed)
 Cardiologist at bedside. Dialysis RN setting up for dialysis. Pt speaking in full sentences but is SOB. +1 pitting to bilateral LE. Pt missed dialysis on Friday.

## 2024-05-02 ENCOUNTER — Observation Stay
Admit: 2024-05-02 | Discharge: 2024-05-02 | Disposition: A | Discharge status: Home / Self Care | Attending: Hospitalist | Admitting: Hospitalist

## 2024-05-02 DIAGNOSIS — E782 Mixed hyperlipidemia: Secondary | ICD-10-CM

## 2024-05-02 DIAGNOSIS — F419 Anxiety disorder, unspecified: Secondary | ICD-10-CM

## 2024-05-02 DIAGNOSIS — Z992 Dependence on renal dialysis: Secondary | ICD-10-CM | POA: Diagnosis not present

## 2024-05-02 DIAGNOSIS — I10 Essential (primary) hypertension: Secondary | ICD-10-CM | POA: Diagnosis not present

## 2024-05-02 DIAGNOSIS — I5043 Acute on chronic combined systolic (congestive) and diastolic (congestive) heart failure: Secondary | ICD-10-CM

## 2024-05-02 DIAGNOSIS — N186 End stage renal disease: Secondary | ICD-10-CM

## 2024-05-02 DIAGNOSIS — G4733 Obstructive sleep apnea (adult) (pediatric): Secondary | ICD-10-CM

## 2024-05-02 LAB — COMPREHENSIVE METABOLIC PANEL WITH GFR
ALT: 17 U/L (ref 0–44)
AST: 29 U/L (ref 15–41)
Albumin: 4.6 g/dL (ref 3.5–5.0)
Alkaline Phosphatase: 102 U/L (ref 38–126)
Anion gap: 18 — ABNORMAL HIGH (ref 5–15)
BUN: 71 mg/dL — ABNORMAL HIGH (ref 6–20)
CO2: 28 mmol/L (ref 22–32)
Calcium: 9.3 mg/dL (ref 8.9–10.3)
Chloride: 95 mmol/L — ABNORMAL LOW (ref 98–111)
Creatinine, Ser: 12.6 mg/dL — ABNORMAL HIGH (ref 0.61–1.24)
GFR, Estimated: 4 mL/min — ABNORMAL LOW
Glucose, Bld: 84 mg/dL (ref 70–99)
Potassium: 4.4 mmol/L (ref 3.5–5.1)
Sodium: 141 mmol/L (ref 135–145)
Total Bilirubin: 0.4 mg/dL (ref 0.0–1.2)
Total Protein: 8 g/dL (ref 6.5–8.1)

## 2024-05-02 LAB — CBC
HCT: 35.1 % — ABNORMAL LOW (ref 39.0–52.0)
Hemoglobin: 11.1 g/dL — ABNORMAL LOW (ref 13.0–17.0)
MCH: 29.8 pg (ref 26.0–34.0)
MCHC: 31.6 g/dL (ref 30.0–36.0)
MCV: 94.1 fL (ref 80.0–100.0)
Platelets: 139 K/uL — ABNORMAL LOW (ref 150–400)
RBC: 3.73 MIL/uL — ABNORMAL LOW (ref 4.22–5.81)
RDW: 16.5 % — ABNORMAL HIGH (ref 11.5–15.5)
WBC: 4.8 K/uL (ref 4.0–10.5)
nRBC: 0 % (ref 0.0–0.2)

## 2024-05-02 LAB — ECHOCARDIOGRAM COMPLETE
AR max vel: 2.53 cm2
AV Area VTI: 2.44 cm2
AV Area mean vel: 2.55 cm2
AV Mean grad: 4 mmHg
AV Peak grad: 8 mmHg
Ao pk vel: 1.41 m/s
Area-P 1/2: 6.27 cm2
Calc EF: 35.9 %
S' Lateral: 4.5 cm
Single Plane A2C EF: 40.4 %
Single Plane A4C EF: 33.7 %
Weight: 3728.42 [oz_av]

## 2024-05-02 LAB — PROTIME-INR
INR: 1.1 (ref 0.8–1.2)
Prothrombin Time: 14.7 s (ref 11.4–15.2)

## 2024-05-02 LAB — HIV ANTIBODY (ROUTINE TESTING W REFLEX): HIV Screen 4th Generation wRfx: NONREACTIVE

## 2024-05-02 MED ORDER — TORSEMIDE 20 MG PO TABS: 20.0000 mg | ORAL_TABLET | Freq: Every day | ORAL | 1 refills | Status: AC

## 2024-05-02 MED ORDER — FUROSEMIDE 40 MG PO TABS: 40.0000 mg | ORAL_TABLET | Freq: Every day | ORAL | Status: DC

## 2024-05-02 MED ORDER — CARVEDILOL 3.125 MG PO TABS: 3.1250 mg | ORAL_TABLET | Freq: Two times a day (BID) | ORAL | 1 refills | Status: AC

## 2024-05-02 MED ORDER — ASPIRIN 81 MG PO TBEC: 81.0000 mg | DELAYED_RELEASE_TABLET | Freq: Every day | ORAL | 12 refills | Status: AC

## 2024-05-02 MED ORDER — PENTAFLUOROPROP-TETRAFLUOROETH EX AERO
INHALATION_SPRAY | CUTANEOUS | Status: AC
  Filled 2024-05-02: qty 30

## 2024-05-02 MED ORDER — ONDANSETRON HCL 4 MG/2ML IJ SOLN
INTRAMUSCULAR | Status: AC
  Filled 2024-05-02: qty 2

## 2024-05-02 MED ORDER — GABAPENTIN 100 MG PO CAPS
200.0000 mg | ORAL_CAPSULE | Freq: Once | ORAL | Status: AC
  Administered 2024-05-02: 200 mg via ORAL
  Filled 2024-05-02: qty 2

## 2024-05-02 MED ORDER — CARVEDILOL 3.125 MG PO TABS: 3.1250 mg | ORAL_TABLET | Freq: Two times a day (BID) | ORAL | Status: DC

## 2024-05-02 NOTE — Progress Notes (Signed)
 Heart Failure Navigator Progress Note  Assessed for Heart & Vascular TOC clinic readiness.  Patient does not meet criteria due to current [facility] patient.   Navigator will sign off at this time.  Roxy Horseman, RN, BSN [facility] Heart Failure Navigator Secure Chat Only

## 2024-05-02 NOTE — Progress Notes (Signed)
*  PRELIMINARY RESULTS* Echocardiogram 2D Echocardiogram has been performed.  [name] Walker 05/02/2024, 8:08 AM
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
## 2024-05-02 NOTE — Plan of Care (Signed)

## 2024-05-02 NOTE — Hospital Course (Addendum)
 Partly taken from prior notes.  [name] Walker is a pleasant 49 y.o. male with medical history significant for ESRD on hemodialysis M-W-F missed dialysis on Friday, DM, HTN, HLD, sleep apnea, history of pulmonary embolism not on anticoagulation which was Took of  few years ago who came into ED complaining of chest pain and shortness of breath that woke him up this morning.   On presentation patient was hypertensive at 179/109, mild tachycardia, labs with BUN 111 and creatinine 17.60, anion gap of 24, WBC 3.9, troponin 127, EKG showed sinus tachycardia at 0107 bpm without ST elevation, chest x-ray showed pulmonary edema. CT chest showed no pulmonary embolism.   Cardiology and nephrology was consulted and patient was taken for urgent dialysis.  1/5: Blood pressure mildly elevated at 157/101, labs stable around baseline.  Echocardiogram with EF of 40 to 45% with mildly dilated cardiomyopathy and grade 1 diastolic dysfunction.  Cardiology does not want any further investigation.  Able to wean back to room air.  Received 2 dialysis for removal of extra fluid.  Patient was instructed not to miss dialysis.  Low-dose carvedilol  was added and home torsemide  was made daily.  Patient will continue on current medications and need to have a close follow-up with his providers for further assistance.

## 2024-05-02 NOTE — Progress Notes (Signed)
 Patient back from dialysis and eager to go home. AVS teaching provided to patient and patients wife. No further questions or needs identified. PIV and telemetry removed. Patient declined being wheeled out in a wheelchair and preferred to walk out.

## 2024-05-02 NOTE — Progress Notes (Signed)
 " [facility]  ROUNDING NOTE   Subjective:   [name] Walker is a 49 year old male with past medical conditions including hypertension, diabetes, hyperlipidemia, pulmonary embolism, and end-stage renal disease on hemodialysis.  Patient presents to the emergency department today complaining of chest pain and shortness of breath.  He is admitted under observation for CHF (congestive heart failure) (HCC) [I50.9] ESRD (end stage renal disease) on dialysis (HCC) [N18.6, Z99.2] Acute respiratory failure with hypoxia (HCC) [J96.01]  Patient is known to our practice and receives outpatient dialysis treatments at [facility] on a MWF schedule, supervised by Dr. Douglas.    Patient seen sitting up in chair Continues to report mild shortness of breath Tolerating dialysis yesterday Weaned to room air   Objective:  Vital signs in last 24 hours:  Temp:  [97.8 F (36.6 C)-98.6 F (37 C)] 98.3 F (36.8 C) (01/05 1036) Pulse Rate:  [97-124] 108 (01/05 1300) Resp:  [8-27] 25 (01/05 1300) BP: (117-191)/(91-138) 141/91 (01/05 1300) SpO2:  [95 %-100 %] 96 % (01/05 1300) Weight:  [105.7 kg-109.6 kg] 107.9 kg (01/05 1036)  Weight change:  Filed Weights   05/01/24 1635 05/02/24 0500 05/02/24 1036  Weight: 109.6 kg 105.7 kg 107.9 kg    Intake/Output: I/O last 3 completed shifts: In: -  Out: 4400 [Other:4400]   Intake/Output this shift:  No intake/output data recorded.  Physical Exam: General: NAD, tripoding  Head: Normocephalic, atraumatic. Moist oral mucosal membranes  Eyes: Anicteric  Lungs:  Faint wheeze, room air  Heart: Regular rate and rhythm  Abdomen:  Soft, nontender  Extremities: + Peripheral edema.  Neurologic: Awake, alert, conversant  Skin: Warm,dry, no rash  Access: Left lower AVF    Basic Metabolic Panel: Recent Labs  Lab 05/01/24 0444 05/01/24 0920 05/02/24 0430  NA 143  --  141  K 5.1  --  4.4  CL 97*  --  95*  CO2 22  --  28  GLUCOSE 83  --  84   BUN 111*  --  71*  CREATININE 17.60* 17.50* 12.60*  CALCIUM  8.6*  --  9.3    Liver Function Tests: Recent Labs  Lab 05/02/24 0430  AST 29  ALT 17  ALKPHOS 102  BILITOT 0.4  PROT 8.0  ALBUMIN 4.6   No results for input(s): LIPASE, AMYLASE in the last 168 hours. No results for input(s): AMMONIA in the last 168 hours.  CBC: Recent Labs  Lab 05/01/24 0444 05/01/24 0920 05/02/24 0430  WBC 3.9* 4.4 4.8  HGB 10.2* 9.7* 11.1*  HCT 31.7* 30.1* 35.1*  MCV 93.5 93.8 94.1  PLT 133* 130* 139*    Cardiac Enzymes: No results for input(s): CKTOTAL, CKMB, CKMBINDEX, TROPONINI in the last 168 hours.  BNP: Invalid input(s): POCBNP  CBG: No results for input(s): GLUCAP in the last 168 hours.  Microbiology: Results for orders placed or performed during the hospital encounter of 02/10/24  Resp panel by RT-PCR (RSV, Flu A&B, Covid) Anterior Nasal Swab     Status: None   Collection Time: 02/10/24 11:09 PM   Specimen: Anterior Nasal Swab  Result Value Ref Range Status   SARS Coronavirus 2 by RT PCR NEGATIVE NEGATIVE Final    Comment: (NOTE) SARS-CoV-2 target nucleic acids are NOT DETECTED.  The SARS-CoV-2 RNA is generally detectable in upper respiratory specimens during the acute phase of infection. The lowest concentration of SARS-CoV-2 viral copies this assay can detect is 138 copies/mL. A negative result does not preclude SARS-Cov-2  infection and should not be used as the sole basis for treatment or other patient management decisions. A negative result may occur with  improper specimen collection/handling, submission of specimen other than nasopharyngeal swab, presence of viral mutation(s) within the areas targeted by this assay, and inadequate number of viral copies(<138 copies/mL). A negative result must be combined with clinical observations, patient history, and epidemiological information. The expected result is Negative.  Fact Sheet for Patients:   bloggercourse.com  Fact Sheet for Healthcare Providers:  seriousbroker.it  This test is no t yet approved or cleared by the [place]  FDA and  has been authorized for detection and/or diagnosis of SARS-CoV-2 by FDA under an Emergency Use Authorization (EUA). This EUA will remain  in effect (meaning this test can be used) for the duration of the COVID-19 declaration under Section 564(b)(1) of the Act, 21 U.S.C.section 360bbb-3(b)(1), unless the authorization is terminated  or revoked sooner.       Influenza A by PCR NEGATIVE NEGATIVE Final   Influenza B by PCR NEGATIVE NEGATIVE Final    Comment: (NOTE) The Xpert Xpress SARS-CoV-2/FLU/RSV plus assay is intended as an aid in the diagnosis of influenza from Nasopharyngeal swab specimens and should not be used as a sole basis for treatment. Nasal washings and aspirates are unacceptable for Xpert Xpress SARS-CoV-2/FLU/RSV testing.  Fact Sheet for Patients: bloggercourse.com  Fact Sheet for Healthcare Providers: seriousbroker.it  This test is not yet approved or cleared by the [place]  FDA and has been authorized for detection and/or diagnosis of SARS-CoV-2 by FDA under an Emergency Use Authorization (EUA). This EUA will remain in effect (meaning this test can be used) for the duration of the COVID-19 declaration under Section 564(b)(1) of the Act, 21 U.S.C. section 360bbb-3(b)(1), unless the authorization is terminated or revoked.     Resp Syncytial Virus by PCR NEGATIVE NEGATIVE Final    Comment: (NOTE) Fact Sheet for Patients: bloggercourse.com  Fact Sheet for Healthcare Providers: seriousbroker.it  This test is not yet approved or cleared by the [place]  FDA and has been authorized for detection and/or diagnosis of SARS-CoV-2 by FDA under an Emergency Use  Authorization (EUA). This EUA will remain in effect (meaning this test can be used) for the duration of the COVID-19 declaration under Section 564(b)(1) of the Act, 21 U.S.C. section 360bbb-3(b)(1), unless the authorization is terminated or revoked.  Performed at [facility], [address] [place], [place] [address]     Coagulation Studies: Recent Labs    05/02/24 0430  LABPROT 14.7  INR 1.1    Urinalysis: No results for input(s): COLORURINE, LABSPEC, PHURINE, GLUCOSEU, HGBUR, BILIRUBINUR, KETONESUR, PROTEINUR, UROBILINOGEN, NITRITE, LEUKOCYTESUR in the last 72 hours.  Invalid input(s): APPERANCEUR    Imaging: ECHOCARDIOGRAM COMPLETE Result Date: 05/02/2024    ECHOCARDIOGRAM REPORT   Patient Name:   [name] Walker Date of Exam: 05/02/2024 Medical Rec #:  [id number]    Height:       69.0 in Accession #:    [id number]   Weight:       233.0 lb Date of Birth:  April 25, 1976    BSA:          2.205 m Patient Age:    48 years     BP:           143/96 mmHg Patient Gender: M            HR:           103 bpm. Exam  Location:  [facility] Procedure: 2D Echo, 3D Echo, Cardiac Doppler, Color Doppler and Strain Analysis            (Both Spectral and Color Flow Doppler were utilized during            procedure). Indications:     CHF-acute diastolic I50.31  History:         Patient has no prior history of Echocardiogram examinations.                  Risk Factors:Hypertension, Diabetes and Sleep Apnea. Pulmonary                  embolism.  Sonographer:     Christopher Furnace Referring Phys:  [id number] [facility] PAUDEL Diagnosing Phys: Cara JONETTA Lovelace MD  Sonographer Comments: Global longitudinal strain was attempted. IMPRESSIONS  1. Left ventricular ejection fraction, by estimation, is 40 to 45%. The left ventricle has mildly decreased function. The left ventricle demonstrates global hypokinesis. The left ventricular internal cavity size was moderately to severely dilated. There  is moderate  asymmetric left ventricular hypertrophy. Left ventricular diastolic parameters are consistent with Grade I diastolic dysfunction (impaired relaxation). The average left ventricular global longitudinal strain is 7.7 %. The global longitudinal  strain is abnormal.  2. Right ventricular systolic function is low normal. The right ventricular size is mildly enlarged. Mildly increased right ventricular wall thickness.  3. Left atrial size was mildly dilated.  4. Right atrial size was mildly dilated.  5. The mitral valve is normal in structure. Mild mitral valve regurgitation.  6. The aortic valve is grossly normal. Aortic valve regurgitation is trivial. FINDINGS  Left Ventricle: Left ventricular ejection fraction, by estimation, is 40 to 45%. The left ventricle has mildly decreased function. The left ventricle demonstrates global hypokinesis. The average left ventricular global longitudinal strain is 7.7 %. Strain was performed and the global longitudinal strain is abnormal. The left ventricular internal cavity size was moderately to severely dilated. There is moderate asymmetric left ventricular hypertrophy. Left ventricular diastolic parameters are consistent with Grade I diastolic dysfunction (impaired relaxation). Right Ventricle: The right ventricular size is mildly enlarged. Mildly increased right ventricular wall thickness. Right ventricular systolic function is low normal. Left Atrium: Left atrial size was mildly dilated. Right Atrium: Right atrial size was mildly dilated. Pericardium: There is no evidence of pericardial effusion. Mitral Valve: The mitral valve is normal in structure. Mild mitral valve regurgitation. Tricuspid Valve: The tricuspid valve is normal in structure. Tricuspid valve regurgitation is mild. Aortic Valve: The aortic valve is grossly normal. Aortic valve regurgitation is trivial. Aortic valve mean gradient measures 4.0 mmHg. Aortic valve peak gradient measures 8.0 mmHg. Aortic valve area, by  VTI measures 2.44 cm. Pulmonic Valve: The pulmonic valve was not well visualized. Pulmonic valve regurgitation is not visualized. Aorta: The ascending aorta was not well visualized. IAS/Shunts: No atrial level shunt detected by color flow Doppler. Additional Comments: 3D was performed not requiring image post processing on an independent workstation and was abnormal.  LEFT VENTRICLE PLAX 2D LVIDd:         6.10 cm      Diastology LVIDs:         4.50 cm      LV e' medial:    6.96 cm/s LV PW:         1.30 cm      LV E/e' medial:  9.3 LV IVS:        1.80 cm  LV e' lateral:   7.83 cm/s LVOT diam:     2.30 cm      LV E/e' lateral: 8.2 LV SV:         60 LV SV Index:   27           2D Longitudinal Strain LVOT Area:     4.15 cm     2D Strain GLS (A4C):   7.8 % LV IVRT:       116 msec     2D Strain GLS (A3C):   5.9 %                             2D Strain GLS (A2C):   9.3 %                             2D Strain GLS Avg:     7.7 % LV Volumes (MOD) LV vol d, MOD A2C: 111.0 ml LV vol d, MOD A4C: 169.0 ml LV vol s, MOD A2C: 66.2 ml LV vol s, MOD A4C: 112.0 ml LV SV MOD A2C:     44.8 ml LV SV MOD A4C:     169.0 ml LV SV MOD BP:      51.5 ml RIGHT VENTRICLE RV Basal diam:  3.40 cm  PULMONARY VEINS RV Mid diam:    2.80 cm  Diastolic Velocity: 63.70 cm/s                          S/D Velocity:       0.50                          Systolic Velocity:  32.80 cm/s LEFT ATRIUM             Index        RIGHT ATRIUM           Index LA diam:        4.30 cm 1.95 cm/m   RA Area:     20.20 cm LA Vol (A2C):   65.6 ml 29.76 ml/m  RA Volume:   56.90 ml  25.81 ml/m LA Vol (A4C):   67.8 ml 30.75 ml/m LA Biplane Vol: 69.9 ml 31.71 ml/m  AORTIC VALVE AV Area (Vmax):    2.53 cm AV Area (Vmean):   2.55 cm AV Area (VTI):     2.44 cm AV Vmax:           141.00 cm/s AV Vmean:          93.800 cm/s AV VTI:            0.245 m AV Peak Grad:      8.0 mmHg AV Mean Grad:      4.0 mmHg LVOT Vmax:         85.90 cm/s LVOT Vmean:        57.600 cm/s LVOT  VTI:          0.144 m LVOT/AV VTI ratio: 0.59  AORTA Ao Root diam: 3.20 cm MITRAL VALVE MV Area (PHT): 6.27 cm     SHUNTS MV Decel Time: 121 msec     Systemic VTI:  0.14 m MV E velocity: 64.50 cm/s   Systemic Diam: 2.30 cm MV A velocity: 106.00 cm/s MV E/A ratio:  0.61 Dwayne D Callwood  MD Electronically signed by Cara JONETTA Lovelace MD Signature Date/Time: 05/02/2024/1:26:29 PM    Final    CT Angio Chest PE W and/or Wo Contrast Result Date: 05/01/2024 EXAM: CTA CHEST 05/01/2024 07:41:20 AM TECHNIQUE: CTA of the chest was performed after the administration of 75 mL of iohexol  (OMNIPAQUE ) 350 MG/ML injection. Multiplanar reformatted images are provided for review. MIP images are provided for review. Automated exposure control, iterative reconstruction, and/or weight based adjustment of the mA/kV was utilized to reduce the radiation dose to as low as reasonably achievable. COMPARISON: CT of the chest dated 03/01/2012. CLINICAL HISTORY: Pulmonary embolism (PE) suspected, high prob. FINDINGS: PULMONARY ARTERIES: The study is somewhat limited by suboptimal opacification of the pulmonary arteries and by respiratory motion. No acute pulmonary embolus. Main pulmonary artery is normal in caliber. MEDIASTINUM: The heart is enlarged. The pericardium demonstrates no acute abnormality. There is no acute abnormality of the thoracic aorta. LYMPH NODES: No mediastinal, hilar or axillary lymphadenopathy. LUNGS AND PLEURA: There is mild heterogeneous opacification of the lungs with scattered ground-glass opacities, more pronounced independently. There is mild dependent pleural thickening present bilaterally. No evidence of pleural effusion or pneumothorax. UPPER ABDOMEN: The liver demonstrates a nodular contour indicating underlying cirrhosis. SOFT TISSUES AND BONES: No acute bone or soft tissue abnormality. IMPRESSION: 1. No pulmonary embolism identified, although evaluation is limited by suboptimal opacification of the pulmonary  arteries and respiratory motion. 2. Mild heterogeneous opacification of the lungs with scattered ground-glass opacities, more pronounced dependently. Findings may represent asymmetric edema or atypical pneumonitis. 3. Mild dependent pleural thickening bilaterally. 4. Enlarged heart. 5. Liver with nodular contour, consistent with cirrhosis. Electronically signed by: Evalene Coho MD 05/01/2024 07:49 AM EST RP Workstation: [id number]   CT HEAD WO CONTRAST ( ) Result Date: 05/01/2024 EXAM: CT HEAD WITHOUT CONTRAST 05/01/2024 07:41:20 AM TECHNIQUE: CT of the head was performed without the administration of intravenous contrast. Automated exposure control, iterative reconstruction, and/or weight based adjustment of the mA/kV was utilized to reduce the radiation dose to as low as reasonably achievable. COMPARISON: 04/12/2024 CLINICAL HISTORY: Head trauma, abnormal mental status (Age 55-64y). FINDINGS: BRAIN AND VENTRICLES: No acute hemorrhage. No evidence of acute infarct. No hydrocephalus. No extra-axial collection. No mass effect or midline shift. ORBITS: Remote left medial orbital wall fracture. SINUSES: Small right ethmoid osteoma. SOFT TISSUES AND SKULL: No acute soft tissue abnormality. No skull fracture. IMPRESSION: 1. No acute intracranial abnormality. 2. Remote left medial orbital wall fracture. 3. Small right ethmoid osteoma. Electronically signed by: Evalene Coho MD 05/01/2024 07:44 AM EST RP Workstation: [id number]   DG Chest 2 View Result Date: 05/01/2024 EXAM: 2 VIEW(S) XRAY OF THE CHEST 05/01/2024 05:59:48 AM COMPARISON: 04/12/2024 CLINICAL HISTORY: CP FINDINGS: LUNGS AND PLEURA: Mild pulmonary edema. No pleural effusion. No pneumothorax. HEART AND MEDIASTINUM: Cardiomegaly. BONES AND SOFT TISSUES: Thoracic degenerative changes. IMPRESSION: 1. Cardiomegaly. 2. Mild pulmonary edema. Electronically signed by: Evalene Coho MD 05/01/2024 06:19 AM EST RP Workstation: [id number]      Medications:     aspirin  EC  81 mg Oral Daily   carvedilol   3.125 mg Oral BID WC   Chlorhexidine  Gluconate Cloth  6 each Topical Q0600   furosemide   40 mg Oral Daily   gabapentin   100 mg Oral QHS   heparin   5,000 Units Subcutaneous Q8H   rOPINIRole   1 mg Oral TID   acetaminophen  **OR** acetaminophen , butalbital -acetaminophen -caffeine , fentaNYL  (SUBLIMAZE ) injection, heparin , heparin , hydrALAZINE , ipratropium-albuterol , lidocaine -prilocaine , lidocaine -prilocaine , ondansetron  **OR** ondansetron  (ZOFRAN ) IV, oxyCODONE , pentafluoroprop-tetrafluoroeth, pentafluoroprop-tetrafluoroeth, polyethylene glycol  Assessment/ Plan:  [name] Walker is a 49 y.o.  male with past medical conditions including hypertension, diabetes, hyperlipidemia, pulmonary embolism, and end-stage renal disease on hemodialysis.  Patient presents to the emergency department today complaining of chest pain and shortness of breath.  He is admitted under observation for CHF (congestive heart failure) (HCC) [I50.9] ESRD (end stage renal disease) on dialysis (HCC) [N18.6, Z99.2] Acute respiratory failure with hypoxia (HCC) [J96.01]   Acute respiratory failure requiring supplemental oxygen.  Room air at baseline, currently 4 L nasal cannula.  Chest x-ray shows mild pulmonary edema.  Weaned to room air. Continues to have mild shortness of breath.  2.  End-stage renal disease on hemodialysis. Received urgent dialysis yesterday, UF 4.4L achieved. Receiving scheduled dialysis today, UF increased per patient request  to 4.5L. Will defer discharge plan to primary team.  3.  Chest pain with elevated troponin, believed due to demand ischemia.  Will continue to trend troponins.  Echo performed on 1/5, EF 40/45%  with global hypokinesis with a grade 1 diastolic dysfunction.   4. Anemia of chronic kidney disease Lab Results  Component Value Date   HGB 11.1 (L) 05/02/2024    Hemoglobin within optimal range  5. Secondary  Hyperparathyroidism: with outpatient labs: PTH 590, phosphorus 10.3, calcium  8.7 on 04/04/2024.   Lab Results  Component Value Date   CALCIUM  9.3 05/02/2024   CAION 1.18 10/02/2022   PHOS 3.3 05/17/2018    Will continue to monitor bone minerals during this admission.   LOS: 0 Judye Lorino 1/5/20261:46 PM   "

## 2024-05-02 NOTE — Progress Notes (Signed)
 D/C order noted. Contacted [facility] on MWF at 7:15am advised of pt and d/c today. Requested documents faxed to clinic for continuation of care.  Suzen Satchel Dialysis Navigator [phone]

## 2024-05-02 NOTE — TOC CM/SW Note (Signed)
 Transition of Care [facility]) - Inpatient Brief Assessment   Patient Details  Name: [name] Walker MRN: [id number] Date of Birth: 09/01/1975  Transition of Care [facility]) CM/SW Contact:    Shasta DELENA Daring, RN Phone Number: 05/02/2024, 9:38 AM   Clinical Narrative:  Transition of Care Department [facility]) has reviewed patient and no TOC needs have been identified at this time.  If new patient transition needs arise, please place a TOC consult.  Smoking cessation resources added to AVS  Transition of Care Asessment: Insurance and Status: Insurance coverage has been reviewed Patient has primary care physician: Yes Home environment has been reviewed: apartment Prior level of function:: independent Prior/Current Home Services: No current home services Social Drivers of Health Review: SDOH reviewed interventions complete Readmission risk has been reviewed: Yes Transition of care needs: no transition of care needs at this time

## 2024-05-03 LAB — HEPATITIS B SURFACE ANTIBODY, QUANTITATIVE: Hep B S AB Quant (Post): 33.7 m[IU]/mL

## 2024-05-05 ENCOUNTER — Telehealth: Payer: Self-pay | Admitting: Nurse Practitioner

## 2024-05-05 ENCOUNTER — Ambulatory Visit (INDEPENDENT_AMBULATORY_CARE_PROVIDER_SITE_OTHER): Admitting: Nurse Practitioner

## 2024-05-05 ENCOUNTER — Encounter: Payer: Self-pay | Admitting: Nurse Practitioner

## 2024-05-05 VITALS — BP 128/76 | HR 110 | Temp 95.3°F | Resp 16 | Ht 69.0 in | Wt 236.0 lb

## 2024-05-05 DIAGNOSIS — Z992 Dependence on renal dialysis: Secondary | ICD-10-CM

## 2024-05-05 DIAGNOSIS — H538 Other visual disturbances: Secondary | ICD-10-CM | POA: Diagnosis not present

## 2024-05-05 DIAGNOSIS — Z09 Encounter for follow-up examination after completed treatment for conditions other than malignant neoplasm: Secondary | ICD-10-CM | POA: Diagnosis not present

## 2024-05-05 DIAGNOSIS — G8929 Other chronic pain: Secondary | ICD-10-CM

## 2024-05-05 DIAGNOSIS — I509 Heart failure, unspecified: Secondary | ICD-10-CM

## 2024-05-05 DIAGNOSIS — M545 Low back pain, unspecified: Secondary | ICD-10-CM | POA: Diagnosis not present

## 2024-05-05 DIAGNOSIS — N186 End stage renal disease: Secondary | ICD-10-CM

## 2024-05-05 DIAGNOSIS — E1122 Type 2 diabetes mellitus with diabetic chronic kidney disease: Secondary | ICD-10-CM

## 2024-05-05 MED ORDER — ALBUTEROL SULFATE HFA 108 (90 BASE) MCG/ACT IN AERS: 1.0000 | INHALATION_SPRAY | Freq: Four times a day (QID) | RESPIRATORY_TRACT | 5 refills | Status: AC | PRN

## 2024-05-05 NOTE — Telephone Encounter (Signed)
 Urgent Cardiology referral faxed to Dr. Jama with [facility]; [phone] Notified patient. Gave pt telephone [phone]  Ophthalmology referral sent via [facility] to [facility]. Notified patient. Gave patient telephone # [phone]

## 2024-05-05 NOTE — Telephone Encounter (Signed)
 Ophthalmology appointment 05/10/2024 with [facility]

## 2024-05-05 NOTE — Progress Notes (Signed)
 [facility] [place] [address] [place] [address] [id number]                                   Transitional Care Clinic   [facility] Discharge Acute Issues Care Follow Up                                                                        Patient Demographics  [name] Walker, is a 49 y.o. male  DOB January 30, 1976  MRN [id number].  Primary MD  Liana Fish, NP  Admit date:     05/01/2024  Discharge date: 05/02/2024    Reason for TCC follow Up - CHF   Past Medical History:  Diagnosis Date   Anxiety    Chronic kidney disease    ESRD   Diabetes mellitus without complication (HCC)    hgb A!C 6.0   Family history of adverse reaction to anesthesia    mother states that she was able to feel everything during a procedure   Hypertension    Neuromuscular disorder (HCC)    Pulmonary embolism (HCC)    Sleep apnea     Past Surgical History:  Procedure Laterality Date   AV FISTULA PLACEMENT Left 10/02/2022   Procedure: ARTERIOVENOUS (AV) FISTULA CREATION (RADIALCEPHALIC);  Surgeon: Marea Selinda RAMAN, MD;  Location: [facility] ORS;  Service: Vascular;  Laterality: Left;   DIALYSIS/PERMA CATHETER INSERTION N/A 06/25/2022   Procedure: DIALYSIS/PERMA CATHETER INSERTION;  Surgeon: Marea Selinda RAMAN, MD;  Location: [facility] INVASIVE CV LAB;  Service: Cardiovascular;  Laterality: N/A;   TONSILLECTOMY  1985   UMBILICAL HERNIA REPAIR         Recent HPI and Hospital Course  Hospital Course: Partly taken from prior notes.   [name] is a pleasant 49 y.o. male with medical history significant for ESRD on hemodialysis M-W-F missed dialysis on Friday, DM, HTN, HLD, sleep apnea, history of pulmonary embolism not on anticoagulation which was Took of  few years ago who came into ED complaining of chest pain and shortness of breath that woke him up this morning.    On presentation patient was hypertensive at 179/109, mild tachycardia, labs with BUN 111 and creatinine  17.60, anion gap of 24, WBC 3.9, troponin 127, EKG showed sinus tachycardia at 0107 bpm without ST elevation, chest x-ray showed pulmonary edema. CT chest showed no pulmonary embolism.    Cardiology and nephrology was consulted and patient was taken for urgent dialysis.   1/5: Blood pressure mildly elevated at 157/101, labs stable around baseline.  Echocardiogram with mildly reduced EF of 40 to 45% with mildly dilated cardiomyopathy and grade 1 diastolic dysfunction.  Cardiology does not want any further investigation.  Able to wean back to room air.  Received 2 dialysis for removal of extra fluid.  Patient was instructed not to miss dialysis.  Low-dose carvedilol  was added and home torsemide  was made daily.   Patient will continue on current medications and need to have a close follow-up with his providers for further assistance.  Post Hospital Acute Care Issue to be followed in the Clinic   CHF (congestive heart failure) (  HCC) Active Problems:   HTN (hypertension)   HLD (hyperlipidemia)   OSA (obstructive sleep apnea)   Acute anxiety   Lumbar radiculopathy   Morbid obesity (HCC)   ESRD (end stage renal disease) on dialysis [facility])   Subjective:   Kasin Kilty today has, No headache, No chest pain, No abdominal pain - No Nausea, No new weakness tingling or numbness, No Cough - SOB. Low back pain, sciatica, SOB, activity intolerance.   Assessment & Plan   1. Congestive heart failure, unspecified HF chronicity, unspecified heart failure type (HCC) (Primary) Referred urgently to [facility] cardiology with Dr. Margery Ruth who also sees his mother.  - Ambulatory referral to Cardiology  2. Blurred vision, bilateral Referred to ophthalmology  - Ambulatory referral to Ophthalmology  3. Type 2 diabetes mellitus with chronic kidney disease on chronic dialysis, without long-term current use of insulin  (HCC) Referred to ophthalmology  - Ambulatory referral to Ophthalmology  4. ESRD (end stage renal  disease) on dialysis [facility]) Continuing hemodialysis but planning to transition to CAPD at home.   5. Chronic bilateral low back pain without sciatica Seeing the orthopedic spine specialist next week on 1/15  6. Hospital discharge follow-up Treated for CHF, need referral to cardiologist that his insurance will cover. Patient reminded to cancel appt for 05/12/24 with Dr. Custovic which was scheduled by the hospital since her office will not take his insurance.     Reason for frequent admissions/ER visits    ESRD CHF T2DM OSA Obesity   Objective:   Vitals:   05/05/24 0840  BP: 128/76  Pulse: (!) 110  Resp: 16  Temp: (!) 95.3 F (35.2 C)  SpO2: 98%  Weight: 236 lb (107 kg)  Height: 5' 9 (1.753 m)    Wt Readings from Last 3 Encounters:  05/05/24 236 lb (107 kg)  05/02/24 231 lb 0.7 oz (104.8 kg)  04/19/24 242 lb 9.6 oz (110 kg)    Allergies as of 05/05/2024       Reactions   Ace Inhibitors Cough   Amlodipine     Pedal edema   Daptomycin Other (See Comments)   Makes CK levels go very high   Metformin  And Related    diarrhea   Other    Seasonal allergies        Medication List        Accurate as of May 05, 2024  9:26 AM. If you have any questions, ask your nurse or doctor.          rOPINIRole  1 MG tablet Commonly known as: REQUIP  Take 1 tablet (1 mg total) by mouth 3 (three) times daily. The timing of this medication is very important.   acetaminophen  500 MG tablet Commonly known as: TYLENOL  Take 500 mg by mouth every 6 (six) hours as needed.   albuterol  108 (90 Base) MCG/ACT inhaler Commonly known as: VENTOLIN  HFA Inhale 1-2 puffs into the lungs every 6 (six) hours as needed for shortness of breath. Started by: Mardy Maxin, NP   aspirin  EC 81 MG tablet Take 1 tablet (81 mg total) by mouth daily. Swallow whole.   butalbital -acetaminophen -caffeine  50-325-40 MG tablet Commonly known as: FIORICET  Take 1 tablet by mouth every 6 (six) hours  as needed for headache.   carvedilol  3.125 MG tablet Commonly known as: COREG  Take 1 tablet (3.125 mg total) by mouth 2 (two) times daily with a meal.   celecoxib  100 MG capsule Commonly known as: CeleBREX  Take 1 capsule (100 mg total) by  mouth 2 (two) times daily.   cyanocobalamin 1000 MCG tablet Commonly known as: VITAMIN B12 Take 1,000 mcg by mouth 4 (four) times a week. Takes on non - dialysis days   gabapentin  100 MG capsule Commonly known as: NEURONTIN  Take 100 mg by mouth at bedtime.   hydrOXYzine  25 MG tablet Commonly known as: ATARAX  TAKE 1 TABLET (25 MG TOTAL) BY MOUTH 2 (TWO) TIMES DAILY AS NEEDED (ANXIETY/SLEEP).   lidocaine -prilocaine  cream Commonly known as: EMLA  Apply a quarter size amount of cream to the the access site of the left AV fistula 1.5 hours before dialysis appointment on Mondays, Wednesday and Fridays   NON FORMULARY CPAP everynight   Rena-Vite Rx 1 MG Tabs Take 1 tablet by mouth daily.   sevelamer carbonate 800 MG tablet Commonly known as: RENVELA Take 1,600 mg by mouth 3 (three) times daily.   tirzepatide  10 MG/0.5ML Pen Commonly known as: MOUNJARO  Inject 10 mg into the skin once a week.   torsemide  20 MG tablet Commonly known as: DEMADEX  Take 1 tablet (20 mg total) by mouth daily. Tues, Thursday, Saturday,Sunday   Vitamin D (Ergocalciferol) 1.25 MG (50000 UNIT) Caps capsule Commonly known as: DRISDOL TAKE 1 CAPSULE BY MOUTH ONCE A WEEK         Physical Exam: Constitutional: Patient appears well-developed and well-nourished. Not in obvious distress. HENT: Normocephalic, atraumatic, External right and left ear normal. Oropharynx is clear and moist.  Eyes: Conjunctivae and EOM are normal. PERRLA, no scleral icterus. Neck: Normal ROM. Neck supple. No JVD. No tracheal deviation. No thyromegaly. CVS: RRR, S1/S2 +, no murmurs, no gallops, no carotid bruit.  Pulmonary: Effort and breath sounds normal, no stridor, rhonchi, wheezes,  rales.  Abdominal: Soft. BS +, no distension, tenderness, rebound or guarding.  Musculoskeletal: Normal range of motion. No edema and no tenderness.  Lymphadenopathy: No lymphadenopathy noted, cervical, inguinal or axillary Neuro: Alert. Normal reflexes, muscle tone coordination. No cranial nerve deficit. Skin: Skin is warm and dry. No rash noted. Not diaphoretic. No erythema. No pallor. Psychiatric: Normal mood and affect. Behavior, judgment, thought content normal.   Data Review   Micro Results No results found for this or any previous visit (from the past 240 hours).   CBC Recent Labs  Lab 05/01/24 0444 05/01/24 0920 05/02/24 0430  WBC 3.9* 4.4 4.8  HGB 10.2* 9.7* 11.1*  HCT 31.7* 30.1* 35.1*  PLT 133* 130* 139*  MCV 93.5 93.8 94.1  MCH 30.1 30.2 29.8  MCHC 32.2 32.2 31.6  RDW 16.8* 16.7* 16.5*    Chemistries  Recent Labs  Lab 05/01/24 0444 05/01/24 0920 05/02/24 0430  NA 143  --  141  K 5.1  --  4.4  CL 97*  --  95*  CO2 22  --  28  GLUCOSE 83  --  84  BUN 111*  --  71*  CREATININE 17.60* 17.50* 12.60*  CALCIUM  8.6*  --  9.3  AST  --   --  29  ALT  --   --  17  ALKPHOS  --   --  102  BILITOT  --   --  0.4   ------------------------------------------------------------------------------------------------------------------ estimated creatinine clearance is 8.8 mL/min (A) (by C-G formula based on SCr of 12.6 mg/dL (H)). ------------------------------------------------------------------------------------------------------------------ No results for input(s): HGBA1C in the last 72 hours. ------------------------------------------------------------------------------------------------------------------ No results for input(s): CHOL, HDL, LDLCALC, TRIG, CHOLHDL, LDLDIRECT in the last 72 hours. ------------------------------------------------------------------------------------------------------------------ No results for input(s): TSH, T4TOTAL,  T3FREE, THYROIDAB in the last  72 hours.  Invalid input(s): FREET3 ------------------------------------------------------------------------------------------------------------------ No results for input(s): VITAMINB12, FOLATE, FERRITIN, TIBC, IRON, RETICCTPCT in the last 72 hours.  Coagulation profile Recent Labs  Lab 05/02/24 0430  INR 1.1    No results for input(s): DDIMER in the last 72 hours.  Cardiac Enzymes No results for input(s): CKMB, TROPONINI, MYOGLOBIN in the last 168 hours.  Invalid input(s): CK ------------------------------------------------------------------------------------------------------------------ Invalid input(s): POCBNP  Return in about 3 months (around 08/03/2024) for F/U, Recheck A1C, Jahnai Slingerland PCP and need note - out for 1 more week till seen by ortho 05/13/23.   Time Spent in minutes  45 Time spent with patient included reviewing progress notes, labs, imaging studies, and discussing plan for follow up.    This patient was seen by Mardy Maxin, FNP-C in collaboration with Dr. Sigrid Bathe as a part of collaborative care agreement.    Mardy Maxin MSN, FNP-C on 05/05/2024 at 9:26 AM   **Disclaimer: This note may have been dictated with voice recognition software. Similar sounding words can inadvertently be transcribed and this note may contain transcription errors which may not have been corrected upon publication of note.**

## 2024-05-24 ENCOUNTER — Ambulatory Visit: Admitting: Student in an Organized Health Care Education/Training Program

## 2024-05-24 ENCOUNTER — Encounter: Payer: Self-pay | Admitting: Student in an Organized Health Care Education/Training Program

## 2024-05-24 VITALS — BP 130/81 | HR 94 | Temp 98.0°F | Resp 16 | Ht 68.0 in | Wt 225.0 lb

## 2024-05-24 DIAGNOSIS — S22080S Wedge compression fracture of T11-T12 vertebra, sequela: Secondary | ICD-10-CM | POA: Diagnosis not present

## 2024-05-24 DIAGNOSIS — S32010S Wedge compression fracture of first lumbar vertebra, sequela: Secondary | ICD-10-CM

## 2024-05-24 DIAGNOSIS — M5136 Other intervertebral disc degeneration, lumbar region with discogenic back pain only: Secondary | ICD-10-CM

## 2024-05-24 DIAGNOSIS — G894 Chronic pain syndrome: Secondary | ICD-10-CM

## 2024-05-24 DIAGNOSIS — M47816 Spondylosis without myelopathy or radiculopathy, lumbar region: Secondary | ICD-10-CM | POA: Diagnosis not present

## 2024-05-24 DIAGNOSIS — Z992 Dependence on renal dialysis: Secondary | ICD-10-CM

## 2024-05-24 DIAGNOSIS — Z720 Tobacco use: Secondary | ICD-10-CM | POA: Diagnosis not present

## 2024-05-24 DIAGNOSIS — N186 End stage renal disease: Secondary | ICD-10-CM

## 2024-05-24 DIAGNOSIS — F17200 Nicotine dependence, unspecified, uncomplicated: Secondary | ICD-10-CM

## 2024-05-24 DIAGNOSIS — M170 Bilateral primary osteoarthritis of knee: Secondary | ICD-10-CM | POA: Diagnosis not present

## 2024-05-24 MED ORDER — ACETAMINOPHEN 500 MG PO TABS: 500.0000 mg | ORAL_TABLET | Freq: Four times a day (QID) | ORAL | 2 refills | Status: AC | PRN
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
## 2024-05-24 NOTE — Progress Notes (Signed)
 Safety precautions to be maintained throughout the outpatient stay will include: orient to surroundings, keep bed in low position, maintain call bell within reach at all times, provide assistance with transfer out of bed and ambulation.

## 2024-05-24 NOTE — Patient Instructions (Signed)
 ______________________________________________________________________    General Risks and Possible Complications  Patient Responsibilities: It is important that you read this as it is part of your informed consent. It is our duty to inform you of the risks and possible complications associated with treatments offered to you. It is your responsibility as a patient to read this and to ask questions about anything that is not clear or that you believe was not covered in this document.  Patient's Rights: You have the right to refuse treatment. You also have the right to change your mind, even after initially having agreed to have the treatment done. However, under this last option, if you wait until the last second to change your mind, you may be charged for the materials used up to that point.  Introduction: Medicine is not an Visual merchandiser. Everything in Medicine, including the lack of treatment(s), carries the potential for danger, harm, or loss (which is by definition: Risk). In Medicine, a complication is a secondary problem, condition, or disease that can aggravate an already existing one. All treatments carry the risk of possible complications. The fact that a side effects or complications occurs, does not imply that the treatment was conducted incorrectly. It must be clearly understood that these can happen even when everything is done following the highest safety standards.  No treatment: You can choose not to proceed with the proposed treatment alternative. The "PRO(s)" would include: avoiding the risk of complications associated with the therapy. The "CON(s)" would include: not getting any of the treatment benefits. These benefits fall under one of three categories: diagnostic; therapeutic; and/or palliative. Diagnostic benefits include: getting information which can ultimately lead to improvement of the disease or symptom(s). Therapeutic benefits are those associated with the successful  treatment of the disease. Finally, palliative benefits are those related to the decrease of the primary symptoms, without necessarily curing the condition (example: decreasing the pain from a flare-up of a chronic condition, such as incurable terminal cancer).  General Risks and Complications: These are associated to most interventional treatments. They can occur alone, or in combination. They fall under one of the following six (6) categories: no benefit or worsening of symptoms; bleeding; infection; nerve damage; allergic reactions; and/or death. No benefits or worsening of symptoms: In Medicine there are no guarantees, only probabilities. No healthcare provider can ever guarantee that a medical treatment will work, they can only state the probability that it may. Furthermore, there is always the possibility that the condition may worsen, either directly, or indirectly, as a consequence of the treatment. Bleeding: This is more common if the patient is taking a blood thinner, either prescription or over the counter (example: Goody Powders, Fish oil, Aspirin, Garlic, etc.), or if suffering a condition associated with impaired coagulation (example: Hemophilia, cirrhosis of the liver, low platelet counts, etc.). However, even if you do not have one on these, it can still happen. If you have any of these conditions, or take one of these drugs, make sure to notify your treating physician. Infection: This is more common in patients with a compromised immune system, either due to disease (example: diabetes, cancer, human immunodeficiency virus [HIV], etc.), or due to medications or treatments (example: therapies used to treat cancer and rheumatological diseases). However, even if you do not have one on these, it can still happen. If you have any of these conditions, or take one of these drugs, make sure to notify your treating physician. Nerve Damage: This is more common when the treatment is  an invasive one, but it  can also happen with the use of medications, such as those used in the treatment of cancer. The damage can occur to small secondary nerves, or to large primary ones, such as those in the spinal cord and brain. This damage may be temporary or permanent and it may lead to impairments that can range from temporary numbness to permanent paralysis and/or brain death. Allergic Reactions: Any time a substance or material comes in contact with our body, there is the possibility of an allergic reaction. These can range from a mild skin rash (contact dermatitis) to a severe systemic reaction (anaphylactic reaction), which can result in death. Death: In general, any medical intervention can result in death, most of the time due to an unforeseen complication. ______________________________________________________________________      ______________________________________________________________________    Preparing for your procedure  Appointments: If you think you may not be able to keep your appointment, call 24-48 hours in advance to cancel. We need time to make it available to others.  Procedure visits are for procedures only. During your procedure appointment there will be: NO Prescription Refills*. NO medication changes or discussions*. NO discussion of disability issues*. NO unrelated pain problem evaluations*. NO evaluations to order other pain procedures*. *These will be addressed at a separate and distinct evaluation encounter on the provider's evaluation schedule and not during procedure days.  Instructions: Food intake: Avoid eating anything solid for at least 8 hours prior to your procedure. Clear liquid intake: You may take clear liquids such as water up to 2 hours prior to your procedure. (No carbonated drinks. No soda.) Transportation: Unless otherwise stated by your physician, bring a driver. (Driver cannot be a Market researcher, Pharmacist, community, or any other form of public transportation.) Morning  Medicines: Except for blood thinners, take all of your other morning medications with a sip of water. Make sure to take your heart and blood pressure medicines. If your blood pressure's lower number is above 100, the case will be rescheduled. Blood thinners: Make sure to stop your blood thinners as instructed.  If you take a blood thinner, but were not instructed to stop it, call our office [phone] and ask to talk to a nurse. Not stopping a blood thinner prior to certain procedures could lead to serious complications. Diabetics on insulin: Notify the staff so that you can be scheduled 1st case in the morning. If your diabetes requires high dose insulin, take only  of your normal insulin dose the morning of the procedure and notify the staff that you have done so. Preventing infections: Shower with an antibacterial soap the morning of your procedure.  Build-up your immune system: Take 1000 mg of Vitamin C with every meal (3 times a day) the day prior to your procedure. Antibiotics: Inform the nursing staff if you are taking any antibiotics or if you have any conditions that may require antibiotics prior to procedures. (Example: recent joint implants)   Pregnancy: If you are pregnant make sure to notify the nursing staff. Not doing so may result in injury to the fetus, including death.  Sickness: If you have a cold, fever, or any active infections, call and cancel or reschedule your procedure. Receiving steroids while having an infection may result in complications. Arrival: You must be in the facility at least 30 minutes prior to your scheduled procedure. Tardiness: Your scheduled time is also the cutoff time. If you do not arrive at least 15 minutes prior to your procedure, you will  be rescheduled.  Children: Do not bring any children with you. Make arrangements to keep them home. Dress appropriately: There is always a possibility that your clothing may get soiled. Avoid long dresses. Valuables:  Do not bring any jewelry or valuables.  Reasons to call and reschedule or cancel your procedure: (Following these recommendations will minimize the risk of a serious complication.) Surgeries: Avoid having procedures within 2 weeks of any surgery. (Avoid for 2 weeks before or after any surgery). Flu Shots: Avoid having procedures within 2 weeks of a flu shots or . (Avoid for 2 weeks before or after immunizations). Barium: Avoid having a procedure within 7-10 days after having had a radiological study involving the use of radiological contrast. (Myelograms, Barium swallow or enema study). Heart attacks: Avoid any elective procedures or surgeries for the initial 6 months after a "Myocardial Infarction" (Heart Attack). Blood thinners: It is imperative that you stop these medications before procedures. Let us know if you if you take any blood thinner.  Infection: Avoid procedures during or within two weeks of an infection (including chest colds or gastrointestinal problems). Symptoms associated with infections include: Localized redness, fever, chills, night sweats or profuse sweating, burning sensation when voiding, cough, congestion, stuffiness, runny nose, sore throat, diarrhea, nausea, vomiting, cold or Flu symptoms, recent or current infections. It is specially important if the infection is over the area that we intend to treat. Heart and lung problems: Symptoms that may suggest an active cardiopulmonary problem include: cough, chest pain, breathing difficulties or shortness of breath, dizziness, ankle swelling, uncontrolled high or unusually low blood pressure, and/or palpitations. If you are experiencing any of these symptoms, cancel your procedure and contact your primary care physician for an evaluation.  Remember:  Regular Business hours are:  Monday to Thursday 8:00 AM to 4:00 PM  Provider's Schedule: Delano Metz, MD:  Procedure days: Tuesday and Thursday 7:30 AM to 4:00 PM  Edward Jolly, MD:  Procedure days: Monday and Wednesday 7:30 AM to 4:00 PM Last  Updated: 04/07/2023 ______________________________________________________________________     Facet Blocks Patient Information  Description: The facets are joints in the spine between the vertebrae.  Like any joints in the body, facets can become irritated and painful.  Arthritis can also effect the facets.  By injecting steroids and local anesthetic in and around these joints, we can temporarily block the nerve supply to them.  Steroids act directly on irritated nerves and tissues to reduce selling and inflammation which often leads to decreased pain.  Facet blocks may be done anywhere along the spine from the neck to the low back depending upon the location of your pain.   After numbing the skin with local anesthetic (like Novocaine), a small needle is passed onto the facet joints under x-ray guidance.  You may experience a sensation of pressure while this is being done.  The entire block usually lasts about 15-25 minutes.   Conditions which may be treated by facet blocks:  Low back/buttock pain Neck/shoulder pain Certain types of headaches  Preparation for the injection:  Do not eat any solid food or dairy products within 8 hours of your appointment. You may drink clear liquid up to 3 hours before appointment.  Clear liquids include water, black coffee, juice or soda.  No milk or cream please. You may take your regular medication, including pain medications, with a sip of water before your appointment.  Diabetics should hold regular insulin (if taken separately) and take 1/2 normal NPH dose  the morning of the procedure.  Carry some sugar containing items with you to your appointment. A driver must accompany you and be prepared to drive you home after your procedure. Bring all your current medications with you. An IV may be inserted and sedation may be given at the discretion of the physician. A blood pressure  cuff, EKG and other monitors will often be applied during the procedure.  Some patients may need to have extra oxygen administered for a short period. You will be asked to provide medical information, including your allergies and medications, prior to the procedure.  We must know immediately if you are taking blood thinners (like Coumadin/Warfarin) or if you are allergic to IV iodine contrast (dye).  We must know if you could possible be pregnant.  Possible side-effects:  Bleeding from needle site Infection (rare, may require surgery) Nerve injury (rare) Numbness & tingling (temporary) Difficulty urinating (rare, temporary) Spinal headache (a headache worse with upright posture) Light-headedness (temporary) Pain at injection site (serveral days) Decreased blood pressure (rare, temporary) Weakness in arm/leg (temporary) Pressure sensation in back/neck (temporary)   Call if you experience:  Fever/chills associated with headache or increased back/neck pain Headache worsened by an upright position New onset, weakness or numbness of an extremity below the injection site Hives or difficulty breathing (go to the emergency room) Inflammation or drainage at the injection site(s) Severe back/neck pain greater than usual New symptoms which are concerning to you  Please note:  Although the local anesthetic injected can often make your back or neck feel good for several hours after the injection, the pain will likely return. It takes 3-7 days for steroids to work.  You may not notice any pain relief for at least one week.  If effective, we will often do a series of 2-3 injections spaced 3-6 weeks apart to maximally decrease your pain.  After the initial series, you may be a candidate for a more permanent nerve block of the facets.  If you have any questions, please call [phone] [facility]

## 2024-05-26 ENCOUNTER — Telehealth: Payer: Self-pay | Admitting: Nurse Practitioner

## 2024-05-26 DIAGNOSIS — M25561 Pain in right knee: Secondary | ICD-10-CM

## 2024-05-26 DIAGNOSIS — G8929 Other chronic pain: Secondary | ICD-10-CM

## 2024-05-31 ENCOUNTER — Telehealth: Payer: Self-pay | Admitting: Nurse Practitioner

## 2024-05-31 NOTE — Telephone Encounter (Signed)
 Pain Management referral sent via [facility] to Dr. Etta w/ [facility]. Notified patient. He already had telephone #-[facility]

## 2024-06-01 NOTE — Telephone Encounter (Signed)
 error

## 2024-06-02 ENCOUNTER — Telehealth: Payer: Self-pay | Admitting: Nurse Practitioner

## 2024-06-02 NOTE — Telephone Encounter (Signed)
 Pain Management appointment 06/02/2024 with Dr. Etta @ [email or web address]

## 2024-08-04 ENCOUNTER — Ambulatory Visit: Admitting: Nurse Practitioner
